# Patient Record
Sex: Male | Born: 1955
Health system: Southern US, Community
[De-identification: ages and names within clinical notes are randomized; demographics above are authoritative.]

## PROBLEM LIST (undated history)

## (undated) DIAGNOSIS — J479 Bronchiectasis, uncomplicated: Secondary | ICD-10-CM

## (undated) DIAGNOSIS — I700 Atherosclerosis of aorta: Secondary | ICD-10-CM

## (undated) DIAGNOSIS — E785 Hyperlipidemia, unspecified: Secondary | ICD-10-CM

## (undated) DIAGNOSIS — G4730 Sleep apnea, unspecified: Secondary | ICD-10-CM

## (undated) DIAGNOSIS — J302 Other seasonal allergic rhinitis: Secondary | ICD-10-CM

## (undated) DIAGNOSIS — K219 Gastro-esophageal reflux disease without esophagitis: Secondary | ICD-10-CM

## (undated) DIAGNOSIS — L570 Actinic keratosis: Secondary | ICD-10-CM

## (undated) DIAGNOSIS — E119 Type 2 diabetes mellitus without complications: Secondary | ICD-10-CM

## (undated) DIAGNOSIS — I2510 Atherosclerotic heart disease of native coronary artery without angina pectoris: Secondary | ICD-10-CM

## (undated) DIAGNOSIS — I10 Essential (primary) hypertension: Secondary | ICD-10-CM

## (undated) DIAGNOSIS — Z860101 Personal history of adenomatous and serrated colon polyps: Secondary | ICD-10-CM

## (undated) DIAGNOSIS — T7840XA Allergy, unspecified, initial encounter: Secondary | ICD-10-CM

## (undated) DIAGNOSIS — M5430 Sciatica, unspecified side: Secondary | ICD-10-CM

## (undated) DIAGNOSIS — K2970 Gastritis, unspecified, without bleeding: Secondary | ICD-10-CM

## (undated) DIAGNOSIS — C801 Malignant (primary) neoplasm, unspecified: Secondary | ICD-10-CM

## (undated) DIAGNOSIS — M1990 Unspecified osteoarthritis, unspecified site: Secondary | ICD-10-CM

## (undated) HISTORY — PX: CHOLECYSTECTOMY: SHX55

## (undated) HISTORY — PX: OTHER SURGICAL HISTORY: SHX169

## (undated) HISTORY — DX: Type 2 diabetes mellitus without complications: E11.9

## (undated) HISTORY — DX: Allergy, unspecified, initial encounter: T78.40XA

## (undated) HISTORY — PX: ESOPHAGOGASTRODUODENOSCOPY: SHX1529

## (undated) HISTORY — PX: CARDIAC CATHETERIZATION: SHX172

## (undated) HISTORY — DX: Actinic keratosis: L57.0

## (undated) HISTORY — PX: VASECTOMY: SHX75

## (undated) HISTORY — PX: HERNIA REPAIR: SHX51

---

## 2009-01-25 ENCOUNTER — Observation Stay: Payer: Self-pay | Admitting: Internal Medicine

## 2009-05-12 ENCOUNTER — Ambulatory Visit: Payer: Self-pay | Admitting: Internal Medicine

## 2009-06-02 ENCOUNTER — Ambulatory Visit: Payer: Self-pay | Admitting: Surgery

## 2009-06-09 ENCOUNTER — Ambulatory Visit: Payer: Self-pay | Admitting: Surgery

## 2010-06-08 DIAGNOSIS — C4492 Squamous cell carcinoma of skin, unspecified: Secondary | ICD-10-CM

## 2010-06-08 HISTORY — DX: Squamous cell carcinoma of skin, unspecified: C44.92

## 2010-08-24 ENCOUNTER — Emergency Department: Payer: Self-pay | Admitting: Emergency Medicine

## 2011-12-26 ENCOUNTER — Encounter: Payer: Self-pay | Admitting: Physician Assistant

## 2011-12-27 ENCOUNTER — Encounter: Payer: Self-pay | Admitting: Physician Assistant

## 2012-06-21 ENCOUNTER — Ambulatory Visit: Payer: Self-pay | Admitting: Unknown Physician Specialty

## 2012-06-25 LAB — PATHOLOGY REPORT

## 2015-03-04 NOTE — Patient Outreach (Signed)
[place] [facility]) Care Management  03/04/2015  TORIN MODICA 1956-04-06 [id number]   A1C drawn on 12/16/14 at [facility]- 6.8%.    Gentry Fitz, RN, BA, [place], [place] Direct Dial:  [phone]  Fax:  [phone] E-mail: [email or web address] [address], [place], [place]  [address]

## 2015-04-27 ENCOUNTER — Telehealth: Payer: Self-pay

## 2015-05-07 ENCOUNTER — Other Ambulatory Visit: Payer: Self-pay | Admitting: Pharmacist

## 2015-05-07 ENCOUNTER — Encounter: Payer: Self-pay | Admitting: Pharmacist

## 2015-05-07 NOTE — Patient Outreach (Signed)
[place] [place] [facility]) Care Management  [place]   05/07/2015  Danny Maldonado 28-Jan-1956 [id number]  Subjective: Danny Maldonado is a 59 year old male who is participating in the Employee Link to [employer].  He presents for his 6 month follow up.  He reports he is currently not following any meal plan or special diet.  He reports a diet high in carbohydrates (hash browns, french fries, chips, pasta, chocolate).  His current exercise regimen consists of walking 2 laps in the park (1.5 miles) four days per week.  He is currently walking this distance in 32 to 35 minutes.  His goal is to increase walking to 3 laps (2.25 miles) per day; however, he first wants to be able to walk 2 laps in 30 minutes or less.  His current diabetes regimen includes metformin 1000 mg BID and glimepiride 2 mg daily.  Patient monitors his BG twice daily.  He denies any hypoglycemic episodes.  Patient presents to appointment with his glucometer today.  His 30-day BG average is 134mg /dL.  Patient also continues on daily ASA and ACE inhibitor.  He is not on a statin.  Patient states his PCP has discussed statin therapy before, but patient reports his lipid panel is well controlled.  Discussed the benefit of statin therapy in ASCVD risk reduction.  Most recent MD follow-up was in August 2016.  Next PCP appointment scheduled in February 2017.    Objective:  Blood Pressure: 120/80  Current Medications: Current Outpatient Prescriptions  Medication Sig Dispense Refill  . acetaminophen (TYLENOL) 500 MG tablet Take 1,000 mg by mouth every 6 (six) hours as needed (PRN - takes approximately once per week).    Marland Kitchen aspirin EC 81 MG tablet Take 81 mg by mouth daily.    . cetirizine (ZYRTEC) 10 MG tablet Take 10 mg by mouth daily.    . Cyanocobalamin 500 MCG SUBL Place 1,000 mcg under the tongue daily.    . fluticasone (FLONASE) 50 MCG/ACT nasal spray Place 2 sprays into both nostrils daily.    Marland Kitchen glimepiride (AMARYL)  2 MG tablet Take 2 mg by mouth daily with breakfast.    . lisinopril (PRINIVIL,ZESTRIL) 10 MG tablet Take 10 mg by mouth daily.    Marland Kitchen loratadine (CLARITIN) 10 MG tablet Take 10 mg by mouth daily.    . magnesium oxide (MAG-OX) 400 MG tablet Take 400 mg by mouth daily.    . metFORMIN (GLUCOPHAGE) 1000 MG tablet Take 1,000 mg by mouth 2 (two) times daily with a meal.    . Multiple Vitamin (MULTIVITAMIN) tablet Take 1 tablet by mouth daily.    . naproxen sodium (ANAPROX) 220 MG tablet Take 440 mg by mouth daily as needed (PRN - takes approximately once per month).    Marland Kitchen omeprazole (PRILOSEC) 20 MG capsule Take 20 mg by mouth daily.     No current facility-administered medications for this visit.   Functional Status: In your present state of health, do you have any difficulty performing the following activities: 05/07/2015  Hearing? Y  Vision? N  Difficulty concentrating or making decisions? N  Walking or climbing stairs? N  Dressing or bathing? N  Doing errands, shopping? N   Fall/Depression Screening: PHQ 2/9 Scores 05/07/2015  PHQ - 2 Score 0    Assessment: 1.  Diabetes: Most recent A1C was 6.9% which is at goal of less than 7%.  Patient is not currently on statin therapy and may benefit from the addition of  a statin for ASCVD risk reduction.  However, patient does not seem interested in initiation of statin therapy at this time.  Patient is currently exercising approximately 160 minutes per week.  I encouraged patient to continue his walking regimen.  Patient may benefit from reduction in carbohydrate intake.  We discussed diet and healthy carbohydrate choices for diabetic patients.  I encouraged patient to try to work toward reducing the number of carbohydrates in his diet.    Plan: 1.  Patient will continue to take his medications as prescribed.   2.  Danny Maldonado will work toward his goal to walk 2 laps (1.5 miles) in 30 minutes or less.  3.  He will work to reduce the amount of carbohydrates  in his diet by first reducing the amount of chocolate he eats.  4.  Follow-up appointment scheduled for 11/05/15 at [facility] Problem One        Most Recent Value   Care Plan Problem One  Diabetes control    Role Documenting the Problem One  Clinical Pharmacist   Care Plan for Problem One  Active   THN Long Term Goal (31-90 days)  Maintain A1c less than 7% in the next 90 days per patient report.     THN Long Term Goal Start Date  05/07/15   Interventions for Problem One Long Term Goal  Discussed diet and exercise to improve weight and blood sugar.      [facility] CM Care Plan Problem Two        Most Recent Value   Care Plan Problem Two  Exercise   Role Documenting the Problem Two  Clinical Pharmacist   Care Plan for Problem Two  Active   Interventions for Problem Two Long Term Goal   Discussed the importance of exercise in weight management.    THN Long Term Goal (31-90) days  Patient will be able to walk two laps at the park (1.5 miles) in less than 30 minutes in the next 90 days per patient report.    THN Long Term Goal Start Date  05/07/15    [facility] CM Care Plan Problem Three        Most Recent Value   Care Plan Problem Three  Diet   Role Documenting the Problem Three  Clinical Pharmacist   Care Plan for Problem Three  Active   THN Long Term Goal (31-90) days  Patient will work on cutting out or reducing one carbohydrate choice in his diet in the next 90 days per patient report.    THN Long Term Goal Start Date  05/07/15   Interventions for Problem Three Long Term Goal  Discussed importance of diet on weight management and blood glucose control.  Patient will work toward reducing the amount of chocolate he eats.       Elisabeth Most, Pharm.D. Pharmacy Resident [place]

## 2015-05-10 ENCOUNTER — Encounter: Payer: Self-pay | Admitting: Pharmacist

## 2015-09-08 DIAGNOSIS — Z1283 Encounter for screening for malignant neoplasm of skin: Secondary | ICD-10-CM | POA: Diagnosis not present

## 2015-09-08 DIAGNOSIS — D229 Melanocytic nevi, unspecified: Secondary | ICD-10-CM | POA: Diagnosis not present

## 2015-09-08 DIAGNOSIS — Z85828 Personal history of other malignant neoplasm of skin: Secondary | ICD-10-CM | POA: Diagnosis not present

## 2015-09-08 DIAGNOSIS — D1800 Hemangioma unspecified site: Secondary | ICD-10-CM | POA: Diagnosis not present

## 2015-09-08 DIAGNOSIS — L821 Other seborrheic keratosis: Secondary | ICD-10-CM | POA: Diagnosis not present

## 2015-09-08 DIAGNOSIS — L304 Erythema intertrigo: Secondary | ICD-10-CM | POA: Diagnosis not present

## 2015-09-08 DIAGNOSIS — L853 Xerosis cutis: Secondary | ICD-10-CM | POA: Diagnosis not present

## 2015-09-08 DIAGNOSIS — L918 Other hypertrophic disorders of the skin: Secondary | ICD-10-CM | POA: Diagnosis not present

## 2015-09-08 DIAGNOSIS — L812 Freckles: Secondary | ICD-10-CM | POA: Diagnosis not present

## 2015-09-27 DIAGNOSIS — E119 Type 2 diabetes mellitus without complications: Secondary | ICD-10-CM | POA: Diagnosis not present

## 2015-09-27 DIAGNOSIS — E538 Deficiency of other specified B group vitamins: Secondary | ICD-10-CM | POA: Diagnosis not present

## 2015-09-27 DIAGNOSIS — E7800 Pure hypercholesterolemia, unspecified: Secondary | ICD-10-CM | POA: Diagnosis not present

## 2015-09-27 DIAGNOSIS — Z79899 Other long term (current) drug therapy: Secondary | ICD-10-CM | POA: Diagnosis not present

## 2015-10-04 DIAGNOSIS — M79601 Pain in right arm: Secondary | ICD-10-CM | POA: Diagnosis not present

## 2015-10-04 DIAGNOSIS — M546 Pain in thoracic spine: Secondary | ICD-10-CM | POA: Diagnosis not present

## 2015-10-04 DIAGNOSIS — I10 Essential (primary) hypertension: Secondary | ICD-10-CM | POA: Diagnosis not present

## 2015-10-04 DIAGNOSIS — R202 Paresthesia of skin: Secondary | ICD-10-CM | POA: Diagnosis not present

## 2015-10-04 DIAGNOSIS — M79602 Pain in left arm: Secondary | ICD-10-CM | POA: Diagnosis not present

## 2015-10-04 DIAGNOSIS — E7800 Pure hypercholesterolemia, unspecified: Secondary | ICD-10-CM | POA: Diagnosis not present

## 2015-10-04 DIAGNOSIS — E119 Type 2 diabetes mellitus without complications: Secondary | ICD-10-CM | POA: Diagnosis not present

## 2015-10-13 DIAGNOSIS — R55 Syncope and collapse: Secondary | ICD-10-CM | POA: Diagnosis not present

## 2015-11-05 ENCOUNTER — Ambulatory Visit: Payer: Self-pay | Admitting: Pharmacist

## 2015-12-03 ENCOUNTER — Encounter: Payer: Self-pay | Admitting: Pharmacist

## 2015-12-03 ENCOUNTER — Other Ambulatory Visit: Payer: Self-pay | Admitting: Pharmacist

## 2015-12-03 NOTE — Patient Outreach (Signed)
[place] [facility]) Care Management  [place]   12/03/2015  BAYRON DALTO 1956/06/04 [id number]  Subjective: Patient presents today for 6 month diabetes follow-up as part of the employer-sponsored Link to Wellness program.  Current diabetes regimen includes metformin and glimepiride.  Patient reports he is not currently following any meal plan or special diet.  He continues to report a diet high in carbohydrates (tater tots, hash browns, pasta, chocolate).  He reports exercising 3 days per week by walking two laps around a track (total distance ~1.5 miles).  Patient reports monitoring blood glucose twice per day.  Patient presents to visit without glucometer, but reports average FBG of 150 - 160 mg/dL and average 2 hour PPG of 130 to 160 mg/dL.  Patient denies hypoglycemia.  Patient also continues on daily ASA and ACE Inhibitor.  Patient is not currently on a statin.  Most recent MD follow-up was 10/04/15.  Patient has a pending appt for 04/05/16.  No med changes or major health changes at this time.   Objective:  Blood pressure = 129/81  Encounter Medications: Outpatient Encounter Prescriptions as of 12/03/2015  Medication Sig  . acetaminophen (TYLENOL) 500 MG tablet Take 1,000 mg by mouth every 6 (six) hours as needed (PRN - takes approximately once per week).  Marland Kitchen aspirin EC 81 MG tablet Take 81 mg by mouth daily.  . cetirizine (ZYRTEC) 10 MG tablet Take 10 mg by mouth daily.  . Cyanocobalamin 500 MCG SUBL Place 1,000 mcg under the tongue daily.  . fluticasone (FLONASE) 50 MCG/ACT nasal spray Place 2 sprays into both nostrils daily.  Marland Kitchen glimepiride (AMARYL) 2 MG tablet Take 2 mg by mouth daily with breakfast.  . lisinopril (PRINIVIL,ZESTRIL) 10 MG tablet Take 10 mg by mouth daily.  Marland Kitchen loratadine (CLARITIN) 10 MG tablet Take 10 mg by mouth daily.  . magnesium oxide (MAG-OX) 400 MG tablet Take 400 mg by mouth daily.  . metFORMIN (GLUCOPHAGE) 1000 MG tablet Take 1,000 mg by mouth  2 (two) times daily with a meal.  . Multiple Vitamin (MULTIVITAMIN) tablet Take 1 tablet by mouth daily.  Marland Kitchen omeprazole (PRILOSEC) 20 MG capsule Take 20 mg by mouth daily.  . [DISCONTINUED] naproxen sodium (ANAPROX) 220 MG tablet Take 440 mg by mouth daily as needed (PRN - takes approximately once per month). Reported on 12/03/2015   No facility-administered encounter medications on file as of 12/03/2015.   Functional Status: In your present state of health, do you have any difficulty performing the following activities: 12/03/2015 05/07/2015  Hearing? N Y  Vision? N N  Difficulty concentrating or making decisions? N N  Walking or climbing stairs? N N  Dressing or bathing? N N  Doing errands, shopping? N N   Fall/Depression Screening: PHQ 2/9 Scores 12/03/2015 05/07/2015  PHQ - 2 Score 0 0    Assessment: 1.  Diabetes: Most recent A1C was 6.9% which is at goal of less than 7%. Weight is stable from last visit with me.  Patient reports adherence with diabetes medications.  Patient denies hypoglycemic events and is able to verbalize appropriate hypoglycemia management plan.    Discussed importance of diet and exercise in diabetes management.  Patient reports exercising 3 days per week for approximately 30 to 35 minutes per day.  Encouraged patient to continue to exercise and aim for a goal of 150 minutes per week.  Patient may benefit from reduction in carbohydrate intake.  We discussed diet and healthy carbohydrate choices for diabetic  patients.  I encouraged patient to try to work toward reducing the number of carbohydrates in his diet.    Patient has 10 year ASCVD risk of 17.5%.  High intensity statin therapy indicated due to diabetes and ASCVD risk greater than 7.5%.    Plan: 1.  Patient will continue to take his medications as prescribed.   2.  Patient will continue to walk three days per week with a goal to exercise at least 150 minutes per week.  3.  Patient will work to reduce the amount of  carbohydrates in his diet by working on [employer].  4.  I will send recommendation to patient's PCP to consider initiation of high intensity statin therapy if clinically appropriate.  4.  Follow up appointment scheduled for 06/02/16.    THN CM Care Plan Problem One        Most Recent Value   Care Plan Problem One  Diabetes control    Role Documenting the Problem One  Clinical Pharmacist   Care Plan for Problem One  Active   THN Long Term Goal (31-90 days)  Maintain A1c less than 7% in the next 90 days per patient report.     THN Long Term Goal Start Date  05/07/15   THN Long Term Goal Met Date  12/03/15   Interventions for Problem One Long Term Goal  Most recent A1c on 09/27/15 was stable at 6.9%    [facility] CM Care Plan Problem Two        Most Recent Value   Care Plan Problem Two  Exercise   Role Documenting the Problem Two  Clinical Pharmacist   Care Plan for Problem Two  Active   Interventions for Problem Two Long Term Goal   Patient continues to walk around the track two laps three days per week. Discussed [name] recommendations to exercise 150 mintues per week.    THN Long Term Goal (31-90) days  Patient will exercise 150 minutes per week in the next 90 days per patient report.    THN Long Term Goal Start Date  12/03/15   THN Long Term Goal Met Date  12/03/15    [facility] CM Care Plan Problem Three        Most Recent Value   Care Plan Problem Three  Diet   Role Documenting the Problem Three  Clinical Pharmacist   Care Plan for Problem Three  Active   THN Long Term Goal (31-90) days  Patient will work on cutting out or reducing one carbohydrate choice in his diet in the next 90 days per patient report.    THN Long Term Goal Start Date  05/07/15   THN Long Term Goal Met Date  -- [goal renewed]   Interventions for Problem Three Long Term Goal  Discuss importance of diet on weight management and blood glucose control.  Patient will work toward reducing carbohydrates in diet.        Elisabeth Most, Pharm.D. Pharmacy Resident [place] [phone]

## 2016-04-05 DIAGNOSIS — Z79899 Other long term (current) drug therapy: Secondary | ICD-10-CM | POA: Diagnosis not present

## 2016-04-05 DIAGNOSIS — R739 Hyperglycemia, unspecified: Secondary | ICD-10-CM | POA: Diagnosis not present

## 2016-04-05 DIAGNOSIS — Z125 Encounter for screening for malignant neoplasm of prostate: Secondary | ICD-10-CM | POA: Diagnosis not present

## 2016-04-05 DIAGNOSIS — E7800 Pure hypercholesterolemia, unspecified: Secondary | ICD-10-CM | POA: Diagnosis not present

## 2016-04-05 DIAGNOSIS — I10 Essential (primary) hypertension: Secondary | ICD-10-CM | POA: Diagnosis not present

## 2016-04-24 DIAGNOSIS — E119 Type 2 diabetes mellitus without complications: Secondary | ICD-10-CM | POA: Diagnosis not present

## 2016-04-24 DIAGNOSIS — Z0000 Encounter for general adult medical examination without abnormal findings: Secondary | ICD-10-CM | POA: Diagnosis not present

## 2016-04-24 DIAGNOSIS — I10 Essential (primary) hypertension: Secondary | ICD-10-CM | POA: Diagnosis not present

## 2016-05-10 DIAGNOSIS — R55 Syncope and collapse: Secondary | ICD-10-CM | POA: Diagnosis not present

## 2016-05-10 DIAGNOSIS — Z7689 Persons encountering health services in other specified circumstances: Secondary | ICD-10-CM | POA: Diagnosis not present

## 2016-05-10 DIAGNOSIS — I10 Essential (primary) hypertension: Secondary | ICD-10-CM | POA: Diagnosis not present

## 2016-05-10 DIAGNOSIS — Z9889 Other specified postprocedural states: Secondary | ICD-10-CM | POA: Insufficient documentation

## 2016-05-10 DIAGNOSIS — E7800 Pure hypercholesterolemia, unspecified: Secondary | ICD-10-CM | POA: Diagnosis not present

## 2016-05-15 DIAGNOSIS — I10 Essential (primary) hypertension: Secondary | ICD-10-CM | POA: Diagnosis not present

## 2016-05-15 DIAGNOSIS — E119 Type 2 diabetes mellitus without complications: Secondary | ICD-10-CM | POA: Diagnosis not present

## 2016-05-15 DIAGNOSIS — R55 Syncope and collapse: Secondary | ICD-10-CM | POA: Diagnosis not present

## 2016-05-15 DIAGNOSIS — E7800 Pure hypercholesterolemia, unspecified: Secondary | ICD-10-CM | POA: Diagnosis not present

## 2016-05-19 DIAGNOSIS — Z23 Encounter for immunization: Secondary | ICD-10-CM | POA: Diagnosis not present

## 2016-05-29 DIAGNOSIS — R55 Syncope and collapse: Secondary | ICD-10-CM | POA: Diagnosis not present

## 2016-06-01 DIAGNOSIS — G4733 Obstructive sleep apnea (adult) (pediatric): Secondary | ICD-10-CM | POA: Diagnosis not present

## 2016-06-02 ENCOUNTER — Ambulatory Visit: Payer: 59 | Admitting: Pharmacist

## 2016-06-16 ENCOUNTER — Other Ambulatory Visit: Payer: Self-pay | Admitting: Pharmacist

## 2016-06-16 ENCOUNTER — Encounter: Payer: Self-pay | Admitting: Pharmacist

## 2016-06-16 NOTE — Patient Outreach (Signed)
[facility] [facility]) Care Management  [place]   06/16/2016  TEION BALLIN [birth year] [id number]  Subjective: Patient presents today for diabetes follow-up as part of the employer-sponsored Link to Wellness program.  Current diabetes regimen includes metformin 1000 mg BID and glimepiride 2 mg daily.  Patient also continues on daily aspirin and lisinopril.  Most recent MD follow-up was 03/2016.  He does report 2 episodes where had dizziness, lightheaded and disoriented while walking but his blood sugars were not low and his blood pressure was also in normal range. Dr Doy Hutching has sent him to a cardiologist with a stress test and has found no issues.   Patient reported dietary habits: Eats 3 meals/day Breakfast:hashbrown with sausage or eggs Lunch:pasta or pizza Dinner: Varies - hamburger without the bread or baked breaded fish or tacos or meatloaf.  Sides usually mashed potatoes or mac and cheese or green beans. He does not like most cooked vegetables.  Snacks: occasional chocolate or cheese or cantalope Drinks: water or unsweet tea  Patient reported exercise habits: Walking 30 minutes (two 3/4 mile laps) per day for 2-3 days per week. Doesn't like to walk without his wife being there.  Does state that his back begins to hurt sometimes when walking.   Patient denies hypoglycemic events.  Patient reports nocturia 1 time per night.  Patient denies pain/burning upon urination.  Patient denies neuropathy. Patient denies visual changes. Has not seen eye doctor in 2017.  Patient denies self foot exams. Denies changes.  Last saw podiatry in 2016 and now has custom made orthotics.   Patient reported self monitored blood glucose frequency 2 times per day  Home fasting CBG: 30 day average 140 mg/dL  2 hour post-prandial/random CBG: 141 mg/dL   Objective:  Vitals:   06/16/16 1500  BP: 132/72  Pulse: 78   Hemoglobin A1C  04/05/2016 [place] Component  Name Value Ref Range  Hemoglobin A1C 7.1 (H) 4.2 - 5.6 %    Hemoglobin A1C (09/27/2015 7:06 AM) Hemoglobin A1C (09/27/2015 7:06 AM)  Component Value Ref Range  Hemoglobin A1C 6.9 (H) 4.2 - 5.6 %  Average Blood Glucose (Calc) 151 mg/dL   10 year ASCVD risk: 19.6%  Encounter Medications: Outpatient Encounter Prescriptions as of 06/16/2016  Medication Sig  . acetaminophen (TYLENOL) 500 MG tablet Take 1,000 mg by mouth every 6 (six) hours as needed (PRN - takes approximately once per week).  Marland Kitchen aspirin EC 81 MG tablet Take 81 mg by mouth daily.  . cetirizine (ZYRTEC) 10 MG tablet Take 10 mg by mouth every evening.   . Cyanocobalamin 500 MCG SUBL Place 1,000 mcg under the tongue daily.  . fluticasone (FLONASE) 50 MCG/ACT nasal spray Place 2 sprays into both nostrils daily.  Marland Kitchen glimepiride (AMARYL) 2 MG tablet Take 2 mg by mouth daily with breakfast.  . lisinopril (PRINIVIL,ZESTRIL) 10 MG tablet Take 10 mg by mouth daily.  Marland Kitchen loratadine (CLARITIN) 10 MG tablet Take 10 mg by mouth every morning.   . magnesium oxide (MAG-OX) 400 MG tablet Take 400 mg by mouth daily.  . metFORMIN (GLUCOPHAGE) 1000 MG tablet Take 1,000 mg by mouth 2 (two) times daily with a meal.  . Multiple Vitamin (MULTIVITAMIN) tablet Take 1 tablet by mouth daily.  Marland Kitchen omeprazole (PRILOSEC) 20 MG capsule Take 20 mg by mouth daily.   No facility-administered encounter medications on file as of 06/16/2016.     Functional Status: In your present state of health, do  you have any difficulty performing the following activities: 12/03/2015  Hearing? N  Vision? N  Difficulty concentrating or making decisions? N  Walking or climbing stairs? N  Dressing or bathing? N  Doing errands, shopping? N  Some recent data might be hidden    Fall/Depression Screening: PHQ 2/9 Scores 06/16/2016 12/03/2015 05/07/2015  PHQ - 2 Score 1 0 0     Assessment:  Diabetes: Most recent A1C was 7.1% which is above goal of less than 7%. Currently on  Aspirin and lisinopril. ASCVD 10 year risk of 19.6% and not currently on a statin with last LDL of 91 on 04/05/16.    Plan/Goals for Next Visit: -Encouraged patient to schedule yearly eye exam.  -Discussed low carbohydrate snacks and provided [name] Smart Snacks handout -Patient set goal to continue walking at least 3 days a week for 30 minutes or increase to a third lap around the track slowly as tolerated as measured by patient report -Will contact Dr Doy Hutching to consider a moderate-high intensity statin given patients diabetes and ASCVD 10 year risk of 19.6%.    Next appointment to see me is: 3-4 months    Bennye Alm, PharmD [facility] PGY2 Pharmacy Resident [phone]  [facility] CM Care Plan Problem Two   Flowsheet Row Most Recent Value  Care Plan Problem Two  Exercise  Role Documenting the Problem Two  Clinical Pharmacist  Care Plan for Problem Two  Active  Interventions for Problem Two Long Term Goal   Patient continues to walk around the track two laps three days per week. Discussed [name] recommendations to exercise 150 mintues per week.   THN Long Term Goal (31-90) days  Patient to continue walking at least 3 days a week for 30 minutes or increase to a third lap around the track slowly as tolerated as measured by patient report  [place] Term Goal Start Date  06/16/16    [facility] CM Care Plan Problem Three   Flowsheet Row Most Recent Value  Care Plan Problem Three  Diet  Role Documenting the Problem Three  Clinical Pharmacist  Care Plan for Problem Three  Active  THN Long Term Goal (31-90) days  Patient will work on cutting out or reducing one carbohydrate choice in his diet in the next 90 days per patient report.   THN Long Term Goal Start Date  05/07/15  THN Long Term Goal Met Date  -- [goal renewed]  Interventions for Problem Three Long Term Goal  Discuss importance of diet on weight management and blood glucose control.  Patient will work toward reducing carbohydrates in diet.

## 2016-06-27 DIAGNOSIS — I10 Essential (primary) hypertension: Secondary | ICD-10-CM | POA: Insufficient documentation

## 2016-06-27 DIAGNOSIS — G4730 Sleep apnea, unspecified: Secondary | ICD-10-CM | POA: Insufficient documentation

## 2016-06-27 DIAGNOSIS — E119 Type 2 diabetes mellitus without complications: Secondary | ICD-10-CM | POA: Insufficient documentation

## 2016-06-27 DIAGNOSIS — M1990 Unspecified osteoarthritis, unspecified site: Secondary | ICD-10-CM | POA: Insufficient documentation

## 2016-06-27 DIAGNOSIS — E785 Hyperlipidemia, unspecified: Secondary | ICD-10-CM | POA: Insufficient documentation

## 2016-07-25 DIAGNOSIS — E119 Type 2 diabetes mellitus without complications: Secondary | ICD-10-CM | POA: Diagnosis not present

## 2016-07-25 DIAGNOSIS — I10 Essential (primary) hypertension: Secondary | ICD-10-CM | POA: Diagnosis not present

## 2016-07-25 DIAGNOSIS — H524 Presbyopia: Secondary | ICD-10-CM | POA: Diagnosis not present

## 2016-07-25 DIAGNOSIS — H2513 Age-related nuclear cataract, bilateral: Secondary | ICD-10-CM | POA: Diagnosis not present

## 2016-07-25 DIAGNOSIS — H5203 Hypermetropia, bilateral: Secondary | ICD-10-CM | POA: Diagnosis not present

## 2016-07-25 DIAGNOSIS — H52223 Regular astigmatism, bilateral: Secondary | ICD-10-CM | POA: Diagnosis not present

## 2016-09-11 DIAGNOSIS — I10 Essential (primary) hypertension: Secondary | ICD-10-CM | POA: Diagnosis not present

## 2016-09-11 DIAGNOSIS — M7711 Lateral epicondylitis, right elbow: Secondary | ICD-10-CM | POA: Diagnosis not present

## 2016-09-19 DIAGNOSIS — E7800 Pure hypercholesterolemia, unspecified: Secondary | ICD-10-CM | POA: Diagnosis not present

## 2016-09-19 DIAGNOSIS — I10 Essential (primary) hypertension: Secondary | ICD-10-CM | POA: Diagnosis not present

## 2016-09-19 DIAGNOSIS — G4733 Obstructive sleep apnea (adult) (pediatric): Secondary | ICD-10-CM | POA: Diagnosis not present

## 2016-10-16 DIAGNOSIS — E119 Type 2 diabetes mellitus without complications: Secondary | ICD-10-CM | POA: Diagnosis not present

## 2016-10-16 DIAGNOSIS — Z79899 Other long term (current) drug therapy: Secondary | ICD-10-CM | POA: Diagnosis not present

## 2016-10-16 DIAGNOSIS — E7800 Pure hypercholesterolemia, unspecified: Secondary | ICD-10-CM | POA: Diagnosis not present

## 2016-10-23 DIAGNOSIS — E7800 Pure hypercholesterolemia, unspecified: Secondary | ICD-10-CM | POA: Diagnosis not present

## 2016-10-23 DIAGNOSIS — E119 Type 2 diabetes mellitus without complications: Secondary | ICD-10-CM | POA: Diagnosis not present

## 2016-10-23 DIAGNOSIS — I10 Essential (primary) hypertension: Secondary | ICD-10-CM | POA: Diagnosis not present

## 2016-10-23 DIAGNOSIS — R739 Hyperglycemia, unspecified: Secondary | ICD-10-CM | POA: Diagnosis not present

## 2016-10-25 DIAGNOSIS — Z85828 Personal history of other malignant neoplasm of skin: Secondary | ICD-10-CM | POA: Diagnosis not present

## 2016-10-25 DIAGNOSIS — L578 Other skin changes due to chronic exposure to nonionizing radiation: Secondary | ICD-10-CM | POA: Diagnosis not present

## 2016-10-25 DIAGNOSIS — D229 Melanocytic nevi, unspecified: Secondary | ICD-10-CM | POA: Diagnosis not present

## 2016-10-25 DIAGNOSIS — L820 Inflamed seborrheic keratosis: Secondary | ICD-10-CM | POA: Diagnosis not present

## 2016-10-25 DIAGNOSIS — L304 Erythema intertrigo: Secondary | ICD-10-CM | POA: Diagnosis not present

## 2016-10-25 DIAGNOSIS — L812 Freckles: Secondary | ICD-10-CM | POA: Diagnosis not present

## 2016-10-25 DIAGNOSIS — Z1283 Encounter for screening for malignant neoplasm of skin: Secondary | ICD-10-CM | POA: Diagnosis not present

## 2016-10-25 DIAGNOSIS — L918 Other hypertrophic disorders of the skin: Secondary | ICD-10-CM | POA: Diagnosis not present

## 2016-10-25 DIAGNOSIS — L821 Other seborrheic keratosis: Secondary | ICD-10-CM | POA: Diagnosis not present

## 2016-11-01 DIAGNOSIS — M25521 Pain in right elbow: Secondary | ICD-10-CM | POA: Diagnosis not present

## 2016-11-01 DIAGNOSIS — M7711 Lateral epicondylitis, right elbow: Secondary | ICD-10-CM | POA: Diagnosis not present

## 2016-11-03 ENCOUNTER — Ambulatory Visit: Payer: Self-pay | Admitting: Pharmacist

## 2016-11-17 ENCOUNTER — Encounter: Payer: Self-pay | Admitting: Pharmacist

## 2016-11-17 ENCOUNTER — Other Ambulatory Visit: Payer: Self-pay | Admitting: Pharmacist

## 2016-11-17 NOTE — Patient Outreach (Signed)
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  Current diabetes regimen includes metformin 1000 mg twice daily, glimepiride 2 mg daily.  Patient also continues on daily aspirin, and lisinopril (but no current statin per his discussion with MD).  Most recent MD follow-up was 10/23/16 with Dr Doy Hutching.  Patient has a pending appt for 6 months. He did receive a steroid injection which was on March 7th.    He is having some issues with his [employer] application syncing with his CBG meter but he has been manually entering CBGs.   He does have some frustration with how long it takes to sync his CBGs.  He reports the application is helping him lose weight and stay engaged with himself.    Patient reported dietary habits: Currently eating 3 meals per day.  Reports adherence to low carbohydrate diet 60% of the time.    Patient reported exercise habits: 4000-5000 steps per day for ~4-5 days per week.  Mostly walking (walking has been limited due to cold but has started walking at [employer]). 2 miles in 38 minutes of walking.    Patient denies hypoglycemic events. 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  Patient reports nocturia 1 time per night (decreased over past couple weeks).  Patient denies pain/burning upon urination.  Patient reports neuropathy in hands occasionally. Patient denies visual changes. Last Eye Exam 05/2016 Patient reports self foot exams. Denies changes.    Patient reported self monitored blood glucose frequency twice daily.  Home fasting CBG: 111 mg/dL.   Most values ranging from 100-130 mg/dL    Objective:  There were no vitals filed for this visit.  Hemoglobin A1C 10/16/2016   [place] Component Name Value Ref Range  Hemoglobin A1C 7.1 (H) 4.2 - 5.6 %   10/16/2016 Lipid Panel Total Cholesterol 165 Triglyceride 157 HDL 38.6  LDL 95 VLDL 31  Encounter Medications: Outpatient Encounter Prescriptions as of 11/17/2016  Medication Sig  . acetaminophen (TYLENOL) 500 MG tablet Take 1,000 mg by mouth every 6 (six) hours as needed (PRN - takes approximately once per week).  Marland Kitchen aspirin EC 81 MG tablet Take 81 mg by mouth daily.  . cetirizine (ZYRTEC) 10 MG tablet Take 10 mg by mouth every evening.   . Cyanocobalamin 500 MCG SUBL Place 1,000 mcg under the tongue daily.  . fluticasone (FLONASE) 50 MCG/ACT nasal spray Place 2 sprays into both nostrils daily.  Marland Kitchen glimepiride (AMARYL) 2 MG tablet Take 2 mg by mouth daily with breakfast.  . lisinopril (PRINIVIL,ZESTRIL) 10 MG tablet Take 10 mg by mouth daily.  Marland Kitchen loratadine (CLARITIN) 10 MG tablet Take 10 mg by mouth every morning.   . magnesium oxide (MAG-OX) 400 MG tablet Take 400 mg by mouth daily.  . meloxicam (MOBIC) 15 MG tablet Take 15 mg by mouth daily.  . metFORMIN (GLUCOPHAGE) 1000 MG tablet Take 1,000 mg by mouth 2 (two) times daily with a meal.  . Multiple Vitamin (MULTIVITAMIN) tablet Take 1 tablet by mouth daily.  Marland Kitchen omeprazole (PRILOSEC) 20 MG capsule Take 20 mg by mouth daily.   No facility-administered encounter medications on file as of 11/17/2016.     Functional Status: In your present state of health, do you have  any difficulty performing the following activities: 12/03/2015  Hearing? N  Vision? N  Difficulty concentrating or making decisions? N  Walking or climbing stairs? N  Dressing or bathing? N  Doing errands, shopping? N  Some recent data might be hidden    Fall/Depression Screening: PHQ 2/9 Scores 11/17/2016 06/16/2016 12/03/2015 05/07/2015  PHQ - 2 Score 0 1 0 0     Assessment: Diabetes: Most recent A1C was 7.1% which is slightly above goal of less than 7%. Weight is  decreased from last visit with me.    Plan/Goals for Next Visit: Discussed signs/symptoms/treatment of hypoglycemia Discussed increasing exercise and low carbohydrate diet.  Patient will transition from Link to Wellness program with Gentry Fitz as Agricultural engineer.    Next appointment to see me is: Will transition to new employee sponsored [employer] Diabetes program   Bennye Alm, PharmD [facility] PGY2 Pharmacy Resident [phone]

## 2016-12-18 DIAGNOSIS — M222X1 Patellofemoral disorders, right knee: Secondary | ICD-10-CM | POA: Diagnosis not present

## 2016-12-18 DIAGNOSIS — M25561 Pain in right knee: Secondary | ICD-10-CM | POA: Diagnosis not present

## 2017-01-19 DIAGNOSIS — M222X1 Patellofemoral disorders, right knee: Secondary | ICD-10-CM | POA: Diagnosis not present

## 2017-02-27 ENCOUNTER — Encounter: Payer: Self-pay | Admitting: *Deleted

## 2017-02-27 ENCOUNTER — Emergency Department
Admission: EM | Admit: 2017-02-27 | Discharge: 2017-02-28 | Disposition: A | Discharge status: Home / Self Care | Payer: 59 | Attending: Emergency Medicine | Admitting: Emergency Medicine

## 2017-02-27 DIAGNOSIS — K2900 Acute gastritis without bleeding: Secondary | ICD-10-CM | POA: Diagnosis not present

## 2017-02-27 DIAGNOSIS — Z7984 Long term (current) use of oral hypoglycemic drugs: Secondary | ICD-10-CM | POA: Insufficient documentation

## 2017-02-27 DIAGNOSIS — R1012 Left upper quadrant pain: Secondary | ICD-10-CM | POA: Diagnosis not present

## 2017-02-27 DIAGNOSIS — E119 Type 2 diabetes mellitus without complications: Secondary | ICD-10-CM | POA: Diagnosis not present

## 2017-02-27 DIAGNOSIS — Z87891 Personal history of nicotine dependence: Secondary | ICD-10-CM | POA: Insufficient documentation

## 2017-02-27 DIAGNOSIS — Z79899 Other long term (current) drug therapy: Secondary | ICD-10-CM | POA: Insufficient documentation

## 2017-02-27 DIAGNOSIS — I10 Essential (primary) hypertension: Secondary | ICD-10-CM | POA: Insufficient documentation

## 2017-02-27 DIAGNOSIS — Z7982 Long term (current) use of aspirin: Secondary | ICD-10-CM | POA: Diagnosis not present

## 2017-02-27 DIAGNOSIS — R1010 Upper abdominal pain, unspecified: Secondary | ICD-10-CM | POA: Diagnosis not present

## 2017-02-27 LAB — CBC
HEMATOCRIT: 41.8 % (ref 40.0–52.0)
HEMOGLOBIN: 13.7 g/dL (ref 13.0–18.0)
MCH: 27.3 pg (ref 26.0–34.0)
MCHC: 32.8 g/dL (ref 32.0–36.0)
MCV: 83 fL (ref 80.0–100.0)
Platelets: 255 10*3/uL (ref 150–440)
RBC: 5.04 MIL/uL (ref 4.40–5.90)
RDW: 14.5 % (ref 11.5–14.5)
WBC: 11.4 10*3/uL — ABNORMAL HIGH (ref 3.8–10.6)

## 2017-02-27 LAB — COMPREHENSIVE METABOLIC PANEL
ALBUMIN: 4 g/dL (ref 3.5–5.0)
ALT: 20 U/L (ref 17–63)
ANION GAP: 10 (ref 5–15)
AST: 26 U/L (ref 15–41)
Alkaline Phosphatase: 56 U/L (ref 38–126)
BUN: 21 mg/dL — ABNORMAL HIGH (ref 6–20)
CO2: 23 mmol/L (ref 22–32)
Calcium: 9.8 mg/dL (ref 8.9–10.3)
Chloride: 104 mmol/L (ref 101–111)
Creatinine, Ser: 1.38 mg/dL — ABNORMAL HIGH (ref 0.61–1.24)
GFR calc Af Amer: >60 mL/min (ref >60)
GFR calc non Af Amer: 53 mL/min — ABNORMAL LOW (ref >60)
GLUCOSE: 233 mg/dL — AB (ref 65–99)
POTASSIUM: 4.9 mmol/L (ref 3.5–5.1)
Sodium: 137 mmol/L (ref 135–145)
Total Bilirubin: 0.5 mg/dL (ref 0.3–1.2)
Total Protein: 7.4 g/dL (ref 6.5–8.1)

## 2017-02-27 LAB — URINALYSIS, COMPLETE (UACMP) WITH MICROSCOPIC
BACTERIA UA: NONE SEEN
Bilirubin Urine: NEGATIVE
Glucose, UA: 150 mg/dL — AB
HGB URINE DIPSTICK: NEGATIVE
Ketones, ur: NEGATIVE mg/dL
Leukocytes, UA: NEGATIVE
NITRITE: NEGATIVE
PROTEIN: NEGATIVE mg/dL
Specific Gravity, Urine: 1.025 (ref 1.005–1.030)
pH: 5 (ref 5.0–8.0)

## 2017-02-27 LAB — LIPASE, BLOOD: Lipase: 45 U/L (ref 11–51)

## 2017-02-27 NOTE — ED Triage Notes (Signed)
Pt has abd pain with nausea.  Sx began today.  No v/d.  No urinary sx.  Pt alert.

## 2017-02-28 ENCOUNTER — Emergency Department: Payer: 59

## 2017-02-28 DIAGNOSIS — R1012 Left upper quadrant pain: Secondary | ICD-10-CM | POA: Diagnosis not present

## 2017-02-28 MED ORDER — FAMOTIDINE 20 MG PO TABS
40.0000 mg | ORAL_TABLET | Freq: Once | ORAL | Status: AC
  Administered 2017-02-28: 40 mg via ORAL
  Filled 2017-02-28: qty 2

## 2017-02-28 MED ORDER — FAMOTIDINE 20 MG PO TABS: 20.0000 mg | ORAL_TABLET | Freq: Two times a day (BID) | ORAL | 0 refills | Status: DC

## 2017-02-28 MED ORDER — SUCRALFATE 1 G PO TABS: 1.0000 g | ORAL_TABLET | Freq: Four times a day (QID) | ORAL | 1 refills | Status: DC

## 2017-02-28 MED ORDER — METOCLOPRAMIDE HCL 10 MG PO TABS: 10.0000 mg | ORAL_TABLET | Freq: Four times a day (QID) | ORAL | 0 refills | Status: DC | PRN

## 2017-02-28 MED ORDER — METOCLOPRAMIDE HCL 10 MG PO TABS
10.0000 mg | ORAL_TABLET | Freq: Once | ORAL | Status: AC
  Administered 2017-02-28: 10 mg via ORAL
  Filled 2017-02-28: qty 1

## 2017-02-28 MED ORDER — GI COCKTAIL ~~LOC~~
30.0000 mL | ORAL | Status: AC
  Administered 2017-02-28: 30 mL via ORAL
  Filled 2017-02-28: qty 30

## 2017-02-28 NOTE — Discharge Instructions (Signed)
Results for orders placed or performed during the hospital encounter of 02/27/17  Lipase, blood  Result Value Ref Range   Lipase 45 11 - 51 U/L  Comprehensive metabolic panel  Result Value Ref Range   Sodium 137 135 - 145 mmol/L   Potassium 4.9 3.5 - 5.1 mmol/L   Chloride 104 101 - 111 mmol/L   CO2 23 22 - 32 mmol/L   Glucose, Bld 233 (H) 65 - 99 mg/dL   BUN 21 (H) 6 - 20 mg/dL   Creatinine, Ser 1.38 (H) 0.61 - 1.24 mg/dL   Calcium 9.8 8.9 - 10.3 mg/dL   Total Protein 7.4 6.5 - 8.1 g/dL   Albumin 4.0 3.5 - 5.0 g/dL   AST 26 15 - 41 U/L   ALT 20 17 - 63 U/L   Alkaline Phosphatase 56 38 - 126 U/L   Total Bilirubin 0.5 0.3 - 1.2 mg/dL   GFR calc non Af Amer 53 (L) >60 mL/min   GFR calc Af Amer >60 >60 mL/min   Anion gap 10 5 - 15  CBC  Result Value Ref Range   WBC 11.4 (H) 3.8 - 10.6 K/uL   RBC 5.04 4.40 - 5.90 MIL/uL   Hemoglobin 13.7 13.0 - 18.0 g/dL   HCT 41.8 40.0 - 52.0 %   MCV 83.0 80.0 - 100.0 fL   MCH 27.3 26.0 - 34.0 pg   MCHC 32.8 32.0 - 36.0 g/dL   RDW 14.5 11.5 - 14.5 %   Platelets 255 150 - 440 K/uL  Urinalysis, Complete w Microscopic  Result Value Ref Range   Color, Urine YELLOW (A) YELLOW   APPearance CLEAR (A) CLEAR   Specific Gravity, Urine 1.025 1.005 - 1.030   pH 5.0 5.0 - 8.0   Glucose, UA 150 (A) NEGATIVE mg/dL   Hgb urine dipstick NEGATIVE NEGATIVE   Bilirubin Urine NEGATIVE NEGATIVE   Ketones, ur NEGATIVE NEGATIVE mg/dL   Protein, ur NEGATIVE NEGATIVE mg/dL   Nitrite NEGATIVE NEGATIVE   Leukocytes, UA NEGATIVE NEGATIVE   RBC / HPF 0-5 0 - 5 RBC/hpf   WBC, UA 0-5 0 - 5 WBC/hpf   Bacteria, UA NONE SEEN NONE SEEN   Squamous Epithelial / LPF 0-5 (A) NONE SEEN   Mucous PRESENT    Dg Abdomen Acute W/chest  Result Date: 02/28/2017 CLINICAL DATA:  Acute onset of left upper quadrant abdominal pain and nausea. Initial encounter. EXAM: DG ABDOMEN ACUTE W/ 1V CHEST COMPARISON:  CT of the abdomen and pelvis performed 08/24/2010 FINDINGS: The lungs are  well-aerated. Vascular congestion is noted. Increased interstitial markings may reflect mild interstitial edema. There is no evidence of pleural effusion or pneumothorax. The cardiomediastinal silhouette is mildly enlarged. The visualized bowel gas pattern is unremarkable. Scattered stool and air are seen within the colon; there is no evidence of small bowel dilatation to suggest obstruction. No free intra-abdominal air is identified on the provided upright view. Clips are noted within the right upper quadrant, reflecting prior cholecystectomy. No acute osseous abnormalities are seen; the sacroiliac joints are unremarkable in appearance. IMPRESSION: 1. Unremarkable bowel gas pattern; no free intra-abdominal air seen. Small to moderate amount of stool noted in the colon. 2. Vascular congestion and mild cardiomegaly. Increased interstitial markings may reflect mild interstitial edema. Electronically Signed   By: Garald Balding M.D.   On: 02/28/2017 00:56

## 2017-02-28 NOTE — ED Provider Notes (Signed)
[facility] Emergency Department Provider Note  ____________________________________________  Time seen: Approximately 2:06 AM  I have reviewed the triage vital signs and the nursing notes.   HISTORY  Chief Complaint Abdominal Pain    HPI Danny Maldonado is a 61 y.o. male who complains of left upper quadrant abdominal pain that started today around lunchtime, associated with dry heaves but no vomiting. No constipation or diarrhea. No aggravating or alleviating factors, nonradiating, aching. He has a history of gastritis diagnosed by EGD and this feels similar. He takes a PPI daily, reports compliance with this medication.     Past Medical History:  Diagnosis Date  . Allergy   . Diabetes mellitus without complication [facility])      Patient Active Problem List   Diagnosis Date Noted  . Diabetes mellitus type 2, uncomplicated ([place]) 65/68/1275  . DJD (degenerative joint disease) 06/27/2016  . Hyperlipidemia, unspecified 06/27/2016  . Hypertension 06/27/2016  . Sleep apnea 06/27/2016  . Syncope and collapse 05/10/2016     No past surgical history on file.   Prior to Admission medications   Medication Sig Start Date End Date Taking? Authorizing Provider  acetaminophen (TYLENOL) 500 MG tablet Take 1,000 mg by mouth every 6 (six) hours as needed (PRN - takes approximately once per week).    [provider]  aspirin EC 81 MG tablet Take 81 mg by mouth daily.    [provider]  cetirizine (ZYRTEC) 10 MG tablet Take 10 mg by mouth every evening.     [provider]  Cyanocobalamin 500 MCG SUBL Place 1,000 mcg under the tongue daily.    [provider]  famotidine (PEPCID) 20 MG tablet Take 1 tablet (20 mg total) by mouth 2 (two) times daily. 02/28/17   Carrie Mew, MD  fluticasone [facility]) 50 MCG/ACT nasal spray Place 2 sprays into both nostrils daily.    [provider]  glimepiride (AMARYL) 2 MG tablet  Take 2 mg by mouth daily with breakfast.    [provider]  lisinopril (PRINIVIL,ZESTRIL) 10 MG tablet Take 10 mg by mouth daily.    [provider]  loratadine (CLARITIN) 10 MG tablet Take 10 mg by mouth every morning.     [provider]  magnesium oxide (MAG-OX) 400 MG tablet Take 400 mg by mouth daily.    [provider]  meloxicam (MOBIC) 15 MG tablet Take 15 mg by mouth daily.    [provider]  metFORMIN (GLUCOPHAGE) 1000 MG tablet Take 1,000 mg by mouth 2 (two) times daily with a meal.    [provider]  metoCLOPramide (REGLAN) 10 MG tablet Take 1 tablet (10 mg total) by mouth every 6 (six) hours as needed. 02/28/17   Carrie Mew, MD  Multiple Vitamin (MULTIVITAMIN) tablet Take 1 tablet by mouth daily.    [provider]  omeprazole (PRILOSEC) 20 MG capsule Take 20 mg by mouth daily.    [provider]  sucralfate (CARAFATE) 1 g tablet Take 1 tablet (1 g total) by mouth 4 (four) times daily. 02/28/17   Carrie Mew, MD     Allergies Patient has no known allergies.   Family History  Problem Relation Age of Onset  . Hypertension Mother   . COPD Father   . Hypertension Father   . Stroke Maternal Grandfather     Social History Social History  Substance Use Topics  . Smoking status: Former Smoker    Quit date: 12/02/1985  .  Smokeless tobacco: Never Used  . Alcohol use No    Review of Systems  Constitutional:   No fever or chills.  ENT:   No sore throat. No rhinorrhea. Cardiovascular:   No chest pain or syncope. Respiratory:   No dyspnea or cough. Gastrointestinal:   Positive as above for abdominal pain without vomiting and diarrhea.  Musculoskeletal:   Negative for focal pain or swelling All other systems reviewed and are negative except as documented above in ROS and HPI.  ____________________________________________   PHYSICAL EXAM:  VITAL SIGNS: ED Triage Vitals  Enc Vitals Group      BP 02/27/17 2044 (!) 151/68     Pulse Rate 02/27/17 2044 70     Resp 02/27/17 2044 20     Temp 02/27/17 2044 99 F (37.2 C)     Temp Source 02/27/17 2044 Oral     SpO2 02/27/17 2044 99 %     Weight 02/27/17 2042 (!) 313 lb (142 kg)     Height 02/27/17 2042 5\' 8"  (1.727 m)     Head Circumference --      Peak Flow --      Pain Score 02/27/17 2042 7     Pain Loc --      Pain Edu? --      Excl. in [place]? --     Vital signs reviewed, nursing assessments reviewed.   Constitutional:   Alert and oriented. Well appearing and in no distress. Eyes:   No scleral icterus.  EOMI. No nystagmus. No conjunctival pallor. PERRL. ENT   Head:   Normocephalic and atraumatic.   Nose:   No congestion/rhinnorhea.    Mouth/Throat:   MMM, no pharyngeal erythema. No peritonsillar mass.    Neck:   No meningismus. Full ROM Hematological/Lymphatic/Immunilogical:   No cervical lymphadenopathy. Cardiovascular:   RRR. Symmetric bilateral radial and DP pulses.  No murmurs.  Respiratory:   Normal respiratory effort without tachypnea/retractions. Breath sounds are clear and equal bilaterally. No wheezes/rales/rhonchi. Gastrointestinal:   Truncal obesity. Soft with mild LUQ tenderness. Non distended. There is no CVA tenderness.  No rebound, rigidity, or guarding. Genitourinary:   deferred Musculoskeletal:   Normal range of motion in all extremities. No joint effusions.  No lower extremity tenderness.  No edema. Neurologic:   Normal speech and language.  Motor grossly intact. No gross focal neurologic deficits are appreciated.  Skin:    Skin is warm, dry and intact. No rash noted.  No petechiae, purpura, or bullae.  ____________________________________________    LABS (pertinent positives/negatives) (all labs ordered are listed, but only abnormal results are displayed) Labs Reviewed  COMPREHENSIVE METABOLIC PANEL - Abnormal; Notable for the following:       Result Value   Glucose, Bld 233 (*)     BUN 21 (*)    Creatinine, Ser 1.38 (*)    GFR calc non Af Amer 53 (*)    All other components within normal limits  CBC - Abnormal; Notable for the following:    WBC 11.4 (*)    All other components within normal limits  URINALYSIS, COMPLETE (UACMP) WITH MICROSCOPIC - Abnormal; Notable for the following:    Color, Urine YELLOW (*)    APPearance CLEAR (*)    Glucose, UA 150 (*)    Squamous Epithelial / LPF 0-5 (*)    All other components within normal limits  LIPASE, BLOOD   ____________________________________________   EKG    ____________________________________________    RADIOLOGY  Dg Abdomen  Acute W/chest  Result Date: 02/28/2017 CLINICAL DATA:  Acute onset of left upper quadrant abdominal pain and nausea. Initial encounter. EXAM: DG ABDOMEN ACUTE W/ 1V CHEST COMPARISON:  CT of the abdomen and pelvis performed 08/24/2010 FINDINGS: The lungs are well-aerated. Vascular congestion is noted. Increased interstitial markings may reflect mild interstitial edema. There is no evidence of pleural effusion or pneumothorax. The cardiomediastinal silhouette is mildly enlarged. The visualized bowel gas pattern is unremarkable. Scattered stool and air are seen within the colon; there is no evidence of small bowel dilatation to suggest obstruction. No free intra-abdominal air is identified on the provided upright view. Clips are noted within the right upper quadrant, reflecting prior cholecystectomy. No acute osseous abnormalities are seen; the sacroiliac joints are unremarkable in appearance. IMPRESSION: 1. Unremarkable bowel gas pattern; no free intra-abdominal air seen. Small to moderate amount of stool noted in the colon. 2. Vascular congestion and mild cardiomegaly. Increased interstitial markings may reflect mild interstitial edema. Electronically Signed   By: Garald Balding M.D.   On: 02/28/2017 00:56     ____________________________________________   PROCEDURES Procedures  ____________________________________________   INITIAL IMPRESSION / ASSESSMENT AND PLAN / ED COURSE  Pertinent labs & imaging results that were available during my care of the patient were reviewed by me and considered in my medical decision making (see chart for details).  Patient well appearing no acute distress, presents with left upper quadrant abdominal pain. Symptoms and exam consistent with gastritis.Considering the patient's symptoms, medical history, and physical examination today, I have low suspicion for cholecystitis or biliary pathology, pancreatitis, perforation or bowel obstruction, hernia, intra-abdominal abscess, AAA or dissection, volvulus or intussusception, mesenteric ischemia, or appendicitis.  Labs unremarkable, x-ray abdomen series unremarkable, negative for obstruction. Patient given antiacids and feels much better with near resolution of his symptoms. Follow up with primary care in good condition.      ____________________________________________   FINAL CLINICAL IMPRESSION(S) / ED DIAGNOSES  Final diagnoses:  Left upper quadrant pain  Acute gastritis without hemorrhage, unspecified gastritis type      New Prescriptions   FAMOTIDINE (PEPCID) 20 MG TABLET    Take 1 tablet (20 mg total) by mouth 2 (two) times daily.   METOCLOPRAMIDE (REGLAN) 10 MG TABLET    Take 1 tablet (10 mg total) by mouth every 6 (six) hours as needed.   SUCRALFATE (CARAFATE) 1 G TABLET    Take 1 tablet (1 g total) by mouth 4 (four) times daily.     Portions of this note were generated with dragon dictation software. Dictation errors may occur despite best attempts at proofreading.    Carrie Mew, MD 02/28/17 [phone]

## 2017-02-28 NOTE — ED Notes (Signed)
Patient transported to X-ray 

## 2017-03-25 DIAGNOSIS — I10 Essential (primary) hypertension: Secondary | ICD-10-CM | POA: Diagnosis not present

## 2017-04-16 DIAGNOSIS — H61001 Unspecified perichondritis of right external ear: Secondary | ICD-10-CM | POA: Diagnosis not present

## 2017-04-16 DIAGNOSIS — L578 Other skin changes due to chronic exposure to nonionizing radiation: Secondary | ICD-10-CM | POA: Diagnosis not present

## 2017-04-16 DIAGNOSIS — L570 Actinic keratosis: Secondary | ICD-10-CM | POA: Diagnosis not present

## 2017-04-16 DIAGNOSIS — D692 Other nonthrombocytopenic purpura: Secondary | ICD-10-CM | POA: Diagnosis not present

## 2017-04-18 DIAGNOSIS — I10 Essential (primary) hypertension: Secondary | ICD-10-CM | POA: Diagnosis not present

## 2017-04-18 DIAGNOSIS — Z125 Encounter for screening for malignant neoplasm of prostate: Secondary | ICD-10-CM | POA: Diagnosis not present

## 2017-04-18 DIAGNOSIS — E119 Type 2 diabetes mellitus without complications: Secondary | ICD-10-CM | POA: Diagnosis not present

## 2017-04-18 DIAGNOSIS — Z79899 Other long term (current) drug therapy: Secondary | ICD-10-CM | POA: Diagnosis not present

## 2017-04-19 DIAGNOSIS — H903 Sensorineural hearing loss, bilateral: Secondary | ICD-10-CM | POA: Diagnosis not present

## 2017-04-19 DIAGNOSIS — R42 Dizziness and giddiness: Secondary | ICD-10-CM | POA: Diagnosis not present

## 2017-04-25 DIAGNOSIS — I10 Essential (primary) hypertension: Secondary | ICD-10-CM | POA: Diagnosis not present

## 2017-04-25 DIAGNOSIS — E119 Type 2 diabetes mellitus without complications: Secondary | ICD-10-CM | POA: Diagnosis not present

## 2017-04-25 DIAGNOSIS — E7800 Pure hypercholesterolemia, unspecified: Secondary | ICD-10-CM | POA: Diagnosis not present

## 2017-04-25 DIAGNOSIS — Z0000 Encounter for general adult medical examination without abnormal findings: Secondary | ICD-10-CM | POA: Diagnosis not present

## 2017-05-09 DIAGNOSIS — H903 Sensorineural hearing loss, bilateral: Secondary | ICD-10-CM | POA: Diagnosis not present

## 2017-05-23 DIAGNOSIS — Z23 Encounter for immunization: Secondary | ICD-10-CM | POA: Diagnosis not present

## 2017-06-12 DIAGNOSIS — G4733 Obstructive sleep apnea (adult) (pediatric): Secondary | ICD-10-CM | POA: Diagnosis not present

## 2017-06-12 DIAGNOSIS — E119 Type 2 diabetes mellitus without complications: Secondary | ICD-10-CM | POA: Diagnosis not present

## 2017-06-12 DIAGNOSIS — R42 Dizziness and giddiness: Secondary | ICD-10-CM | POA: Diagnosis not present

## 2017-06-12 DIAGNOSIS — I10 Essential (primary) hypertension: Secondary | ICD-10-CM | POA: Diagnosis not present

## 2017-06-26 DIAGNOSIS — R0609 Other forms of dyspnea: Secondary | ICD-10-CM | POA: Diagnosis not present

## 2017-06-26 DIAGNOSIS — R5383 Other fatigue: Secondary | ICD-10-CM | POA: Diagnosis not present

## 2017-06-26 DIAGNOSIS — D649 Anemia, unspecified: Secondary | ICD-10-CM | POA: Diagnosis not present

## 2017-06-26 DIAGNOSIS — R5381 Other malaise: Secondary | ICD-10-CM | POA: Diagnosis not present

## 2017-06-26 DIAGNOSIS — Z79899 Other long term (current) drug therapy: Secondary | ICD-10-CM | POA: Diagnosis not present

## 2017-07-03 DIAGNOSIS — Z860101 Personal history of adenomatous and serrated colon polyps: Secondary | ICD-10-CM | POA: Insufficient documentation

## 2017-07-03 DIAGNOSIS — Z86010 Personal history of colonic polyps: Secondary | ICD-10-CM | POA: Diagnosis not present

## 2017-07-06 DIAGNOSIS — D649 Anemia, unspecified: Secondary | ICD-10-CM | POA: Diagnosis not present

## 2017-08-23 DIAGNOSIS — J208 Acute bronchitis due to other specified organisms: Secondary | ICD-10-CM | POA: Diagnosis not present

## 2017-09-25 ENCOUNTER — Encounter: Payer: Self-pay | Admitting: Student

## 2017-09-26 ENCOUNTER — Ambulatory Visit
Admission: RE | Admit: 2017-09-26 | Discharge: 2017-09-26 | Disposition: A | Discharge status: Home / Self Care | Payer: 59 | Source: Ambulatory Visit | Attending: Unknown Physician Specialty | Admitting: Unknown Physician Specialty

## 2017-09-26 ENCOUNTER — Ambulatory Visit: Payer: 59 | Admitting: Anesthesiology

## 2017-09-26 ENCOUNTER — Encounter: Payer: Self-pay | Admitting: Anesthesiology

## 2017-09-26 ENCOUNTER — Encounter
Admission: RE | Disposition: A | Discharge status: Home / Self Care | Payer: Self-pay | Source: Ambulatory Visit | Attending: Unknown Physician Specialty

## 2017-09-26 DIAGNOSIS — Z7982 Long term (current) use of aspirin: Secondary | ICD-10-CM | POA: Diagnosis not present

## 2017-09-26 DIAGNOSIS — E785 Hyperlipidemia, unspecified: Secondary | ICD-10-CM | POA: Diagnosis not present

## 2017-09-26 DIAGNOSIS — K635 Polyp of colon: Secondary | ICD-10-CM | POA: Diagnosis not present

## 2017-09-26 DIAGNOSIS — Z86010 Personal history of colonic polyps: Secondary | ICD-10-CM | POA: Insufficient documentation

## 2017-09-26 DIAGNOSIS — K219 Gastro-esophageal reflux disease without esophagitis: Secondary | ICD-10-CM | POA: Insufficient documentation

## 2017-09-26 DIAGNOSIS — M1990 Unspecified osteoarthritis, unspecified site: Secondary | ICD-10-CM | POA: Diagnosis not present

## 2017-09-26 DIAGNOSIS — G4730 Sleep apnea, unspecified: Secondary | ICD-10-CM | POA: Insufficient documentation

## 2017-09-26 DIAGNOSIS — Z1211 Encounter for screening for malignant neoplasm of colon: Secondary | ICD-10-CM | POA: Insufficient documentation

## 2017-09-26 DIAGNOSIS — Z79899 Other long term (current) drug therapy: Secondary | ICD-10-CM | POA: Diagnosis not present

## 2017-09-26 DIAGNOSIS — Z7984 Long term (current) use of oral hypoglycemic drugs: Secondary | ICD-10-CM | POA: Insufficient documentation

## 2017-09-26 DIAGNOSIS — D122 Benign neoplasm of ascending colon: Secondary | ICD-10-CM | POA: Diagnosis not present

## 2017-09-26 DIAGNOSIS — I10 Essential (primary) hypertension: Secondary | ICD-10-CM | POA: Diagnosis not present

## 2017-09-26 DIAGNOSIS — K640 First degree hemorrhoids: Secondary | ICD-10-CM | POA: Diagnosis not present

## 2017-09-26 DIAGNOSIS — E119 Type 2 diabetes mellitus without complications: Secondary | ICD-10-CM | POA: Insufficient documentation

## 2017-09-26 DIAGNOSIS — Z87891 Personal history of nicotine dependence: Secondary | ICD-10-CM | POA: Insufficient documentation

## 2017-09-26 DIAGNOSIS — K648 Other hemorrhoids: Secondary | ICD-10-CM | POA: Diagnosis not present

## 2017-09-26 HISTORY — DX: Malignant (primary) neoplasm, unspecified: C80.1

## 2017-09-26 HISTORY — PX: COLONOSCOPY WITH PROPOFOL: SHX5780

## 2017-09-26 HISTORY — DX: Gastro-esophageal reflux disease without esophagitis: K21.9

## 2017-09-26 HISTORY — DX: Gastritis, unspecified, without bleeding: K29.70

## 2017-09-26 HISTORY — DX: Hyperlipidemia, unspecified: E78.5

## 2017-09-26 HISTORY — DX: Unspecified osteoarthritis, unspecified site: M19.90

## 2017-09-26 HISTORY — DX: Sleep apnea, unspecified: G47.30

## 2017-09-26 LAB — GLUCOSE, CAPILLARY: GLUCOSE-CAPILLARY: 151 mg/dL — AB (ref 65–99)

## 2017-09-26 SURGERY — COLONOSCOPY WITH PROPOFOL
Anesthesia: General

## 2017-09-26 MED ORDER — SODIUM CHLORIDE 0.9 % IV SOLN
INTRAVENOUS | Status: DC
  Administered 2017-09-26: 1000 mL via INTRAVENOUS

## 2017-09-26 MED ORDER — PROPOFOL 10 MG/ML IV BOLUS
INTRAVENOUS | Status: DC | PRN
  Administered 2017-09-26: 100 mg via INTRAVENOUS

## 2017-09-26 MED ORDER — SODIUM CHLORIDE 0.9 % IV SOLN: INTRAVENOUS | Status: DC

## 2017-09-26 MED ORDER — LIDOCAINE HCL (PF) 2 % IJ SOLN
INTRAMUSCULAR | Status: AC
  Filled 2017-09-26: qty 10

## 2017-09-26 MED ORDER — PHENYLEPHRINE HCL 10 MG/ML IJ SOLN
INTRAMUSCULAR | Status: DC | PRN
  Administered 2017-09-26 (×2): 100 ug via INTRAVENOUS

## 2017-09-26 MED ORDER — PROPOFOL 10 MG/ML IV BOLUS
INTRAVENOUS | Status: AC
  Filled 2017-09-26: qty 20

## 2017-09-26 MED ORDER — LIDOCAINE 2% (20 MG/ML) 5 ML SYRINGE
INTRAMUSCULAR | Status: DC | PRN
  Administered 2017-09-26: 40 mg via INTRAVENOUS

## 2017-09-26 MED ORDER — PROPOFOL 500 MG/50ML IV EMUL
INTRAVENOUS | Status: DC | PRN
  Administered 2017-09-26: 160 ug/kg/min via INTRAVENOUS

## 2017-09-26 MED ORDER — FENTANYL CITRATE (PF) 100 MCG/2ML IJ SOLN
INTRAMUSCULAR | Status: DC | PRN
  Administered 2017-09-26 (×2): 50 ug via INTRAVENOUS

## 2017-09-26 MED ORDER — FENTANYL CITRATE (PF) 100 MCG/2ML IJ SOLN
INTRAMUSCULAR | Status: AC
  Filled 2017-09-26: qty 2

## 2017-09-26 MED ORDER — PROPOFOL 500 MG/50ML IV EMUL
INTRAVENOUS | Status: AC
  Filled 2017-09-26: qty 50

## 2017-09-26 NOTE — H&P (Signed)
Primary Care Physician:  Idelle Crouch, MD Primary Gastroenterologist:  Dr. Vira Agar  Pre-Procedure History & Physical: HPI:  Danny Maldonado is a 62 y.o. male is here for an colonoscopy.   Past Medical History:  Diagnosis Date  . Allergy   . Cancer ([place])   . Diabetes mellitus without complication ([place])   . DJD (degenerative joint disease)   . Gastritis   . GERD (gastroesophageal reflux disease)   . Hyperlipidemia   . Sleep apnea     Past Surgical History:  Procedure Laterality Date  . CARDIAC CATHETERIZATION    . CHOLECYSTECTOMY    . HERNIA REPAIR    . right trigger thumb release    . VASECTOMY      Prior to Admission medications   Medication Sig Start Date End Date Taking? Authorizing Provider  acetaminophen (TYLENOL) 500 MG tablet Take 1,000 mg by mouth every 6 (six) hours as needed (PRN - takes approximately once per week).   Yes [provider]  aspirin EC 81 MG tablet Take 81 mg by mouth daily.   Yes [provider]  cetirizine (ZYRTEC) 10 MG tablet Take 10 mg by mouth every evening.    Yes [provider]  Cyanocobalamin 500 MCG SUBL Place 1,000 mcg under the tongue daily.   Yes [provider]  diclofenac sodium (VOLTAREN) 1 % GEL Apply topically 4 (four) times daily.   Yes [provider]  famotidine (PEPCID) 20 MG tablet Take 1 tablet (20 mg total) by mouth 2 (two) times daily. 02/28/17  Yes Carrie Mew, MD  fluticasone [facility]) 50 MCG/ACT nasal spray Place 2 sprays into both nostrils daily.   Yes [provider]  glimepiride (AMARYL) 2 MG tablet Take 2 mg by mouth daily with breakfast.   Yes [provider]  HYDROcodone-acetaminophen (NORCO) 7.5-325 MG tablet Take 1 tablet by mouth every 6 (six) hours as needed for moderate pain.   Yes [provider]  lisinopril (PRINIVIL,ZESTRIL) 10 MG tablet Take 10 mg by mouth daily.   Yes [provider]  loratadine (CLARITIN) 10 MG  tablet Take 10 mg by mouth every morning.    Yes [provider]  magnesium oxide (MAG-OX) 400 MG tablet Take 400 mg by mouth daily.   Yes [provider]  metFORMIN (GLUCOPHAGE) 1000 MG tablet Take 1,000 mg by mouth 2 (two) times daily with a meal.   Yes [provider]  Multiple Vitamin (MULTIVITAMIN) tablet Take 1 tablet by mouth daily.   Yes [provider]  omeprazole (PRILOSEC) 20 MG capsule Take 20 mg by mouth daily.   Yes [provider]  sucralfate (CARAFATE) 1 g tablet Take 1 tablet (1 g total) by mouth 4 (four) times daily. 02/28/17  Yes Carrie Mew, MD  meloxicam (MOBIC) 15 MG tablet Take 15 mg by mouth daily.    [provider]  metoCLOPramide (REGLAN) 10 MG tablet Take 1 tablet (10 mg total) by mouth every 6 (six) hours as needed. Patient not taking: Reported on 09/26/2017 02/28/17   Carrie Mew, MD  pantoprazole (PROTONIX) 40 MG tablet Take 40 mg by mouth daily.    [provider]    Allergies as of 07/23/2017  . (No Known Allergies)    Family History  Problem Relation Age of Onset  . Hypertension Mother   . COPD Father   . Hypertension Father   . Stroke Maternal Grandfather     Social History   Socioeconomic  History  . Marital status: Married    Spouse name: Not on file  . Number of children: Not on file  . Years of education: Not on file  . Highest education level: Not on file  Social Needs  . Financial resource strain: Not on file  . Food insecurity - worry: Not on file  . Food insecurity - inability: Not on file  . Transportation needs - medical: Not on file  . Transportation needs - non-medical: Not on file  Occupational History  . Not on file  Tobacco Use  . Smoking status: Former Smoker    Last attempt to quit: 12/02/1985    Years since quitting: 31.8  . Smokeless tobacco: Never Used  Substance and Sexual Activity  . Alcohol use: No    Alcohol/week: 0.0 oz  . Drug use: No  .  Sexual activity: Not on file  Other Topics Concern  . Not on file  Social History Narrative  . Not on file    Review of Systems: See HPI, otherwise negative ROS  Physical Exam: BP 122/66   Pulse 86   Temp (!) 96.6 F (35.9 C) (Tympanic)   Resp 20   Ht 5' 8.5" (1.74 m)   Wt (!) 140.6 kg (310 lb)   SpO2 99%   BMI 46.45 kg/m  General:   Alert,  pleasant and cooperative in NAD Head:  Normocephalic and atraumatic. Neck:  Supple; no masses or thyromegaly. Lungs:  Clear throughout to auscultation.    Heart:  Regular rate and rhythm. Abdomen:  Soft, nontender and nondistended. Normal bowel sounds, without guarding, and without rebound.   Neurologic:  Alert and  oriented x4;  grossly normal neurologically.  Impression/Plan: Danny Maldonado is here for an colonoscopy to be performed for [facility] colon polyps.  Risks, benefits, limitations, and alternatives regarding  colonoscopy have been reviewed with the patient.  Questions have been answered.  All parties agreeable.   Gaylyn Cheers, MD  09/26/2017, 8:39 AM

## 2017-09-26 NOTE — Anesthesia Post-op Follow-up Note (Signed)
Anesthesia QCDR form completed.        

## 2017-09-26 NOTE — Transfer of Care (Signed)
Immediate Anesthesia Transfer of Care Note  Patient: Danny Maldonado  Procedure(s) Performed: COLONOSCOPY WITH PROPOFOL (N/A )  Patient Location: PACU and Endoscopy Unit  Anesthesia Type:General  Level of Consciousness: awake and patient cooperative  Airway & Oxygen Therapy: Patient Spontanous Breathing and Patient connected to nasal cannula oxygen  Post-op Assessment: Report given to RN and Post -op Vital signs reviewed and stable  Post vital signs: Reviewed and stable  Last Vitals:  Vitals:   09/26/17 0801  BP: 122/66  Pulse: 86  Resp: 20  Temp: (!) 35.9 C  SpO2: 99%    Last Pain:  Vitals:   09/26/17 0801  TempSrc: Tympanic  PainSc: 2          Complications: No apparent anesthesia complications

## 2017-09-26 NOTE — Anesthesia Preprocedure Evaluation (Signed)
Anesthesia Evaluation  Patient identified by MRN, date of birth, ID band Patient awake    Reviewed: Allergy & Precautions, H&P , NPO status , Patient's Chart, lab work & pertinent test results  Airway Mallampati: III  TM Distance: <3 FB Neck ROM: limited    Dental  (+) Chipped   Pulmonary neg shortness of breath, sleep apnea and Continuous Positive Airway Pressure Ventilation , former smoker,           Cardiovascular Exercise Tolerance: Good hypertension, (-) angina(-) Past MI and (-) DOE      Neuro/Psych negative neurological ROS  negative psych ROS   GI/Hepatic Neg liver ROS, GERD  Medicated and Controlled,  Endo/Other  diabetes, Type 2  Renal/GU negative Renal ROS  negative genitourinary   Musculoskeletal  (+) Arthritis ,   Abdominal   Peds  Hematology negative hematology ROS (+)   Anesthesia Other Findings Past Medical History: No date: Allergy No date: Cancer ([place]) No date: Diabetes mellitus without complication ([facility]) No date: DJD (degenerative joint disease) No date: Gastritis No date: GERD (gastroesophageal reflux disease) No date: Hyperlipidemia No date: Sleep apnea  Past Surgical History: No date: CARDIAC CATHETERIZATION No date: CHOLECYSTECTOMY No date: HERNIA REPAIR No date: right trigger thumb release No date: VASECTOMY  BMI    Body Mass Index:  46.45 kg/m      Reproductive/Obstetrics negative OB ROS                             Anesthesia Physical Anesthesia Plan  ASA: III  Anesthesia Plan: General   Post-op Pain Management:    Induction: Intravenous  PONV Risk Score and Plan: Propofol infusion  Airway Management Planned: Natural Airway and Nasal Cannula  Additional Equipment:   Intra-op Plan:   Post-operative Plan:   Informed Consent: I have reviewed the patients History and Physical, chart, labs and discussed the procedure including the risks,  benefits and alternatives for the proposed anesthesia with the patient or authorized representative who has indicated his/her understanding and acceptance.   Dental Advisory Given  Plan Discussed with: Anesthesiologist, CRNA and Surgeon  Anesthesia Plan Comments: (Patient consented for risks of anesthesia including but not limited to:  - adverse reactions to medications - risk of intubation if required - damage to teeth, lips or other oral mucosa - sore throat or hoarseness - Damage to heart, brain, lungs or loss of life  Patient voiced understanding.)        Anesthesia Quick Evaluation

## 2017-09-26 NOTE — Op Note (Signed)
[facility] Gastroenterology Patient Name: Danny Maldonado Procedure Date: 09/26/2017 8:40 AM MRN: [id number] Account #: [id number] Date of Birth: 04-Feb-1956 Admit Type: Outpatient Age: 62 Room: [facility] ENDO ROOM 3 Gender: Male Note Status: Finalized Procedure:            Colonoscopy Indications:          High risk colon cancer surveillance: Personal history                        of colonic polyps Providers:            Manya Silvas, MD Referring MD:         Leonie Douglas. Doy Hutching, MD (Referring MD) Medicines:            Propofol per Anesthesia Complications:        No immediate complications. Procedure:            Pre-Anesthesia Assessment:                       - After reviewing the risks and benefits, the patient                        was deemed in satisfactory condition to undergo the                        procedure.                       After obtaining informed consent, the colonoscope was                        passed under direct vision. Throughout the procedure,                        the patient's blood pressure, pulse, and oxygen                        saturations were monitored continuously. The                        Colonoscope was introduced through the anus and                        advanced to the the cecum, identified by appendiceal                        orifice and ileocecal valve. The colonoscopy was                        performed without difficulty. The patient tolerated the                        procedure well. The quality of the bowel preparation                        was good. Findings:      A diminutive polyp was found in the proximal ascending colon. The polyp       was sessile. The polyp was removed with a jumbo cold forceps. Resection       and retrieval were complete.      Some stool was  seen in cecum and this was moved around to get a complete       view.Internal hemorrhoids were found during endoscopy. The hemorrhoids   were small and Grade I (internal hemorrhoids that do not prolapse).      The exam was otherwise without abnormality. Impression:           - One diminutive polyp in the proximal ascending colon,                        removed with a jumbo cold forceps. Resected and                        retrieved.                       - Internal hemorrhoids.                       - The examination was otherwise normal. Recommendation:       - Await pathology results. Manya Silvas, MD 09/26/2017 9:04:49 AM This report has been signed electronically. Number of Addenda: 0 Note Initiated On: 09/26/2017 8:40 AM Scope Withdrawal Time: 0 hours 10 minutes 14 seconds  Total Procedure Duration: 0 hours 16 minutes 7 seconds       [facility]

## 2017-09-26 NOTE — Anesthesia Postprocedure Evaluation (Signed)
Anesthesia Post Note  Patient: Danny Maldonado  Procedure(s) Performed: COLONOSCOPY WITH PROPOFOL (N/A )  Patient location during evaluation: Endoscopy Anesthesia Type: General Level of consciousness: awake and alert Pain management: pain level controlled Vital Signs Assessment: post-procedure vital signs reviewed and stable Respiratory status: spontaneous breathing, nonlabored ventilation, respiratory function stable and patient connected to nasal cannula oxygen Cardiovascular status: blood pressure returned to baseline and stable Postop Assessment: no apparent nausea or vomiting Anesthetic complications: no     Last Vitals:  Vitals:   09/26/17 0928 09/26/17 0938  BP: 117/69 111/74  Pulse: 76 71  Resp: 15 20  Temp:    SpO2: 98% 98%    Last Pain:  Vitals:   09/26/17 0938  TempSrc:   PainSc: 0-No pain                 Precious Haws Piscitello

## 2017-09-27 ENCOUNTER — Encounter: Payer: Self-pay | Admitting: Unknown Physician Specialty

## 2017-09-28 LAB — SURGICAL PATHOLOGY

## 2017-10-23 DIAGNOSIS — Z125 Encounter for screening for malignant neoplasm of prostate: Secondary | ICD-10-CM | POA: Diagnosis not present

## 2017-10-23 DIAGNOSIS — E119 Type 2 diabetes mellitus without complications: Secondary | ICD-10-CM | POA: Diagnosis not present

## 2017-10-23 DIAGNOSIS — Z79899 Other long term (current) drug therapy: Secondary | ICD-10-CM | POA: Diagnosis not present

## 2017-10-23 DIAGNOSIS — E7800 Pure hypercholesterolemia, unspecified: Secondary | ICD-10-CM | POA: Diagnosis not present

## 2017-10-23 DIAGNOSIS — I10 Essential (primary) hypertension: Secondary | ICD-10-CM | POA: Diagnosis not present

## 2017-10-30 DIAGNOSIS — E119 Type 2 diabetes mellitus without complications: Secondary | ICD-10-CM | POA: Diagnosis not present

## 2017-10-30 DIAGNOSIS — M25521 Pain in right elbow: Secondary | ICD-10-CM | POA: Diagnosis not present

## 2017-10-30 DIAGNOSIS — I10 Essential (primary) hypertension: Secondary | ICD-10-CM | POA: Diagnosis not present

## 2017-10-30 DIAGNOSIS — E7800 Pure hypercholesterolemia, unspecified: Secondary | ICD-10-CM | POA: Diagnosis not present

## 2017-10-30 DIAGNOSIS — G4733 Obstructive sleep apnea (adult) (pediatric): Secondary | ICD-10-CM | POA: Diagnosis not present

## 2017-11-01 DIAGNOSIS — L812 Freckles: Secondary | ICD-10-CM | POA: Diagnosis not present

## 2017-11-01 DIAGNOSIS — H61001 Unspecified perichondritis of right external ear: Secondary | ICD-10-CM | POA: Diagnosis not present

## 2017-11-01 DIAGNOSIS — D2230 Melanocytic nevi of unspecified part of face: Secondary | ICD-10-CM | POA: Diagnosis not present

## 2017-11-01 DIAGNOSIS — L570 Actinic keratosis: Secondary | ICD-10-CM | POA: Diagnosis not present

## 2017-11-01 DIAGNOSIS — D485 Neoplasm of uncertain behavior of skin: Secondary | ICD-10-CM | POA: Diagnosis not present

## 2017-11-01 DIAGNOSIS — L821 Other seborrheic keratosis: Secondary | ICD-10-CM | POA: Diagnosis not present

## 2017-11-01 DIAGNOSIS — D229 Melanocytic nevi, unspecified: Secondary | ICD-10-CM | POA: Diagnosis not present

## 2017-11-01 DIAGNOSIS — Z1283 Encounter for screening for malignant neoplasm of skin: Secondary | ICD-10-CM | POA: Diagnosis not present

## 2017-11-01 DIAGNOSIS — D225 Melanocytic nevi of trunk: Secondary | ICD-10-CM | POA: Diagnosis not present

## 2017-11-01 DIAGNOSIS — L578 Other skin changes due to chronic exposure to nonionizing radiation: Secondary | ICD-10-CM | POA: Diagnosis not present

## 2017-11-01 DIAGNOSIS — Z85828 Personal history of other malignant neoplasm of skin: Secondary | ICD-10-CM | POA: Diagnosis not present

## 2017-11-26 DIAGNOSIS — H524 Presbyopia: Secondary | ICD-10-CM | POA: Diagnosis not present

## 2017-11-26 DIAGNOSIS — H5203 Hypermetropia, bilateral: Secondary | ICD-10-CM | POA: Diagnosis not present

## 2017-11-26 DIAGNOSIS — H2513 Age-related nuclear cataract, bilateral: Secondary | ICD-10-CM | POA: Diagnosis not present

## 2017-11-26 DIAGNOSIS — I10 Essential (primary) hypertension: Secondary | ICD-10-CM | POA: Diagnosis not present

## 2017-11-26 DIAGNOSIS — H52223 Regular astigmatism, bilateral: Secondary | ICD-10-CM | POA: Diagnosis not present

## 2017-11-26 DIAGNOSIS — E119 Type 2 diabetes mellitus without complications: Secondary | ICD-10-CM | POA: Diagnosis not present

## 2017-12-17 DIAGNOSIS — E7800 Pure hypercholesterolemia, unspecified: Secondary | ICD-10-CM | POA: Diagnosis not present

## 2017-12-17 DIAGNOSIS — G4733 Obstructive sleep apnea (adult) (pediatric): Secondary | ICD-10-CM | POA: Diagnosis not present

## 2017-12-17 DIAGNOSIS — I10 Essential (primary) hypertension: Secondary | ICD-10-CM | POA: Diagnosis not present

## 2017-12-31 DIAGNOSIS — L578 Other skin changes due to chronic exposure to nonionizing radiation: Secondary | ICD-10-CM | POA: Diagnosis not present

## 2017-12-31 DIAGNOSIS — L570 Actinic keratosis: Secondary | ICD-10-CM | POA: Diagnosis not present

## 2017-12-31 DIAGNOSIS — H61001 Unspecified perichondritis of right external ear: Secondary | ICD-10-CM | POA: Diagnosis not present

## 2018-02-26 DIAGNOSIS — Z79899 Other long term (current) drug therapy: Secondary | ICD-10-CM | POA: Diagnosis not present

## 2018-02-26 DIAGNOSIS — E119 Type 2 diabetes mellitus without complications: Secondary | ICD-10-CM | POA: Diagnosis not present

## 2018-02-26 DIAGNOSIS — E7800 Pure hypercholesterolemia, unspecified: Secondary | ICD-10-CM | POA: Diagnosis not present

## 2018-03-05 DIAGNOSIS — E119 Type 2 diabetes mellitus without complications: Secondary | ICD-10-CM | POA: Diagnosis not present

## 2018-03-05 DIAGNOSIS — G4733 Obstructive sleep apnea (adult) (pediatric): Secondary | ICD-10-CM | POA: Diagnosis not present

## 2018-03-05 DIAGNOSIS — I10 Essential (primary) hypertension: Secondary | ICD-10-CM | POA: Diagnosis not present

## 2018-03-05 DIAGNOSIS — M545 Low back pain: Secondary | ICD-10-CM | POA: Diagnosis not present

## 2018-03-11 ENCOUNTER — Other Ambulatory Visit: Payer: Self-pay | Admitting: Internal Medicine

## 2018-03-11 DIAGNOSIS — M5442 Lumbago with sciatica, left side: Principal | ICD-10-CM

## 2018-03-11 DIAGNOSIS — M5441 Lumbago with sciatica, right side: Secondary | ICD-10-CM

## 2018-03-22 ENCOUNTER — Ambulatory Visit
Admission: RE | Admit: 2018-03-22 | Discharge: 2018-03-22 | Disposition: A | Discharge status: Home / Self Care | Payer: 59 | Source: Ambulatory Visit | Attending: Internal Medicine | Admitting: Internal Medicine

## 2018-03-22 DIAGNOSIS — M545 Low back pain: Secondary | ICD-10-CM | POA: Diagnosis not present

## 2018-03-22 DIAGNOSIS — M5441 Lumbago with sciatica, right side: Secondary | ICD-10-CM | POA: Diagnosis not present

## 2018-03-22 DIAGNOSIS — E882 Lipomatosis, not elsewhere classified: Secondary | ICD-10-CM | POA: Insufficient documentation

## 2018-03-22 DIAGNOSIS — M47816 Spondylosis without myelopathy or radiculopathy, lumbar region: Secondary | ICD-10-CM | POA: Diagnosis not present

## 2018-03-22 DIAGNOSIS — M5442 Lumbago with sciatica, left side: Secondary | ICD-10-CM | POA: Insufficient documentation

## 2018-04-03 DIAGNOSIS — Z872 Personal history of diseases of the skin and subcutaneous tissue: Secondary | ICD-10-CM | POA: Diagnosis not present

## 2018-04-03 DIAGNOSIS — L570 Actinic keratosis: Secondary | ICD-10-CM | POA: Diagnosis not present

## 2018-04-04 DIAGNOSIS — M545 Low back pain: Secondary | ICD-10-CM | POA: Diagnosis not present

## 2018-04-04 DIAGNOSIS — G8929 Other chronic pain: Secondary | ICD-10-CM | POA: Diagnosis not present

## 2018-04-10 DIAGNOSIS — M5136 Other intervertebral disc degeneration, lumbar region: Secondary | ICD-10-CM | POA: Diagnosis not present

## 2018-04-10 DIAGNOSIS — M48061 Spinal stenosis, lumbar region without neurogenic claudication: Secondary | ICD-10-CM | POA: Diagnosis not present

## 2018-04-25 ENCOUNTER — Ambulatory Visit: Payer: 59 | Attending: Neurosurgery

## 2018-04-25 DIAGNOSIS — M6281 Muscle weakness (generalized): Secondary | ICD-10-CM | POA: Insufficient documentation

## 2018-04-25 DIAGNOSIS — G8929 Other chronic pain: Secondary | ICD-10-CM | POA: Insufficient documentation

## 2018-04-25 DIAGNOSIS — M545 Low back pain, unspecified: Secondary | ICD-10-CM

## 2018-04-25 NOTE — Therapy (Signed)
[facility] [address] [place], [address] Phone: [phone]   Fax:  [phone]  Physical Therapy Evaluation  Patient Details  Name: Danny Maldonado MRN: [id number] Date of Birth: 1956-07-26 Referring Provider: Daun Peacock MD   Encounter Date: 04/25/2018  PT End of Session - 04/25/18 1244    Visit Number  1    Number of Visits  13    Date for PT Re-Evaluation  06/06/18    PT Start Time  1497    PT Stop Time  1145    PT Time Calculation (min)  60 min    Activity Tolerance  Patient tolerated treatment well    Behavior During Therapy  [facility] for tasks assessed/performed       Past Medical History:  Diagnosis Date  . Allergy   . Cancer ([place])   . Diabetes mellitus without complication ([place])   . DJD (degenerative joint disease)   . Gastritis   . GERD (gastroesophageal reflux disease)   . Hyperlipidemia   . Sleep apnea     Past Surgical History:  Procedure Laterality Date  . CARDIAC CATHETERIZATION    . CHOLECYSTECTOMY    . COLONOSCOPY WITH PROPOFOL N/A 09/26/2017   Procedure: COLONOSCOPY WITH PROPOFOL;  Surgeon: Manya Silvas, MD;  Location: [facility] ENDOSCOPY;  Service: Endoscopy;  Laterality: N/A;  . HERNIA REPAIR    . right trigger thumb release    . VASECTOMY      There were no vitals filed for this visit.   Subjective Assessment - 04/25/18 1059    Subjective  Patient reports increased low back pain which started "years ago" but has recently had an aggravation of symptoms in mid June of 2019. Patient reports inceased pain with trimming his bushes with a mini chainsaw on a pole. Patient reports the pain has decreased since taking muscle relaxers and rubbing voltaren on his back. Patient states increased pain with walking, standing for long periods of time, and lifting items. Patient reports decreased pain with rest and sitting. Patient states pain is worse with activity and often has increased  lagging pain at rest. Patient reports he would like to lose weight and develop a weight lifting program to decrease pain.      Pertinent History  DM II, sleep apnea, knee pain     Limitations  Lifting;Walking    How long can you stand comfortably?  73min    How long can you walk comfortably?  10 min     Patient Stated Goals  To decrease pain     Currently in Pain?  Yes    Pain Score  2    worst pain: 4/10; best: 0/10   Pain Location  Back    Pain Orientation  Lower;Right    Pain Descriptors / Indicators  Aching    Pain Type  Chronic pain    Pain Onset  More than a month ago    Pain Frequency  Intermittent         [facility] PT Assessment - 04/25/18 1151      Assessment   Medical Diagnosis  LBP    Referring Provider  Daun Peacock MD    Onset Date/Surgical Date  01/26/18    Hand Dominance  Right    Next MD Visit  unknown    Prior Therapy  no      Balance Screen   Has the patient fallen in the past 6 months  No  Has the patient had a decrease in activity level because of a fear of falling?   Yes    Is the patient reluctant to leave their home because of a fear of falling?   No      Home Film/video editor residence    Living Arrangements  Spouse/significant other      Prior Function   Level of Independence  Independent    Vocation  Unemployed    Vocation Requirements  N/A    Leisure  Watching TV, walking      Cognition   Overall Cognitive Status  Within Functional Limits for tasks assessed      Observation/Other Assessments   Observations  Decreased posterior pelvic tilt in sitting and standing      Sensation   Light Touch  Appears Intact      Functional Tests   Functional tests  Squat      Squat   Comments  Decreased AROM with motion      Posture/Postural Control   Posture Comments  Increased forward flexion       ROM / Strength   AROM / PROM / Strength  AROM;Strength      AROM   AROM Assessment Site  Lumbar;Hip;Knee    Right/Left  Hip  Right;Left    Right Hip Flexion  110   increased pain   Right Hip External Rotation   35    Right Hip Internal Rotation   20    Right Hip ABduction  40    Right Hip ADduction  10    Left Hip Flexion  110    Left Hip External Rotation   35    Left Hip Internal Rotation   20    Left Hip ABduction  40    Left Hip ADduction  10    Right/Left Knee  Right;Left    Right Knee Extension  0    Right Knee Flexion  110    Left Knee Extension  0    Left Knee Flexion  110    Lumbar Flexion  WNL   Increased pain at end range   Lumbar Extension  WNL    Lumbar - Right Side Bend  25% limited    Lumbar - Left Side Bend  25% limited - Increased pain    Lumbar - Right Rotation  25% limited - Increased pain    Lumbar - Left Rotation  WNL      Strength   Strength Assessment Site  Hip    Right/Left Hip  Right;Left    Right Hip Flexion  5/5    Right Hip Extension  4+/5    Right Hip External Rotation   4/5    Right Hip Internal Rotation  4-/5    Right Hip ABduction  4+/5    Right Hip ADduction  5/5    Left Hip Flexion  5/5    Left Hip Extension  5/5    Left Hip External Rotation  5/5    Left Hip Internal Rotation  4+/5    Left Hip ABduction  5/5    Left Hip ADduction  5/5      Palpation   Palpation comment  TTP: multifidus on the R side      Ambulation/Gait   Gait Comments  Increased hip circumduction B       Objective measurements completed on examination: See above findings.   TREATMENT: Therapeutic Exercise: Bridges in hooklying -- 2  x 10  Back extension with CKC -- 2 x 10 Seated pelvic tilts -- 2 x 10  Patient demonstrates increased fatigue at the end of the session      PT Education - 04/25/18 1242    Education Details  HEP: bridge, Back extension CKC, seated pelvic tilts    Person(s) Educated  Patient    Methods  Explanation;Demonstration;Handout    Comprehension  Verbalized understanding;Returned demonstration          PT Long Term Goals - 04/25/18 1246       PT LONG TERM GOAL #1   Title  Patient will be independent with HEP to continue benefits of therapy after discharge.    Baseline  Dependent with exercise form/technique     Time  6    Period  Weeks    Target Date  06/06/18      PT LONG TERM GOAL #2   Title  Patient will be able to ambulate for over 1 hour without increase in pain to better be able to perform recreational exercises without increase in pain    Baseline  59min    Time  6    Period  Weeks    Status  New    Target Date  06/06/18      PT LONG TERM GOAL #3   Title  Patient will be able able to stand without increase in pain for over 1 hour to better allow for performance of household activities such as cleaning and performing food preparation.     Baseline  92min    Time  6    Period  Weeks    Status  New    Target Date  06/06/18             Plan - 04/25/18 1301    Clinical Impression Statement  Patient is a 62 yo right hand dominant male presenting with increased pain and spasms along the R side of his lumbar spine. Patient demonstrates lumbar dysfunction with possible multifdus invovlement as indicating with TTP along the area, increased pain with activation of lumbar extensors, and improvement of pain with lumbar extension. Patient demonstrates decreased motor control and hip strength with lumbar and hip movements. Patient will benefit from further skilled therapy to return to prior level of function.       History and Personal Factors relevant to plan of care:  DMII, chronic pain    Clinical Presentation  Stable    Clinical Presentation due to:  pain improving overall    Clinical Decision Making  Moderate    Rehab Potential  Good    Clinical Impairments Affecting Rehab Potential  (+) highly motivated (-) obesity    PT Frequency  2x / week    PT Duration  6 weeks    PT Treatment/Interventions  Electrical Stimulation;Cryotherapy;Iontophoresis 4mg /ml Dexamethasone;Moist Heat;Ultrasound;Traction;Gait  training;Stair training;Therapeutic activities;Therapeutic exercise;Balance training;Patient/family education;Neuromuscular re-education;Manual techniques;Dry needling;Passive range of motion    PT Next Visit Plan  continue improving mobility    PT Home Exercise Plan  see education section    Consulted and Agree with Plan of Care  Patient       Patient will benefit from skilled therapeutic intervention in order to improve the following deficits and impairments:  Increased muscle spasms, Pain, Impaired sensation, Decreased coordination, Decreased mobility, Decreased endurance, Decreased range of motion, Decreased strength, Decreased activity tolerance, Difficulty walking  Visit Diagnosis: Chronic right-sided low back pain without sciatica  Muscle weakness (generalized)  Problem List Patient Active Problem List   Diagnosis Date Noted  . Diabetes mellitus type 2, uncomplicated ([place]) 03/79/4446  . DJD (degenerative joint disease) 06/27/2016  . Hyperlipidemia, unspecified 06/27/2016  . Hypertension 06/27/2016  . Sleep apnea 06/27/2016  . Syncope and collapse 05/10/2016    Blythe Stanford, PT DPT 04/25/2018, 7:30 PM  [facility] [address], [place], [address] Phone: [phone]   Fax:  [phone]  Name: Danny Maldonado MRN: [id number] Date of Birth: 02-06-1956

## 2018-05-01 ENCOUNTER — Ambulatory Visit: Payer: 59 | Attending: Neurosurgery

## 2018-05-01 DIAGNOSIS — M6281 Muscle weakness (generalized): Secondary | ICD-10-CM | POA: Insufficient documentation

## 2018-05-01 DIAGNOSIS — M545 Low back pain: Secondary | ICD-10-CM | POA: Insufficient documentation

## 2018-05-01 DIAGNOSIS — G8929 Other chronic pain: Secondary | ICD-10-CM | POA: Diagnosis not present

## 2018-05-01 NOTE — Therapy (Signed)
[facility] [address], [place], [address] Phone: [phone]   Fax:  [phone]  Physical Therapy Treatment  Patient Details  Name: Danny Maldonado MRN: [id number] Date of Birth: 08/04/1956 Referring Provider: Daun Peacock MD   Encounter Date: 05/01/2018  PT End of Session - 05/01/18 1103    Visit Number  2    Number of Visits  13    Date for PT Re-Evaluation  06/06/18    PT Start Time  1030    PT Stop Time  1115    PT Time Calculation (min)  45 min    Activity Tolerance  Patient tolerated treatment well    Behavior During Therapy  [facility] for tasks assessed/performed       Past Medical History:  Diagnosis Date  . Allergy   . Cancer ([place])   . Diabetes mellitus without complication ([place])   . DJD (degenerative joint disease)   . Gastritis   . GERD (gastroesophageal reflux disease)   . Hyperlipidemia   . Sleep apnea     Past Surgical History:  Procedure Laterality Date  . CARDIAC CATHETERIZATION    . CHOLECYSTECTOMY    . COLONOSCOPY WITH PROPOFOL N/A 09/26/2017   Procedure: COLONOSCOPY WITH PROPOFOL;  Surgeon: Manya Silvas, MD;  Location: [facility] ENDOSCOPY;  Service: Endoscopy;  Laterality: N/A;  . HERNIA REPAIR    . right trigger thumb release    . VASECTOMY      There were no vitals filed for this visit.  Subjective Assessment - 05/01/18 1046    Subjective  Patient reports he has been performing his exercises at home the past 3 days. Patient reports a light pain in the back currently.     Pertinent History  DM II, sleep apnea, knee pain     Limitations  Lifting;Walking    How long can you stand comfortably?  72min    How long can you walk comfortably?  10 min     Patient Stated Goals  To decrease pain     Currently in Pain?  Yes    Pain Score  2     Pain Location  Back    Pain Orientation  Lower;Right    Pain Descriptors / Indicators  Aching    Pain Type  Chronic pain    Pain Onset   More than a month ago       TREATMENT Therapeutic Exercise: LTRs in hookyling - x 2 min Pelvic tilts in sitting for education of movement - x 5  Knees to chest with physioball - x 2 min  Bridges with feet on the physioball - x 20  Dead bug marches - 2 x 5  Hip abduction in standing - 2 x 10  Hip extension in standing - 2 x 10  Squats in standing with B UE support - 2 x 10  Patient demonstrates increased fatigue at the end of the session    PT Education - 05/01/18 1052    Education Details  form/technique with exercise; HEP: hip abduction/extension     Person(s) Educated  Patient    Methods  Explanation;Demonstration    Comprehension  Verbalized understanding;Returned demonstration          PT Long Term Goals - 04/25/18 1246      PT LONG TERM GOAL #1   Title  Patient will be independent with HEP to continue benefits of therapy after discharge.    Baseline  Dependent with exercise form/technique     Time  6    Period  Weeks    Target Date  06/06/18      PT LONG TERM GOAL #2   Title  Patient will be able to ambulate for over 1 hour without increase in pain to better be able to perform recreational exercises without increase in pain    Baseline  59min    Time  6    Period  Weeks    Status  New    Target Date  06/06/18      PT LONG TERM GOAL #3   Title  Patient will be able able to stand without increase in pain for over 1 hour to better allow for performance of household activities such as cleaning and performing food preparation.     Baseline  4min    Time  6    Period  Weeks    Status  New    Target Date  06/06/18            Plan - 05/01/18 1253    Clinical Impression Statement  Patient demonstrates improvement with exercise coordination today versus previous sessions indicating functional carryover between treatment sessions. However patient continues to have increased baseline pain and increased difficulty with performing functional activities such as  squatting without increased knee flexion indicating poor motor control and patient will benefit from further skilled therapy to return to prior level of function.     Rehab Potential  Good    Clinical Impairments Affecting Rehab Potential  (+) highly motivated (-) obesity    PT Frequency  2x / week    PT Duration  6 weeks    PT Treatment/Interventions  Electrical Stimulation;Cryotherapy;Iontophoresis 4mg /ml Dexamethasone;Moist Heat;Ultrasound;Traction;Gait training;Stair training;Therapeutic activities;Therapeutic exercise;Balance training;Patient/family education;Neuromuscular re-education;Manual techniques;Dry needling;Passive range of motion    PT Next Visit Plan  continue improving mobility    PT Home Exercise Plan  see education section    Consulted and Agree with Plan of Care  Patient       Patient will benefit from skilled therapeutic intervention in order to improve the following deficits and impairments:  Increased muscle spasms, Pain, Impaired sensation, Decreased coordination, Decreased mobility, Decreased endurance, Decreased range of motion, Decreased strength, Decreased activity tolerance, Difficulty walking  Visit Diagnosis: Chronic right-sided low back pain without sciatica  Muscle weakness (generalized)     Problem List Patient Active Problem List   Diagnosis Date Noted  . Diabetes mellitus type 2, uncomplicated ([place]) 96/78/9381  . DJD (degenerative joint disease) 06/27/2016  . Hyperlipidemia, unspecified 06/27/2016  . Hypertension 06/27/2016  . Sleep apnea 06/27/2016  . Syncope and collapse 05/10/2016    Blythe Stanford, PT DPT 05/01/2018, 12:56 PM  [facility] [address], [place], [address] Phone: [phone]   Fax:  [phone]  Name: FITZHUGH VIZCARRONDO MRN: [id number] Date of Birth: August 07, 1956

## 2018-05-07 ENCOUNTER — Ambulatory Visit: Payer: 59

## 2018-05-07 DIAGNOSIS — M545 Low back pain: Principal | ICD-10-CM

## 2018-05-07 DIAGNOSIS — G8929 Other chronic pain: Secondary | ICD-10-CM | POA: Diagnosis not present

## 2018-05-07 DIAGNOSIS — M6281 Muscle weakness (generalized): Secondary | ICD-10-CM | POA: Diagnosis not present

## 2018-05-07 NOTE — Therapy (Signed)
[facility] [address], [place], [address] Phone: [phone]   Fax:  [phone]  Physical Therapy Treatment  Patient Details  Name: Danny Maldonado MRN: [id number] Date of Birth: 1956/06/10 Referring Provider: Daun Peacock MD   Encounter Date: 05/07/2018  PT End of Session - 05/07/18 1533    Visit Number  3    Number of Visits  13    Date for PT Re-Evaluation  06/06/18    PT Start Time  0768    PT Stop Time  1600    PT Time Calculation (min)  45 min    Activity Tolerance  Patient tolerated treatment well    Behavior During Therapy  [facility] for tasks assessed/performed       Past Medical History:  Diagnosis Date  . Allergy   . Cancer ([place])   . Diabetes mellitus without complication ([place])   . DJD (degenerative joint disease)   . Gastritis   . GERD (gastroesophageal reflux disease)   . Hyperlipidemia   . Sleep apnea     Past Surgical History:  Procedure Laterality Date  . CARDIAC CATHETERIZATION    . CHOLECYSTECTOMY    . COLONOSCOPY WITH PROPOFOL N/A 09/26/2017   Procedure: COLONOSCOPY WITH PROPOFOL;  Surgeon: Manya Silvas, MD;  [facility] ENDOSCOPY;  Service: Endoscopy;  Laterality: N/A;  . HERNIA REPAIR    . right trigger thumb release    . VASECTOMY      There were no vitals filed for this visit.  Subjective Assessment - 05/07/18 1527    Subjective  Patient reports he does not know whether the exercises are helping yet but states they have definitely not been hurting. Patient reports he was able to walk a longer period of time once last week.     Pertinent History  DM II, sleep apnea, knee pain     Limitations  Lifting;Walking    How long can you stand comfortably?  77min    How long can you walk comfortably?  10 min     Patient Stated Goals  To decrease pain     Currently in Pain?  Yes    Pain Score  1     Pain Location  Back    Pain Orientation  Lower;Right    Pain  Descriptors / Indicators  Aching    Pain Type  Chronic pain    Pain Onset  More than a month ago    Pain Frequency  Intermittent       TREATMENT Therapeutic Exercise: Standing hip/lumbar extension CKC - x 20  Knees to chest with physioball - x 2 min  LTRs in hookyling - x 1 min; on physioball - x 2 min Bridges in hookyling - x 20  Pelvic tilts in hooklying - x 20 Hip abduction in standing - x 10, x15 55# B   Manual Therapy: STM performed to the multifidus with patient positioned in side lying to decrease increased spasms and pain. Lumbar rotation in sidelying for rotational lumbar improvement - 2 x 30sec to decrease pain  Patient demonstrates increased fatigue at the end of the session    PT Education - 05/07/18 1532    Education Details  form/technique with exercise    Person(s) Educated  Patient    Methods  Explanation;Demonstration    Comprehension  Verbalized understanding;Returned demonstration          PT Long Term Goals - 04/25/18 1246  PT LONG TERM GOAL #1   Title  Patient will be independent with HEP to continue benefits of therapy after discharge.    Baseline  Dependent with exercise form/technique     Time  6    Period  Weeks    Target Date  06/06/18      PT LONG TERM GOAL #2   Title  Patient will be able to ambulate for over 1 hour without increase in pain to better be able to perform recreational exercises without increase in pain    Baseline  31min    Time  6    Period  Weeks    Status  New    Target Date  06/06/18      PT LONG TERM GOAL #3   Title  Patient will be able able to stand without increase in pain for over 1 hour to better allow for performance of household activities such as cleaning and performing food preparation.     Baseline  27min    Time  6    Period  Weeks    Status  New    Target Date  06/06/18            Plan - 05/07/18 1554    Clinical Impression Statement  Continued to focus on improving hip strengthening and  multifidus strengthening as patient is having increased pain and difficulty with performing increased ambulation. Patient demonstrates difficulty with hip abduction exercises requiring cueing to maintain a neutral pelvic and spinal posture during exercise performance indicating poor motor control. Patient will benefit from further skilled therapy to return to prior level of function.     Rehab Potential  Good    Clinical Impairments Affecting Rehab Potential  (+) highly motivated (-) obesity    PT Frequency  2x / week    PT Duration  6 weeks    PT Treatment/Interventions  Electrical Stimulation;Cryotherapy;Iontophoresis 4mg /ml Dexamethasone;Moist Heat;Ultrasound;Traction;Gait training;Stair training;Therapeutic activities;Therapeutic exercise;Balance training;Patient/family education;Neuromuscular re-education;Manual techniques;Dry needling;Passive range of motion    PT Next Visit Plan  continue improving mobility    PT Home Exercise Plan  see education section    Consulted and Agree with Plan of Care  Patient       Patient will benefit from skilled therapeutic intervention in order to improve the following deficits and impairments:  Increased muscle spasms, Pain, Impaired sensation, Decreased coordination, Decreased mobility, Decreased endurance, Decreased range of motion, Decreased strength, Decreased activity tolerance, Difficulty walking  Visit Diagnosis: Chronic right-sided low back pain without sciatica  Muscle weakness (generalized)     Problem List Patient Active Problem List   Diagnosis Date Noted  . Diabetes mellitus type 2, uncomplicated ([place]) 37/62/8315  . DJD (degenerative joint disease) 06/27/2016  . Hyperlipidemia, unspecified 06/27/2016  . Hypertension 06/27/2016  . Sleep apnea 06/27/2016  . Syncope and collapse 05/10/2016    Blythe Stanford, PT DPT 05/07/2018, 4:24 PM  [facility] [address], [place], [address] Phone: [phone]   Fax:  [phone]  Name: Danny Maldonado MRN: [id number] Date of Birth: 02/09/1956

## 2018-05-09 ENCOUNTER — Ambulatory Visit: Payer: 59

## 2018-05-09 DIAGNOSIS — M545 Low back pain, unspecified: Secondary | ICD-10-CM

## 2018-05-09 DIAGNOSIS — G8929 Other chronic pain: Secondary | ICD-10-CM

## 2018-05-09 DIAGNOSIS — M6281 Muscle weakness (generalized): Secondary | ICD-10-CM

## 2018-05-09 NOTE — Therapy (Signed)
[place] [facility] [address] [place], [address] Phone: [phone]   Fax:  [phone]  Physical Therapy Treatment  Patient Details  Name: Danny Maldonado MRN: [id number] Date of Birth: [birth year] Referring Provider: Daun Peacock MD   Encounter Date: 05/09/2018  PT End of Session - 05/09/18 1513    Visit Number  4    Number of Visits  13    Date for PT Re-Evaluation  06/06/18    PT Start Time  3845    PT Stop Time  1500    PT Time Calculation (min)  45 min    Activity Tolerance  Patient tolerated treatment well    Behavior During Therapy  [facility] for tasks assessed/performed       Past Medical History:  Diagnosis Date  . Allergy   . Cancer ([place])   . Diabetes mellitus without complication ([place])   . DJD (degenerative joint disease)   . Gastritis   . GERD (gastroesophageal reflux disease)   . Hyperlipidemia   . Sleep apnea     Past Surgical History:  Procedure Laterality Date  . CARDIAC CATHETERIZATION    . CHOLECYSTECTOMY    . COLONOSCOPY WITH PROPOFOL N/A 09/26/2017   Procedure: COLONOSCOPY WITH PROPOFOL;  Surgeon: Manya Silvas, MD;  Location: [facility] ENDOSCOPY;  Service: Endoscopy;  Laterality: N/A;  . HERNIA REPAIR    . right trigger thumb release    . VASECTOMY      There were no vitals filed for this visit.  Subjective Assessment - 05/09/18 1419    Subjective  Patient reports increased he conitnues to have increased pain when walking but reports the pain is not as intense when walking. Patient states he has nbot been waling as long because of the increased heat/humitiy.     Pertinent History  DM II, sleep apnea, knee pain     Limitations  Lifting;Walking    How long can you stand comfortably?  51min    How long can you walk comfortably?  10 min     Patient Stated Goals  To decrease pain     Currently in Pain?  Yes    Pain Score  1     Pain Location  Back    Pain Orientation  Lower;Right     Pain Descriptors / Indicators  Aching    Pain Type  Chronic pain    Pain Onset  More than a month ago    Pain Frequency  Intermittent       TREATMENT Therapeutic Exercise: Hip abduction in standing at machine -  x15 55# B Hip extension in standing at machine - x 15 55# B Standing palof press at mahine - x 15  Standing hip/lumbar extension CKC - x 20 with R sided lateral movement Squats in standing with B UE support - 2 x 20  Standing hip extension in standing - 2 x 20    Manual Therapy: STM performed to the multifidus with patient positioned in side lying to decrease increased spasms and pain. Lumbar rotation in sidelying for rotational lumbar improvement - 2 x 30sec to decrease pain   Patient demonstrates increased fatigue at the end of the session   PT Education - 05/09/18 1513    Education Details  form/technique with exercise     Person(s) Educated  Patient    Methods  Explanation;Demonstration    Comprehension  Verbalized understanding;Returned demonstration  PT Long Term Goals - 04/25/18 1246      PT LONG TERM GOAL #1   Title  Patient will be independent with HEP to continue benefits of therapy after discharge.    Baseline  Dependent with exercise form/technique     Time  6    Period  Weeks    Target Date  06/06/18      PT LONG TERM GOAL #2   Title  Patient will be able to ambulate for over 1 hour without increase in pain to better be able to perform recreational exercises without increase in pain    Baseline  34min    Time  6    Period  Weeks    Status  New    Target Date  06/06/18      PT LONG TERM GOAL #3   Title  Patient will be able able to stand without increase in pain for over 1 hour to better allow for performance of household activities such as cleaning and performing food preparation.     Baseline  26min    Time  6    Period  Weeks    Status  New    Target Date  06/06/18            Plan - 05/09/18 1514    Clinical  Impression Statement  Patient demonstrates improvement with ability to perform greater amount of standing exercises. Patient demonstrates decreased pain and spasms after performing lumbar rotation and extension indicating poor motor control. Patient will benefit from further skilled therapy to return to prior level of function.     Rehab Potential  Good    Clinical Impairments Affecting Rehab Potential  (+) highly motivated (-) obesity    PT Frequency  2x / week    PT Duration  6 weeks    PT Treatment/Interventions  Electrical Stimulation;Cryotherapy;Iontophoresis 4mg /ml Dexamethasone;Moist Heat;Ultrasound;Traction;Gait training;Stair training;Therapeutic activities;Therapeutic exercise;Balance training;Patient/family education;Neuromuscular re-education;Manual techniques;Dry needling;Passive range of motion    PT Next Visit Plan  continue improving mobility    PT Home Exercise Plan  see education section    Consulted and Agree with Plan of Care  Patient       Patient will benefit from skilled therapeutic intervention in order to improve the following deficits and impairments:  Increased muscle spasms, Pain, Impaired sensation, Decreased coordination, Decreased mobility, Decreased endurance, Decreased range of motion, Decreased strength, Decreased activity tolerance, Difficulty walking  Visit Diagnosis: Muscle weakness (generalized)  Chronic right-sided low back pain without sciatica     Problem List Patient Active Problem List   Diagnosis Date Noted  . Diabetes mellitus type 2, uncomplicated ([place]) 49/67/5916  . DJD (degenerative joint disease) 06/27/2016  . Hyperlipidemia, unspecified 06/27/2016  . Hypertension 06/27/2016  . Sleep apnea 06/27/2016  . Syncope and collapse 05/10/2016    Blythe Stanford, PT DPT 05/09/2018, 3:21 PM  [place] [facility] [address], [place], [address] Phone: [phone]   Fax:   [phone]  Name: Danny Maldonado MRN: [id number] Date of Birth: May 10, 1956

## 2018-05-13 ENCOUNTER — Ambulatory Visit: Payer: 59

## 2018-05-13 DIAGNOSIS — M545 Low back pain: Secondary | ICD-10-CM

## 2018-05-13 DIAGNOSIS — M6281 Muscle weakness (generalized): Secondary | ICD-10-CM

## 2018-05-13 DIAGNOSIS — G8929 Other chronic pain: Secondary | ICD-10-CM | POA: Diagnosis not present

## 2018-05-13 NOTE — Therapy (Signed)
[place] [facility] [address], [place], [address] Phone: [phone]   Fax:  [phone]  Physical Therapy Treatment  Patient Details  Name: Danny Maldonado MRN: [id number] Date of Birth: 1956/01/17 Referring Provider: Daun Peacock MD   Encounter Date: 05/13/2018  PT End of Session - 05/13/18 1358    Visit Number  5    Number of Visits  13    Date for PT Re-Evaluation  06/06/18    PT Start Time  8588    PT Stop Time  1430    PT Time Calculation (min)  45 min    Activity Tolerance  Patient tolerated treatment well    Behavior During Therapy  [facility] for tasks assessed/performed       Past Medical History:  Diagnosis Date  . Allergy   . Cancer ([place])   . Diabetes mellitus without complication ([place])   . DJD (degenerative joint disease)   . Gastritis   . GERD (gastroesophageal reflux disease)   . Hyperlipidemia   . Sleep apnea     Past Surgical History:  Procedure Laterality Date  . CARDIAC CATHETERIZATION    . CHOLECYSTECTOMY    . COLONOSCOPY WITH PROPOFOL N/A 09/26/2017   Procedure: COLONOSCOPY WITH PROPOFOL;  Surgeon: Manya Silvas, MD;  Location: [facility] ENDOSCOPY;  Service: Endoscopy;  Laterality: N/A;  . HERNIA REPAIR    . right trigger thumb release    . VASECTOMY      There were no vitals filed for this visit.  Subjective Assessment - 05/13/18 1346    Subjective  Patient reports increased pain with walking but states he did the exercises which helped with the pain overall. Patient states the pain is not as intense as it was.     Pertinent History  DM II, sleep apnea, knee pain     Limitations  Lifting;Walking    How long can you stand comfortably?  75min    How long can you walk comfortably?  10 min     Patient Stated Goals  To decrease pain     Currently in Pain?  Yes    Pain Score  1     Pain Location  Back    Pain Orientation  Lower;Right    Pain Descriptors / Indicators  Aching    Pain  Type  Chronic pain    Pain Onset  More than a month ago    Pain Frequency  Intermittent       TREATMENT Therapeutic Exercise: Deadlift in standing with 20# -- x 15  Unilateral farmers carry with 20# -- x 265ft Hip abduction in standing at machine -  x15 55# B Sitting prayer stretch - x 20 with physioball  Squats in standing with B UE support - 2 x 20 with RTB around knees Standing hip/lumbar extension CKC - x 20 with R sided lateral movement Ambulation with foucs on performing hip IR in standing - x 211ft     Patient demonstrates increased fatigue at the end of the session   PT Education - 05/13/18 1357    Education Details  form/technique with exercise    Person(s) Educated  Patient    Methods  Explanation;Demonstration    Comprehension  Verbalized understanding;Returned demonstration          PT Long Term Goals - 04/25/18 1246      PT LONG TERM GOAL #1   Title  Patient will be independent with HEP to  continue benefits of therapy after discharge.    Baseline  Dependent with exercise form/technique     Time  6    Period  Weeks    Target Date  06/06/18      PT LONG TERM GOAL #2   Title  Patient will be able to ambulate for over 1 hour without increase in pain to better be able to perform recreational exercises without increase in pain    Baseline  64min    Time  6    Period  Weeks    Status  New    Target Date  06/06/18      PT LONG TERM GOAL #3   Title  Patient will be able able to stand without increase in pain for over 1 hour to better allow for performance of household activities such as cleaning and performing food preparation.     Baseline  18min    Time  6    Period  Weeks    Status  New    Target Date  06/06/18            Plan - 05/13/18 1406    Clinical Impression Statement  Patient demonstrates improved ability to perform exercises with greater amount of resistance compared to previous sessions indicating functional carryover over the sessions.  Patient demonstrates increased hip ER on the R LE versus the L indicating poor hip strengthening and coordination. Patient will benefit from further skilled therapy to return to prior level of function.     Rehab Potential  Good    Clinical Impairments Affecting Rehab Potential  (+) highly motivated (-) obesity    PT Frequency  2x / week    PT Duration  6 weeks    PT Treatment/Interventions  Electrical Stimulation;Cryotherapy;Iontophoresis 4mg /ml Dexamethasone;Moist Heat;Ultrasound;Traction;Gait training;Stair training;Therapeutic activities;Therapeutic exercise;Balance training;Patient/family education;Neuromuscular re-education;Manual techniques;Dry needling;Passive range of motion    PT Next Visit Plan  continue improving mobility    PT Home Exercise Plan  see education section    Consulted and Agree with Plan of Care  Patient       Patient will benefit from skilled therapeutic intervention in order to improve the following deficits and impairments:  Increased muscle spasms, Pain, Impaired sensation, Decreased coordination, Decreased mobility, Decreased endurance, Decreased range of motion, Decreased strength, Decreased activity tolerance, Difficulty walking  Visit Diagnosis: Muscle weakness (generalized)  Chronic right-sided low back pain without sciatica     Problem List Patient Active Problem List   Diagnosis Date Noted  . Diabetes mellitus type 2, uncomplicated ([place]) 07/62/2633  . DJD (degenerative joint disease) 06/27/2016  . Hyperlipidemia, unspecified 06/27/2016  . Hypertension 06/27/2016  . Sleep apnea 06/27/2016  . Syncope and collapse 05/10/2016    Blythe Stanford, PT DPT 05/13/2018, 2:33 PM  [place] [facility] [address], [place], [address] Phone: [phone]   Fax:  [phone]  Name: Danny Maldonado MRN: [id number] Date of Birth: 02/28/1956

## 2018-05-15 ENCOUNTER — Ambulatory Visit: Payer: 59

## 2018-05-15 DIAGNOSIS — M545 Low back pain, unspecified: Secondary | ICD-10-CM

## 2018-05-15 DIAGNOSIS — G8929 Other chronic pain: Secondary | ICD-10-CM | POA: Diagnosis not present

## 2018-05-15 DIAGNOSIS — M6281 Muscle weakness (generalized): Secondary | ICD-10-CM | POA: Diagnosis not present

## 2018-05-15 NOTE — Therapy (Signed)
[place] [facility] [address] [place], [address] Phone: [phone]   Fax:  [phone]  Physical Therapy Treatment  Patient Details  Name: Danny Maldonado MRN: [id number] Date of Birth: 05-29-1956 Referring Provider: Daun Peacock MD   Encounter Date: 05/15/2018  PT End of Session - 05/15/18 1418    Visit Number  6    Number of Visits  13    Date for PT Re-Evaluation  06/06/18    PT Start Time  1350    PT Stop Time  1430    PT Time Calculation (min)  40 min    Activity Tolerance  Patient tolerated treatment well    Behavior During Therapy  [facility] for tasks assessed/performed       Past Medical History:  Diagnosis Date  . Allergy   . Cancer ([place])   . Diabetes mellitus without complication ([place])   . DJD (degenerative joint disease)   . Gastritis   . GERD (gastroesophageal reflux disease)   . Hyperlipidemia   . Sleep apnea     Past Surgical History:  Procedure Laterality Date  . CARDIAC CATHETERIZATION    . CHOLECYSTECTOMY    . COLONOSCOPY WITH PROPOFOL N/A 09/26/2017   Procedure: COLONOSCOPY WITH PROPOFOL;  Surgeon: Manya Silvas, MD;  Location[facility] ENDOSCOPY;  Service: Endoscopy;  Laterality: N/A;  . HERNIA REPAIR    . right trigger thumb release    . VASECTOMY      There were no vitals filed for this visit.  Subjective Assessment - 05/15/18 1359    Subjective  Patient reports he was able to walk faster and longer compared to previous visits. Patient states he feels therapy is helping overall.     Pertinent History  DM II, sleep apnea, knee pain     Limitations  Lifting;Walking    How long can you stand comfortably?  47min    How long can you walk comfortably?  10 min     Patient Stated Goals  To decrease pain     Currently in Pain?  Yes    Pain Score  1     Pain Location  Back    Pain Orientation  Lower;Right    Pain Descriptors / Indicators  Aching    Pain Type  Chronic pain    Pain  Onset  More than a month ago    Pain Frequency  Intermittent       TREATMENT Therapeutic Exercise: Ball lifts with lifting overhead with physioball - x20 Ball twists in standing rotational holds with physioball - x 20  Standing hip abduction at machine - 2 x 20 with 55# Standing hip extension at machine - x20 85# LTRs with feet on ball - x 3 min Knees to chest with physioball - x 20  Bridges in hooklying - x 20  Squats in standing with UE support - x 30      Patient demonstrates increased fatigue at the end of the session   PT Education - 05/15/18 1417    Education Details  form/technique with exercise    Person(s) Educated  Patient    Methods  Explanation;Demonstration    Comprehension  Verbalized understanding;Returned demonstration          PT Long Term Goals - 04/25/18 1246      PT LONG TERM GOAL #1   Title  Patient will be independent with HEP to continue benefits of therapy after discharge.  Baseline  Dependent with exercise form/technique     Time  6    Period  Weeks    Target Date  06/06/18      PT LONG TERM GOAL #2   Title  Patient will be able to ambulate for over 1 hour without increase in pain to better be able to perform recreational exercises without increase in pain    Baseline  82min    Time  6    Period  Weeks    Status  New    Target Date  06/06/18      PT LONG TERM GOAL #3   Title  Patient will be able able to stand without increase in pain for over 1 hour to better allow for performance of household activities such as cleaning and performing food preparation.     Baseline  41min    Time  6    Period  Weeks    Status  New    Target Date  06/06/18            Plan - 05/15/18 1419    Clinical Impression Statement  Patient demonstrates improvement with low back exercises and does not demonstrate as much pain with exercise and when he does have pain, he is able to perform mobility exercises to further improve upon pain. Patient continues  to have difficulty with performing standing based exercises most significantly into lumbar extension. Patient will benefit from further skilled therapy to return to prior level of function.     Rehab Potential  Good    Clinical Impairments Affecting Rehab Potential  (+) highly motivated (-) obesity    PT Frequency  2x / week    PT Duration  6 weeks    PT Treatment/Interventions  Electrical Stimulation;Cryotherapy;Iontophoresis 4mg /ml Dexamethasone;Moist Heat;Ultrasound;Traction;Gait training;Stair training;Therapeutic activities;Therapeutic exercise;Balance training;Patient/family education;Neuromuscular re-education;Manual techniques;Dry needling;Passive range of motion    PT Next Visit Plan  continue improving mobility    PT Home Exercise Plan  see education section    Consulted and Agree with Plan of Care  Patient       Patient will benefit from skilled therapeutic intervention in order to improve the following deficits and impairments:  Increased muscle spasms, Pain, Impaired sensation, Decreased coordination, Decreased mobility, Decreased endurance, Decreased range of motion, Decreased strength, Decreased activity tolerance, Difficulty walking  Visit Diagnosis: Muscle weakness (generalized)  Chronic right-sided low back pain without sciatica     Problem List Patient Active Problem List   Diagnosis Date Noted  . Diabetes mellitus type 2, uncomplicated ([place]) 59/56/3875  . DJD (degenerative joint disease) 06/27/2016  . Hyperlipidemia, unspecified 06/27/2016  . Hypertension 06/27/2016  . Sleep apnea 06/27/2016  . Syncope and collapse 05/10/2016    Blythe Stanford, PT DPT 05/15/2018, 2:30 PM  [place] [facility] [address], [place], [address] Phone: [phone]   Fax:  [phone]  Name: HAGOP MCCOLLAM MRN: [id number] Date of Birth: 1956/03/24

## 2018-05-20 ENCOUNTER — Ambulatory Visit: Payer: 59

## 2018-05-20 DIAGNOSIS — J209 Acute bronchitis, unspecified: Secondary | ICD-10-CM | POA: Diagnosis not present

## 2018-05-20 DIAGNOSIS — M545 Low back pain, unspecified: Secondary | ICD-10-CM

## 2018-05-20 DIAGNOSIS — R05 Cough: Secondary | ICD-10-CM | POA: Diagnosis not present

## 2018-05-20 DIAGNOSIS — M6281 Muscle weakness (generalized): Secondary | ICD-10-CM | POA: Diagnosis not present

## 2018-05-20 DIAGNOSIS — G8929 Other chronic pain: Secondary | ICD-10-CM

## 2018-05-20 NOTE — Therapy (Signed)
[place] [facility] [address], [place], [address] Phone: [phone]   Fax:  [phone]  Physical Therapy Treatment  Patient Details  Name: SERAPIO EDELSON MRN: [id number] Date of Birth: 01-18-1956 Referring Provider: Daun Peacock MD   Encounter Date: 05/20/2018  PT End of Session - 05/20/18 1405    Visit Number  7    Number of Visits  13    Date for PT Re-Evaluation  06/06/18    PT Start Time  1761    PT Stop Time  1430    PT Time Calculation (min)  45 min    Activity Tolerance  Patient tolerated treatment well    Behavior During Therapy  [facility] for tasks assessed/performed       Past Medical History:  Diagnosis Date  . Allergy   . Cancer ([place])   . Diabetes mellitus without complication ([place])   . DJD (degenerative joint disease)   . Gastritis   . GERD (gastroesophageal reflux disease)   . Hyperlipidemia   . Sleep apnea     Past Surgical History:  Procedure Laterality Date  . CARDIAC CATHETERIZATION    . CHOLECYSTECTOMY    . COLONOSCOPY WITH PROPOFOL N/A 09/26/2017   Procedure: COLONOSCOPY WITH PROPOFOL;  Surgeon: Manya Silvas, MD;  Location: [facility] ENDOSCOPY;  Service: Endoscopy;  Laterality: N/A;  . HERNIA REPAIR    . right trigger thumb release    . VASECTOMY      There were no vitals filed for this visit.  Subjective Assessment - 05/20/18 1352    Subjective  Patient reports he feels the pain is improving and is able to walk for longer periods of time without increase in pain.     Pertinent History  DM II, sleep apnea, knee pain     Limitations  Lifting;Walking    How long can you stand comfortably?  80min    How long can you walk comfortably?  10 min     Patient Stated Goals  To decrease pain     Currently in Pain?  Yes    Pain Score  1     Pain Location  Back    Pain Orientation  Lower;Right    Pain Descriptors / Indicators  Aching    Pain Type  Chronic pain    Pain Onset  More than  a month ago    Pain Frequency  Intermittent       TREATMENT Therapeutic Exercise: Lumbar extension in standing CKC - x 20  Ball lifts with lifting overhead with physioball - x20 B Ball twists in standing rotational holds with physioball - x 20 B Paloff press with OMEGA - x 20 20#  Knees to chest with physioball - x 20  Standing hip abduction at machine - 2 x 20 with 70# Standing hip extension at machine - x20 100#     Patient demonstrates increased fatigue at the end of the session   PT Education - 05/20/18 1404    Education Details  form/technique with exercise    Person(s) Educated  Patient    Methods  Explanation;Demonstration    Comprehension  Verbalized understanding;Returned demonstration          PT Long Term Goals - 04/25/18 1246      PT LONG TERM GOAL #1   Title  Patient will be independent with HEP to continue benefits of therapy after discharge.    Baseline  Dependent with exercise form/technique  Time  6    Period  Weeks    Target Date  06/06/18      PT LONG TERM GOAL #2   Title  Patient will be able to ambulate for over 1 hour without increase in pain to better be able to perform recreational exercises without increase in pain    Baseline  70min    Time  6    Period  Weeks    Status  New    Target Date  06/06/18      PT LONG TERM GOAL #3   Title  Patient will be able able to stand without increase in pain for over 1 hour to better allow for performance of household activities such as cleaning and performing food preparation.     Baseline  1min    Time  6    Period  Weeks    Status  New    Target Date  06/06/18            Plan - 05/20/18 1407    Clinical Impression Statement  Patient demonstrates improvement with exercise performance with ability to perform greater amount of exercises before requiring performance of mobility exercises and rest breaks. Patient demonstrates increased pain with hip/lumbar extension. Patient will benefit from  further skilled therapy to return to prior level of function.     Rehab Potential  Good    Clinical Impairments Affecting Rehab Potential  (+) highly motivated (-) obesity    PT Frequency  2x / week    PT Duration  6 weeks    PT Treatment/Interventions  Electrical Stimulation;Cryotherapy;Iontophoresis 4mg /ml Dexamethasone;Moist Heat;Ultrasound;Traction;Gait training;Stair training;Therapeutic activities;Therapeutic exercise;Balance training;Patient/family education;Neuromuscular re-education;Manual techniques;Dry needling;Passive range of motion    PT Next Visit Plan  continue improving mobility    PT Home Exercise Plan  see education section    Consulted and Agree with Plan of Care  Patient       Patient will benefit from skilled therapeutic intervention in order to improve the following deficits and impairments:  Increased muscle spasms, Pain, Impaired sensation, Decreased coordination, Decreased mobility, Decreased endurance, Decreased range of motion, Decreased strength, Decreased activity tolerance, Difficulty walking  Visit Diagnosis: Muscle weakness (generalized)  Chronic right-sided low back pain without sciatica     Problem List Patient Active Problem List   Diagnosis Date Noted  . Diabetes mellitus type 2, uncomplicated ([place]) 62/26/3335  . DJD (degenerative joint disease) 06/27/2016  . Hyperlipidemia, unspecified 06/27/2016  . Hypertension 06/27/2016  . Sleep apnea 06/27/2016  . Syncope and collapse 05/10/2016    Blythe Stanford, PT DPT 05/20/2018, 2:26 PM  [place] [facility] [address], [place], [address] Phone: [phone]   Fax:  [phone]  Name: ALEKSANDAR DUVE MRN: [id number] Date of Birth: 1956/06/10

## 2018-05-22 ENCOUNTER — Ambulatory Visit: Payer: 59

## 2018-05-22 DIAGNOSIS — M6281 Muscle weakness (generalized): Secondary | ICD-10-CM | POA: Diagnosis not present

## 2018-05-22 DIAGNOSIS — G8929 Other chronic pain: Secondary | ICD-10-CM | POA: Diagnosis not present

## 2018-05-22 DIAGNOSIS — M545 Low back pain: Principal | ICD-10-CM

## 2018-05-22 NOTE — Therapy (Signed)
[facility] [address], [place], [address] Phone: [phone]   Fax:  [phone]  Physical Therapy Treatment  Patient Details  Name: Danny Maldonado MRN: [id number] Date of Birth: Apr 12, 1956 Referring Provider: Daun Peacock MD   Encounter Date: 05/22/2018  PT End of Session - 05/22/18 1407    Visit Number  8    Number of Visits  13    Date for PT Re-Evaluation  06/06/18    PT Start Time  9563    PT Stop Time  1430    PT Time Calculation (min)  45 min    Activity Tolerance  Patient tolerated treatment well    Behavior During Therapy  [facility] for tasks assessed/performed       Past Medical History:  Diagnosis Date  . Allergy   . Cancer ([place])   . Diabetes mellitus without complication ([place])   . DJD (degenerative joint disease)   . Gastritis   . GERD (gastroesophageal reflux disease)   . Hyperlipidemia   . Sleep apnea     Past Surgical History:  Procedure Laterality Date  . CARDIAC CATHETERIZATION    . CHOLECYSTECTOMY    . COLONOSCOPY WITH PROPOFOL N/A 09/26/2017   Procedure: COLONOSCOPY WITH PROPOFOL;  Surgeon: Manya Silvas, MD;  Location: [facility] ENDOSCOPY;  Service: Endoscopy;  Laterality: N/A;  . HERNIA REPAIR    . right trigger thumb release    . VASECTOMY      There were no vitals filed for this visit.  Subjective Assessment - 05/22/18 1403    Subjective  Patient reports increased pain during his walk this morning but reports he was able to walk for 3 laps around the park without stopping.     Pertinent History  DM II, sleep apnea, knee pain     Limitations  Lifting;Walking    How long can you stand comfortably?  62min    How long can you walk comfortably?  10 min     Patient Stated Goals  To decrease pain     Currently in Pain?  Yes    Pain Score  1     Pain Location  Back    Pain Orientation  Lower    Pain Descriptors / Indicators  Aching    Pain Type  Chronic pain    Pain Onset   More than a month ago       TREATMENT Therapeutic Exercise: Standing hip abduction at machine - x 25 with 55# Standing hip extension at machine - x20 55# Knees to chest with physioball - x 20  LTRs with feet on physioball - x 20  Seated prayer stretch with physioball - x 20  Standing lumbar rotation with arms at 90 flexion - x 20 with physioball Standing lumbar extension with arms at 90 flexion - x20 with physioball Lumbar extension in standing CKC - x 20  Squats in standing with UE support - x 20  Hip extension straight leg in standing - x 20      Patient demonstrates increased fatigue at the end of the session   PT Education - 05/22/18 1406    Education Details  form/technique with exercise    Person(s) Educated  Patient    Methods  Explanation;Demonstration    Comprehension  Verbalized understanding;Returned demonstration          PT Long Term Goals - 04/25/18 1246      PT LONG TERM GOAL #1  Title  Patient will be independent with HEP to continue benefits of therapy after discharge.    Baseline  Dependent with exercise form/technique     Time  6    Period  Weeks    Target Date  06/06/18      PT LONG TERM GOAL #2   Title  Patient will be able to ambulate for over 1 hour without increase in pain to better be able to perform recreational exercises without increase in pain    Baseline  40min    Time  6    Period  Weeks    Status  New    Target Date  06/06/18      PT LONG TERM GOAL #3   Title  Patient will be able able to stand without increase in pain for over 1 hour to better allow for performance of household activities such as cleaning and performing food preparation.     Baseline  96min    Time  6    Period  Weeks    Status  New    Target Date  06/06/18            Plan - 05/22/18 1412    Clinical Impression Statement  Patient demonstrates increased pain with performing rotations in standing and performed mobility non weight bearing exercises to  help decrease pain. Patient demonstrates ability to perform greater amount of exercise before onset of fatigue indicating functional carryover. Patient will benefit from further skilled therapy to return to prior level of function.     Rehab Potential  Good    Clinical Impairments Affecting Rehab Potential  (+) highly motivated (-) obesity    PT Frequency  2x / week    PT Duration  6 weeks    PT Treatment/Interventions  Electrical Stimulation;Cryotherapy;Iontophoresis 4mg /ml Dexamethasone;Moist Heat;Ultrasound;Traction;Gait training;Stair training;Therapeutic activities;Therapeutic exercise;Balance training;Patient/family education;Neuromuscular re-education;Manual techniques;Dry needling;Passive range of motion    PT Next Visit Plan  continue improving mobility    PT Home Exercise Plan  see education section    Consulted and Agree with Plan of Care  Patient       Patient will benefit from skilled therapeutic intervention in order to improve the following deficits and impairments:  Increased muscle spasms, Pain, Impaired sensation, Decreased coordination, Decreased mobility, Decreased endurance, Decreased range of motion, Decreased strength, Decreased activity tolerance, Difficulty walking  Visit Diagnosis: Chronic right-sided low back pain without sciatica  Muscle weakness (generalized)     Problem List Patient Active Problem List   Diagnosis Date Noted  . Diabetes mellitus type 2, uncomplicated ([place]) 32/20/2542  . DJD (degenerative joint disease) 06/27/2016  . Hyperlipidemia, unspecified 06/27/2016  . Hypertension 06/27/2016  . Sleep apnea 06/27/2016  . Syncope and collapse 05/10/2016    Blythe Stanford, PT DPT  05/22/2018, 2:23 PM  [facility] [address] [place], [address] Phone: [phone]   Fax:  [phone]  Name: Danny Maldonado MRN: [id number] Date of Birth: Oct 29, 1955

## 2018-05-27 ENCOUNTER — Ambulatory Visit: Payer: 59

## 2018-05-27 DIAGNOSIS — M6281 Muscle weakness (generalized): Secondary | ICD-10-CM

## 2018-05-27 DIAGNOSIS — M545 Low back pain: Principal | ICD-10-CM

## 2018-05-27 DIAGNOSIS — G8929 Other chronic pain: Secondary | ICD-10-CM

## 2018-05-27 NOTE — Therapy (Signed)
[facility] [address] [place], [address] Phone: [phone]   Fax:  [phone]  Physical Therapy Treatment  Patient Details  Name: Danny Maldonado MRN: [id number] Date of Birth: [birth year] Referring Provider (PT): Daun Peacock MD   Encounter Date: 05/27/2018  PT End of Session - 05/27/18 1408    Visit Number  9    Number of Visits  13    Date for PT Re-Evaluation  06/06/18    PT Start Time  6468    PT Stop Time  1430    PT Time Calculation (min)  45 min    Activity Tolerance  Patient tolerated treatment well    Behavior During Therapy  [facility] for tasks assessed/performed       Past Medical History:  Diagnosis Date  . Allergy   . Cancer ([place])   . Diabetes mellitus without complication ([place])   . DJD (degenerative joint disease)   . Gastritis   . GERD (gastroesophageal reflux disease)   . Hyperlipidemia   . Sleep apnea     Past Surgical History:  Procedure Laterality Date  . CARDIAC CATHETERIZATION    . CHOLECYSTECTOMY    . COLONOSCOPY WITH PROPOFOL N/A 09/26/2017   Procedure: COLONOSCOPY WITH PROPOFOL;  Surgeon: Manya Silvas, MD;  Location: [facility] ENDOSCOPY;  Service: Endoscopy;  Laterality: N/A;  . HERNIA REPAIR    . right trigger thumb release    . VASECTOMY      There were no vitals filed for this visit.  Subjective Assessment - 05/27/18 1404    Subjective  Patient states increased LBP this weekend after sitting for 5 hours and bringing in his luggage with his daughter this weekend.     Pertinent History  DM II, sleep apnea, knee pain     Limitations  Lifting;Walking    How long can you stand comfortably?  40min    How long can you walk comfortably?  10 min     Patient Stated Goals  To decrease pain     Currently in Pain?  Yes    Pain Score  1     Pain Location  Back    Pain Orientation  Lower    Pain Descriptors / Indicators  Aching    Pain Type  Chronic pain    Pain Onset   More than a month ago    Pain Frequency  Intermittent         TREATMENT Therapeutic Exercise: Standing hip abduction at machine - x 25 with 55# Standing hip extension at machine - x40 85# B Seated prayer stretch with physioball - x 20  Lumbar extension in standing CKC - x 20  Squats in standing with UE support - x 20  Standing lumbar rotations diagonals with physioball - x 20 B    Patient demonstrates increased fatigue at the end of the session    PT Education - 05/27/18 1408    Education Details  form/technique with exercise    Person(s) Educated  Patient    Methods  Explanation;Demonstration    Comprehension  Verbalized understanding;Returned demonstration          PT Long Term Goals - 04/25/18 1246      PT LONG TERM GOAL #1   Title  Patient will be independent with HEP to continue benefits of therapy after discharge.    Baseline  Dependent with exercise form/technique     Time  6  Period  Weeks    Target Date  06/06/18      PT LONG TERM GOAL #2   Title  Patient will be able to ambulate for over 1 hour without increase in pain to better be able to perform recreational exercises without increase in pain    Baseline  80min    Time  6    Period  Weeks    Status  New    Target Date  06/06/18      PT LONG TERM GOAL #3   Title  Patient will be able able to stand without increase in pain for over 1 hour to better allow for performance of household activities such as cleaning and performing food preparation.     Baseline  18min    Time  6    Period  Weeks    Status  New    Target Date  06/06/18            Plan - 05/27/18 1414    Clinical Impression Statement  Performed greater amount of mobility based exercises secondary to increased pain over the past 3 days. Patient demonstrates decreased pain at the end of the session indicating improvement in coordination and decreased spasms. Patient continues to improve with pain but demonstrates poor muscular  endurance. Patient will benefit from further skilled therapy to return to prior level of function.     Rehab Potential  Good    Clinical Impairments Affecting Rehab Potential  (+) highly motivated (-) obesity    PT Frequency  2x / week    PT Duration  6 weeks    PT Treatment/Interventions  Electrical Stimulation;Cryotherapy;Iontophoresis 4mg /ml Dexamethasone;Moist Heat;Ultrasound;Traction;Gait training;Stair training;Therapeutic activities;Therapeutic exercise;Balance training;Patient/family education;Neuromuscular re-education;Manual techniques;Dry needling;Passive range of motion    PT Next Visit Plan  continue improving mobility    PT Home Exercise Plan  see education section    Consulted and Agree with Plan of Care  Patient       Patient will benefit from skilled therapeutic intervention in order to improve the following deficits and impairments:  Increased muscle spasms, Pain, Impaired sensation, Decreased coordination, Decreased mobility, Decreased endurance, Decreased range of motion, Decreased strength, Decreased activity tolerance, Difficulty walking  Visit Diagnosis: Chronic right-sided low back pain without sciatica  Muscle weakness (generalized)     Problem List Patient Active Problem List   Diagnosis Date Noted  . Diabetes mellitus type 2, uncomplicated ([place]) 29/56/2130  . DJD (degenerative joint disease) 06/27/2016  . Hyperlipidemia, unspecified 06/27/2016  . Hypertension 06/27/2016  . Sleep apnea 06/27/2016  . Syncope and collapse 05/10/2016    Blythe Stanford, PT DPT 05/27/2018, 2:34 PM  [facility] [address] [place], [address] Phone: [phone]   Fax:  [phone]  Name: REYNALDO ROSSMAN MRN: [id number] Date of Birth: 05-Dec-1955

## 2018-05-29 ENCOUNTER — Ambulatory Visit: Payer: 59 | Attending: Neurosurgery

## 2018-05-29 DIAGNOSIS — M545 Low back pain, unspecified: Secondary | ICD-10-CM

## 2018-05-29 DIAGNOSIS — G8929 Other chronic pain: Secondary | ICD-10-CM | POA: Insufficient documentation

## 2018-05-29 DIAGNOSIS — M6281 Muscle weakness (generalized): Secondary | ICD-10-CM | POA: Diagnosis not present

## 2018-05-29 NOTE — Therapy (Signed)
[facility] [address], [place], [address] Phone: [phone]   Fax:  [phone]  Physical Therapy Treatment  Patient Details  Name: Danny Maldonado MRN: [id number] Date of Birth: [birth year] Referring Provider (PT): Daun Peacock MD   Encounter Date: 05/29/2018  PT End of Session - 05/29/18 1401    Visit Number  10    Number of Visits  13    Date for PT Re-Evaluation  06/06/18    PT Start Time  3299    PT Stop Time  1430    PT Time Calculation (min)  45 min    Activity Tolerance  Patient tolerated treatment well    Behavior During Therapy  [facility] for tasks assessed/performed       Past Medical History:  Diagnosis Date  . Allergy   . Cancer ([place])   . Diabetes mellitus without complication ([place])   . DJD (degenerative joint disease)   . Gastritis   . GERD (gastroesophageal reflux disease)   . Hyperlipidemia   . Sleep apnea     Past Surgical History:  Procedure Laterality Date  . CARDIAC CATHETERIZATION    . CHOLECYSTECTOMY    . COLONOSCOPY WITH PROPOFOL N/A 09/26/2017   Procedure: COLONOSCOPY WITH PROPOFOL;  Surgeon: Manya Silvas, MD;  Location: [facility] ENDOSCOPY;  Service: Endoscopy;  Laterality: N/A;  . HERNIA REPAIR    . right trigger thumb release    . VASECTOMY      There were no vitals filed for this visit.  Subjective Assessment - 05/29/18 1358    Subjective  Patient reports the increased pain today that he had from the weekend which started 3 hours after his last apointment    Pertinent History  DM II, sleep apnea, knee pain     Limitations  Lifting;Walking    How long can you stand comfortably?  32min    How long can you walk comfortably?  10 min     Patient Stated Goals  To decrease pain     Currently in Pain?  Yes    Pain Score  4     Pain Location  Back    Pain Orientation  Lower    Pain Descriptors / Indicators  Aching    Pain Type  Chronic pain    Pain Onset  More than a  month ago    Pain Frequency  Intermittent       TREATMENT Therapeutic Exercise: Seated prayer stretch with physioball - x 20  Lumbar extension in standing CKC - x 20  Lumbar rotation against therapist resistance - x 20 Standing scapular retraction with rows - x 20 18# Lumbar/thoracic rotation with RTB - x 20  Straight arm push downs at [place] - x 20 18# Paloff press in standing at [place] - x 20 13# Standing rotation with RTB - x 20 Standing hip extension - x 20 with UE support   Patient demonstrates increased fatigue at the end of the session  PT Education - 05/29/18 1401    Education Details  form/technique with exercise    Person(s) Educated  Patient    Methods  Explanation;Demonstration    Comprehension  Verbalized understanding;Returned demonstration          PT Long Term Goals - 04/25/18 1246      PT LONG TERM GOAL #1   Title  Patient will be independent with HEP to continue benefits of therapy after discharge.  Baseline  Dependent with exercise form/technique     Time  6    Period  Weeks    Target Date  06/06/18      PT LONG TERM GOAL #2   Title  Patient will be able to ambulate for over 1 hour without increase in pain to better be able to perform recreational exercises without increase in pain    Baseline  20min    Time  6    Period  Weeks    Status  New    Target Date  06/06/18      PT LONG TERM GOAL #3   Title  Patient will be able able to stand without increase in pain for over 1 hour to better allow for performance of household activities such as cleaning and performing food preparation.     Baseline  24min    Time  6    Period  Weeks    Status  New    Target Date  06/06/18            Plan - 05/29/18 1411    Clinical Impression Statement  Patient demonstrates decreased pain and spasms after performing mobility based exercises and focused on improving mobility in all lumbar directions to decrease pain. Patient demonstrates decreased pain after  performing exercises focusing on improving lumbar rotation. Focused on improving lumbar rotation with standing motions to be added to exercises performed at home. Patient will benefit from further skilled therapy to return to prior level of function.     Rehab Potential  Good    Clinical Impairments Affecting Rehab Potential  (+) highly motivated (-) obesity    PT Frequency  2x / week    PT Duration  6 weeks    PT Treatment/Interventions  Electrical Stimulation;Cryotherapy;Iontophoresis 4mg /ml Dexamethasone;Moist Heat;Ultrasound;Traction;Gait training;Stair training;Therapeutic activities;Therapeutic exercise;Balance training;Patient/family education;Neuromuscular re-education;Manual techniques;Dry needling;Passive range of motion    PT Next Visit Plan  continue improving mobility    PT Home Exercise Plan  see education section    Consulted and Agree with Plan of Care  Patient       Patient will benefit from skilled therapeutic intervention in order to improve the following deficits and impairments:  Increased muscle spasms, Pain, Impaired sensation, Decreased coordination, Decreased mobility, Decreased endurance, Decreased range of motion, Decreased strength, Decreased activity tolerance, Difficulty walking, Obesity  Visit Diagnosis: Chronic right-sided low back pain without sciatica  Muscle weakness (generalized)     Problem List Patient Active Problem List   Diagnosis Date Noted  . Diabetes mellitus type 2, uncomplicated ([place]) 40/03/6760  . DJD (degenerative joint disease) 06/27/2016  . Hyperlipidemia, unspecified 06/27/2016  . Hypertension 06/27/2016  . Sleep apnea 06/27/2016  . Syncope and collapse 05/10/2016    Blythe Stanford,  PT DPT 05/29/2018, 2:30 PM  [facility] [address], [place], [address] Phone: [phone]   Fax:  [phone]  Name: Danny Maldonado MRN: [id number] Date of Birth:  1956/04/21

## 2018-06-03 ENCOUNTER — Ambulatory Visit: Payer: 59

## 2018-06-03 DIAGNOSIS — M545 Low back pain, unspecified: Secondary | ICD-10-CM

## 2018-06-03 DIAGNOSIS — M6281 Muscle weakness (generalized): Secondary | ICD-10-CM | POA: Diagnosis not present

## 2018-06-03 DIAGNOSIS — G8929 Other chronic pain: Secondary | ICD-10-CM | POA: Diagnosis not present

## 2018-06-03 NOTE — Therapy (Signed)
[facility] [address] [place], [address] Phone: [phone]   Fax:  [phone]  Physical Therapy Treatment  Patient Details  Name: Danny Maldonado MRN: [id number] Date of Birth: 1955-11-05 Referring Provider (PT): Daun Peacock MD   Encounter Date: 06/03/2018  PT End of Session - 06/03/18 1355    Visit Number  11    Number of Visits  13    Date for PT Re-Evaluation  06/06/18    PT Start Time  2979    PT Stop Time  1430    PT Time Calculation (min)  43 min    Activity Tolerance  Patient tolerated treatment well    Behavior During Therapy  [facility] for tasks assessed/performed       Past Medical History:  Diagnosis Date  . Allergy   . Cancer ([place])   . Diabetes mellitus without complication ([place])   . DJD (degenerative joint disease)   . Gastritis   . GERD (gastroesophageal reflux disease)   . Hyperlipidemia   . Sleep apnea     Past Surgical History:  Procedure Laterality Date  . CARDIAC CATHETERIZATION    . CHOLECYSTECTOMY    . COLONOSCOPY WITH PROPOFOL N/A 09/26/2017   Procedure: COLONOSCOPY WITH PROPOFOL;  Surgeon: Manya Silvas, MD;  Location: [facility] ENDOSCOPY;  Service: Endoscopy;  Laterality: N/A;  . HERNIA REPAIR    . right trigger thumb release    . VASECTOMY      There were no vitals filed for this visit.  Subjective Assessment - 06/03/18 1352    Subjective  Patient reports the pain has been better over the past couple of days. Patient states overall improvement although he continues to have pain.     Pertinent History  DM II, sleep apnea, knee pain     Limitations  Lifting;Walking    How long can you stand comfortably?  37min    How long can you walk comfortably?  10 min     Patient Stated Goals  To decrease pain     Currently in Pain?  Yes    Pain Score  1     Pain Location  Back    Pain Orientation  Lower    Pain Descriptors / Indicators  Aching    Pain Type  Chronic pain    Pain Onset  More than a month ago    Pain Frequency  Intermittent        TREATMENT Therapeutic Exercise: Seated prayer stretch with physioball - x 20  Standing lumbar extension with holding 3kg - x 20  Standing lumbar rotation in standing - x 20 Paloff press in standing at [place] - x 20 15# B Rotational lumbar upward chops with double black band - x 25 Hip abduction in standing at hip machine - x 20 70# Standing hip extension - x 25 with UE support with hip machine B 115# Sit to stands with holding 2kg ball -- x 8, x 10 with 3kg ball Lumbar rotation against therapist resistance - x 5 with 5 sec holds   Patient demonstrates increased fatigue at the end of the session   PT Education - 06/03/18 1355    Education Details  form/technique with exercise    Person(s) Educated  Patient    Methods  Explanation;Demonstration    Comprehension  Verbalized understanding;Returned demonstration          PT Long Term Goals - 04/25/18 1246  PT LONG TERM GOAL #1   Title  Patient will be independent with HEP to continue benefits of therapy after discharge.    Baseline  Dependent with exercise form/technique     Time  6    Period  Weeks    Target Date  06/06/18      PT LONG TERM GOAL #2   Title  Patient will be able to ambulate for over 1 hour without increase in pain to better be able to perform recreational exercises without increase in pain    Baseline  66min    Time  6    Period  Weeks    Status  New    Target Date  06/06/18      PT LONG TERM GOAL #3   Title  Patient will be able able to stand without increase in pain for over 1 hour to better allow for performance of household activities such as cleaning and performing food preparation.     Baseline  22min    Time  6    Period  Weeks    Status  New    Target Date  06/06/18            Plan - 06/03/18 1405    Clinical Impression Statement  Patient demonstrates improvement with exercises with ability to perform greater  amount of exercises before onset of fatigue. Continued to focus on improving rotational lumbar mobility against resistance to improve pain as the pain has been improving after performing rotational exercise. Patient will benefit from further skilled therapy to return to prior level of function.      Rehab Potential  Good    Clinical Impairments Affecting Rehab Potential  (+) highly motivated (-) obesity    PT Frequency  2x / week    PT Duration  6 weeks    PT Treatment/Interventions  Electrical Stimulation;Cryotherapy;Iontophoresis 4mg /ml Dexamethasone;Moist Heat;Ultrasound;Traction;Gait training;Stair training;Therapeutic activities;Therapeutic exercise;Balance training;Patient/family education;Neuromuscular re-education;Manual techniques;Dry needling;Passive range of motion    PT Next Visit Plan  continue improving mobility    PT Home Exercise Plan  see education section    Consulted and Agree with Plan of Care  Patient       Patient will benefit from skilled therapeutic intervention in order to improve the following deficits and impairments:  Increased muscle spasms, Pain, Impaired sensation, Decreased coordination, Decreased mobility, Decreased endurance, Decreased range of motion, Decreased strength, Decreased activity tolerance, Difficulty walking, Obesity  Visit Diagnosis: Chronic right-sided low back pain without sciatica  Muscle weakness (generalized)     Problem List Patient Active Problem List   Diagnosis Date Noted  . Diabetes mellitus type 2, uncomplicated ([place]) 14/78/2956  . DJD (degenerative joint disease) 06/27/2016  . Hyperlipidemia, unspecified 06/27/2016  . Hypertension 06/27/2016  . Sleep apnea 06/27/2016  . Syncope and collapse 05/10/2016    Danny Maldonado, PT DPT 06/03/2018, 2:21 PM  [facility] [address] [place], [address] Phone: [phone]   Fax:  [phone]  Name: Danny Maldonado MRN: [id number] Date of Birth: 02/22/1956

## 2018-06-05 ENCOUNTER — Ambulatory Visit: Payer: 59

## 2018-06-05 DIAGNOSIS — M545 Low back pain, unspecified: Secondary | ICD-10-CM

## 2018-06-05 DIAGNOSIS — M5136 Other intervertebral disc degeneration, lumbar region: Secondary | ICD-10-CM | POA: Diagnosis not present

## 2018-06-05 DIAGNOSIS — M6281 Muscle weakness (generalized): Secondary | ICD-10-CM

## 2018-06-05 DIAGNOSIS — G8929 Other chronic pain: Secondary | ICD-10-CM | POA: Diagnosis not present

## 2018-06-05 DIAGNOSIS — M62830 Muscle spasm of back: Secondary | ICD-10-CM | POA: Diagnosis not present

## 2018-06-05 DIAGNOSIS — M48061 Spinal stenosis, lumbar region without neurogenic claudication: Secondary | ICD-10-CM | POA: Diagnosis not present

## 2018-06-05 NOTE — Therapy (Signed)
[place] [facility] [address] [place], [address] Phone: [phone]   Fax:  [phone]  Physical Therapy Treatment  Patient Details  Name: Danny Maldonado MRN: [id number] Date of Birth: 1955-10-21 Referring Provider (PT): Daun Peacock MD   Encounter Date: 06/05/2018  PT End of Session - 06/05/18 1417    Visit Number  12    Number of Visits  13    Date for PT Re-Evaluation  06/06/18    PT Start Time  8676    PT Stop Time  1430    PT Time Calculation (min)  45 min    Activity Tolerance  Patient tolerated treatment well    Behavior During Therapy  [facility] for tasks assessed/performed       Past Medical History:  Diagnosis Date  . Allergy   . Cancer ([place])   . Diabetes mellitus without complication ([place])   . DJD (degenerative joint disease)   . Gastritis   . GERD (gastroesophageal reflux disease)   . Hyperlipidemia   . Sleep apnea     Past Surgical History:  Procedure Laterality Date  . CARDIAC CATHETERIZATION    . CHOLECYSTECTOMY    . COLONOSCOPY WITH PROPOFOL N/A 09/26/2017   Procedure: COLONOSCOPY WITH PROPOFOL;  Surgeon: Manya Silvas, MD;  Location: [facility]OSCOPY;  Service: Endoscopy;  Laterality: N/A;  . HERNIA REPAIR    . right trigger thumb release    . VASECTOMY      There were no vitals filed for this visit.  Subjective Assessment - 06/05/18 1354    Subjective  Patient reports the pain has been about the same since the previous session. Patient states he had went walking today but stopped after the second lap becuase he was concerned becuase of increased pain.    Pertinent History  DM II, sleep apnea, knee pain     Limitations  Lifting;Walking    How long can you stand comfortably?  41min    How long can you walk comfortably?  10 min     Patient Stated Goals  To decrease pain     Currently in Pain?  Yes    Pain Score  1     Pain Location  Back    Pain Orientation  Lower    Pain  Descriptors / Indicators  Aching    Pain Type  Chronic pain    Pain Onset  More than a month ago    Pain Frequency  Intermittent         TREATMENT Therapeutic Exercise: Hip abduction in standing at hip machine - x 20 70# Standing hip extension - x 25 with UE support with hip machine B 130# Seated prayer stretch with physioball - x 20  Rotational lumbar upward chops with double black band - x 25 Standing lumbar extension with holding 3kg - x 20  Lumbar extension CKC in standing -x 30  Side stepping with RTB around ankles - 4 x 19ft with squatting each time Hip extension with LE straightening at the end of motion - x 20 Single leg heel taps off of 4" step - x 20   Patient demonstrates increased fatigue at the end of the session    PT Education - 06/05/18 1409    Education Details  form/technique with exercise    Person(s) Educated  Patient    Methods  Explanation;Demonstration    Comprehension  Verbalized understanding;Returned demonstration  PT Long Term Goals - 04/25/18 1246      PT LONG TERM GOAL #1   Title  Patient will be independent with HEP to continue benefits of therapy after discharge.    Baseline  Dependent with exercise form/technique     Time  6    Period  Weeks    Target Date  06/06/18      PT LONG TERM GOAL #2   Title  Patient will be able to ambulate for over 1 hour without increase in pain to better be able to perform recreational exercises without increase in pain    Baseline  7min    Time  6    Period  Weeks    Status  New    Target Date  06/06/18      PT LONG TERM GOAL #3   Title  Patient will be able able to stand without increase in pain for over 1 hour to better allow for performance of household activities such as cleaning and performing food preparation.     Baseline  59min    Time  6    Period  Weeks    Status  New    Target Date  06/06/18            Plan - 06/05/18 1427    Clinical Impression Statement  Patient  demonstrates poor hip control with perfoming hip hinging most notably with single leg stance. Patient demonstrates improvement with performing rotatoinal exercises with ability to go through greater AROM before onset of pain; however patient continues to have increased fatigue with exercises. Patient will benefit from further skilled therapy to return to prior level of function.     Rehab Potential  Good    Clinical Impairments Affecting Rehab Potential  (+) highly motivated (-) obesity    PT Frequency  2x / week    PT Duration  6 weeks    PT Treatment/Interventions  Electrical Stimulation;Cryotherapy;Iontophoresis 4mg /ml Dexamethasone;Moist Heat;Ultrasound;Traction;Gait training;Stair training;Therapeutic activities;Therapeutic exercise;Balance training;Patient/family education;Neuromuscular re-education;Manual techniques;Dry needling;Passive range of motion    PT Next Visit Plan  continue improving mobility    PT Home Exercise Plan  see education section    Consulted and Agree with Plan of Care  Patient       Patient will benefit from skilled therapeutic intervention in order to improve the following deficits and impairments:  Increased muscle spasms, Pain, Impaired sensation, Decreased coordination, Decreased mobility, Decreased endurance, Decreased range of motion, Decreased strength, Decreased activity tolerance, Difficulty walking, Obesity  Visit Diagnosis: Chronic right-sided low back pain without sciatica  Muscle weakness (generalized)     Problem List Patient Active Problem List   Diagnosis Date Noted  . Diabetes mellitus type 2, uncomplicated ([place]) 98/06/9146  . DJD (degenerative joint disease) 06/27/2016  . Hyperlipidemia, unspecified 06/27/2016  . Hypertension 06/27/2016  . Sleep apnea 06/27/2016  . Syncope and collapse 05/10/2016    Blythe Stanford, PT DPT 06/05/2018, 2:50 PM  [place] [facility] [address], [place], [address] Phone: [phone]   Fax:  [phone]  Name: Danny Maldonado MRN: [id number] Date of Birth: Oct 10, 1955

## 2018-06-12 ENCOUNTER — Ambulatory Visit: Payer: 59

## 2018-06-12 DIAGNOSIS — M545 Low back pain: Principal | ICD-10-CM

## 2018-06-12 DIAGNOSIS — M6281 Muscle weakness (generalized): Secondary | ICD-10-CM | POA: Diagnosis not present

## 2018-06-12 DIAGNOSIS — G8929 Other chronic pain: Secondary | ICD-10-CM | POA: Diagnosis not present

## 2018-06-12 NOTE — Therapy (Signed)
[place] [facility] [address], [place], [address] Phone: [phone]   Fax:  [phone]  Physical Therapy Treatment  Patient Details  Name: Danny Maldonado MRN: [id number] Date of Birth: Jan 01, 1956 Referring Provider (PT): Daun Peacock MD   Encounter Date: 06/12/2018  PT End of Session - 06/12/18 1617    Visit Number  13    Number of Visits  21    Date for PT Re-Evaluation  07/10/18    PT Start Time  1600    PT Stop Time  1645    PT Time Calculation (min)  45 min    Activity Tolerance  Patient tolerated treatment well    Behavior During Therapy  [facility] for tasks assessed/performed       Past Medical History:  Diagnosis Date  . Allergy   . Cancer ([place])   . Diabetes mellitus without complication ([place])   . DJD (degenerative joint disease)   . Gastritis   . GERD (gastroesophageal reflux disease)   . Hyperlipidemia   . Sleep apnea     Past Surgical History:  Procedure Laterality Date  . CARDIAC CATHETERIZATION    . CHOLECYSTECTOMY    . COLONOSCOPY WITH PROPOFOL N/A 09/26/2017   Procedure: COLONOSCOPY WITH PROPOFOL;  Surgeon: Manya Silvas, MD;  Location: [facility] ENDOSCOPY;  Service: Endoscopy;  Laterality: N/A;  . HERNIA REPAIR    . right trigger thumb release    . VASECTOMY      There were no vitals filed for this visit.  Subjective Assessment - 06/12/18 1559    Subjective  Patient reports the back has been feeling about the same since the previous session. Patient reports the pain is improved since starting therapy but reports increased difficulty with walking; states he  can walk for 34 minutes compared to the previous session.     Pertinent History  DM II, sleep apnea, knee pain     Limitations  Lifting;Walking    How long can you stand comfortably?  96min    How long can you walk comfortably?  10 min     Patient Stated Goals  To decrease pain     Currently in Pain?  Yes    Pain Score  1     Pain Location  Back    Pain Orientation  Lower    Pain Descriptors / Indicators  Aching    Pain Onset  More than a month ago    Pain Frequency  Intermittent       TREATMENT Therapeutic Exercise: Hip abduction in standing at hip machine - x 20 70# Standing hip extension - x 25 with UE support with hip machine B 130# Seated prayer stretch with physioball - x 20 with therapist pressure against back Rotational lumbar upward chops with 2kg   - x 25 Lumbar rotation with 10# weight - x 25  Standing lumbar extension with holding 3kg - x 20  Hip extension with LE straightening at the end of motion - x 20  Manual therapy STM performing to his multifidus on the R side to improve pain and spasms in sitting. STM performed will performing ball roll out in sitting   Patient demonstrates increased fatigue at the end of the session   PT Long Term Goals - 06/12/18 [place] #1   Title  Patient will be independent with HEP to continue benefits of therapy after discharge.  Baseline  Dependent with exercise form/technique; 06/12/2018: moderate cueing with exercises    Time  6    Period  Weeks    Status  On-going      PT LONG TERM GOAL #2   Title  Patient will be able to ambulate for over 1 hour without increase in pain to better be able to perform recreational exercises without increase in pain    Baseline  31min; 06/12/2018: 58min    Time  6    Period  Weeks    Status  On-going      PT LONG TERM GOAL #3   Title  Patient will be able able to stand without increase in pain for over 1 hour to better allow for performance of household activities such as cleaning and performing food preparation.     Baseline  63min; 06/12/2018: 8min    Time  6    Period  Weeks    Status  On-going            Plan - 06/12/18 1700    Clinical Impression Statement  Patient is improving towards long term goals with greater ability to ambulate and stand for longer periods of time without  increased pain and spasms. Patient demonstrates improvement with strengthening exercises with ability to perform greater amount of weight with exercises. Patient continues to have increased pain most notably with standing based exercise and will benefit from further skilled therapy to return to prior level of function.      Rehab Potential  Good    Clinical Impairments Affecting Rehab Potential  (+) highly motivated (-) obesity    PT Frequency  2x / week    PT Duration  6 weeks    PT Treatment/Interventions  Electrical Stimulation;Cryotherapy;Iontophoresis 4mg /ml Dexamethasone;Moist Heat;Ultrasound;Traction;Gait training;Stair training;Therapeutic activities;Therapeutic exercise;Balance training;Patient/family education;Neuromuscular re-education;Manual techniques;Dry needling;Passive range of motion    PT Next Visit Plan  continue improving mobility    PT Home Exercise Plan  see education section    Consulted and Agree with Plan of Care  Patient       Patient will benefit from skilled therapeutic intervention in order to improve the following deficits and impairments:  Increased muscle spasms, Pain, Impaired sensation, Decreased coordination, Decreased mobility, Decreased endurance, Decreased range of motion, Decreased strength, Decreased activity tolerance, Difficulty walking, Obesity  Visit Diagnosis: Chronic right-sided low back pain without sciatica  Muscle weakness (generalized)     Problem List Patient Active Problem List   Diagnosis Date Noted  . Diabetes mellitus type 2, uncomplicated ([place]) 73/41/9379  . DJD (degenerative joint disease) 06/27/2016  . Hyperlipidemia, unspecified 06/27/2016  . Hypertension 06/27/2016  . Sleep apnea 06/27/2016  . Syncope and collapse 05/10/2016    Danny Maldonado, PT DPT 06/12/2018, 5:12 PM  [place] [facility] [address] [place], [address] Phone: [phone]   Fax:   [phone]  Name: Danny Maldonado MRN: [id number] Date of Birth: [birth year]

## 2018-06-17 DIAGNOSIS — I10 Essential (primary) hypertension: Secondary | ICD-10-CM | POA: Diagnosis not present

## 2018-06-17 DIAGNOSIS — E119 Type 2 diabetes mellitus without complications: Secondary | ICD-10-CM | POA: Diagnosis not present

## 2018-06-17 DIAGNOSIS — G4733 Obstructive sleep apnea (adult) (pediatric): Secondary | ICD-10-CM | POA: Diagnosis not present

## 2018-06-19 ENCOUNTER — Ambulatory Visit: Payer: 59

## 2018-06-19 DIAGNOSIS — M6281 Muscle weakness (generalized): Secondary | ICD-10-CM

## 2018-06-19 DIAGNOSIS — G8929 Other chronic pain: Secondary | ICD-10-CM

## 2018-06-19 DIAGNOSIS — M545 Low back pain: Secondary | ICD-10-CM | POA: Diagnosis not present

## 2018-06-19 NOTE — Therapy (Signed)
[place] [facility] [address] [place], [address] Phone: [phone]   Fax:  [phone]  Physical Therapy Treatment  Patient Details  Name: Danny Maldonado MRN: [id number] Date of Birth: 02-Nov-1955 Referring Provider (PT): Daun Peacock MD   Encounter Date: 06/19/2018  PT End of Session - 06/19/18 1153    Visit Number  14    Number of Visits  21    Date for PT Re-Evaluation  07/10/18    PT Start Time  0263    PT Stop Time  1215    PT Time Calculation (min)  40 min    Activity Tolerance  Patient tolerated treatment well    Behavior During Therapy  [facility] for tasks assessed/performed       Past Medical History:  Diagnosis Date  . Allergy   . Cancer ([place])   . Diabetes mellitus without complication ([place])   . DJD (degenerative joint disease)   . Gastritis   . GERD (gastroesophageal reflux disease)   . Hyperlipidemia   . Sleep apnea     Past Surgical History:  Procedure Laterality Date  . CARDIAC CATHETERIZATION    . CHOLECYSTECTOMY    . COLONOSCOPY WITH PROPOFOL N/A 09/26/2017   Procedure: COLONOSCOPY WITH PROPOFOL;  Surgeon: Manya Silvas, MD;  Location: [facility] ENDOSCOPY;  Service: Endoscopy;  Laterality: N/A;  . HERNIA REPAIR    . right trigger thumb release    . VASECTOMY      There were no vitals filed for this visit.  Subjective Assessment - 06/19/18 1148    Subjective  Patient reports the pain has been feeling about the same compared to the prvious session. Patient states the pain has not worsened since the previous session.     Pertinent History  DM II, sleep apnea, knee pain     Limitations  Lifting;Walking    How long can you stand comfortably?  28min    How long can you walk comfortably?  10 min     Patient Stated Goals  To decrease pain     Currently in Pain?  Yes    Pain Score  1     Pain Location  Back    Pain Orientation  Lower    Pain Descriptors / Indicators  Aching    Pain Type   Chronic pain    Pain Onset  More than a month ago    Pain Frequency  Intermittent       TREATMENT Therapeutic Exercise: Hip abduction in standing at hip machine - x 20 40# Self-mobilization with softball in sitting - x 30  Seated prayer stretch with physioball - x 20 with therapist pressure against back Standing lumbar derotation - x 10 with double black band with arms straight Standing lumbar rotation with arms overhead - x 12 Standing lumbar rotation with arms at 90 degrees - x 10 B  Hip extension with LE straightening at the end of motion - x 20   Patient demonstrates increased fatigue at the end of the session   PT Education - 06/19/18 1152    Education Details  form/technique with exercise    Person(s) Educated  Patient    Methods  Explanation;Demonstration    Comprehension  Verbalized understanding;Returned demonstration          PT Long Term Goals - 06/12/18 1658      PT LONG TERM GOAL #1   Title  Patient will be independent with HEP  to continue benefits of therapy after discharge.    Baseline  Dependent with exercise form/technique; 06/12/2018: moderate cueing with exercises    Time  6    Period  Weeks    Status  On-going      PT LONG TERM GOAL #2   Title  Patient will be able to ambulate for over 1 hour without increase in pain to better be able to perform recreational exercises without increase in pain    Baseline  43min; 06/12/2018: 81min    Time  6    Period  Weeks    Status  On-going      PT LONG TERM GOAL #3   Title  Patient will be able able to stand without increase in pain for over 1 hour to better allow for performance of household activities such as cleaning and performing food preparation.     Baseline  75min; 06/12/2018: 65min    Time  6    Period  Weeks    Status  On-going            Plan - 06/19/18 1236    Clinical Impression Statement  Patient demonstrates decreased pain after performing exercises in standing most notably focusing  on lumbar rotation and abdominal bracing. Although patine tis improving he conotinues to have inceased painmost notably with perofrming prolonged walking activitiivies, this most likely due to poor core stabilization and poor motor control throughout lumbar rotators. Patient will benefit from further skilled therapy to return to prior level of function.     Rehab Potential  Good    Clinical Impairments Affecting Rehab Potential  (+) highly motivated (-) obesity    PT Frequency  2x / week    PT Duration  6 weeks    PT Treatment/Interventions  Electrical Stimulation;Cryotherapy;Iontophoresis 4mg /ml Dexamethasone;Moist Heat;Ultrasound;Traction;Gait training;Stair training;Therapeutic activities;Therapeutic exercise;Balance training;Patient/family education;Neuromuscular re-education;Manual techniques;Dry needling;Passive range of motion    PT Next Visit Plan  continue improving mobility    PT Home Exercise Plan  see education section    Consulted and Agree with Plan of Care  Patient       Patient will benefit from skilled therapeutic intervention in order to improve the following deficits and impairments:  Increased muscle spasms, Pain, Impaired sensation, Decreased coordination, Decreased mobility, Decreased endurance, Decreased range of motion, Decreased strength, Decreased activity tolerance, Difficulty walking, Obesity  Visit Diagnosis: Chronic right-sided low back pain without sciatica  Muscle weakness (generalized)     Problem List Patient Active Problem List   Diagnosis Date Noted  . Diabetes mellitus type 2, uncomplicated ([place]) 00/17/4944  . DJD (degenerative joint disease) 06/27/2016  . Hyperlipidemia, unspecified 06/27/2016  . Hypertension 06/27/2016  . Sleep apnea 06/27/2016  . Syncope and collapse 05/10/2016    Blythe Stanford, PT DPT 06/19/2018, 12:39 PM  [place] [facility] [address], [place],  [address] Phone: [phone]   Fax:  [phone]  Name: Danny Maldonado MRN: [id number] Date of Birth: 28-Oct-1955

## 2018-06-25 ENCOUNTER — Ambulatory Visit: Payer: 59

## 2018-06-25 DIAGNOSIS — M6281 Muscle weakness (generalized): Secondary | ICD-10-CM

## 2018-06-25 DIAGNOSIS — G8929 Other chronic pain: Secondary | ICD-10-CM

## 2018-06-25 DIAGNOSIS — M545 Low back pain: Secondary | ICD-10-CM | POA: Diagnosis not present

## 2018-06-25 NOTE — Therapy (Signed)
[place] [facility] [address] [place], [address] Phone: [phone]   Fax:  [phone]  Physical Therapy Treatment/ Discharge Summary   Patient Details  Name: Danny Maldonado MRN: [id number] Date of Birth: October 25, 1955 Referring Provider (PT): Daun Peacock MD   Encounter Date: 06/25/2018  PT End of Session - 06/25/18 1353    Visit Number  15    Number of Visits  21    Date for PT Re-Evaluation  07/10/18    PT Start Time  7893    PT Stop Time  1430    PT Time Calculation (min)  45 min    Activity Tolerance  Patient tolerated treatment well    Behavior During Therapy  [facility] for tasks assessed/performed       Past Medical History:  Diagnosis Date  . Allergy   . Cancer ([place])   . Diabetes mellitus without complication ([place])   . DJD (degenerative joint disease)   . Gastritis   . GERD (gastroesophageal reflux disease)   . Hyperlipidemia   . Sleep apnea     Past Surgical History:  Procedure Laterality Date  . CARDIAC CATHETERIZATION    . CHOLECYSTECTOMY    . COLONOSCOPY WITH PROPOFOL N/A 09/26/2017   Procedure: COLONOSCOPY WITH PROPOFOL;  Surgeon: Manya Silvas, MD;  Location: [facility] ENDOSCOPY;  Service: Endoscopy;  Laterality: N/A;  . HERNIA REPAIR    . right trigger thumb release    . VASECTOMY      There were no vitals filed for this visit.  Subjective Assessment - 06/25/18 1358    Subjective  Patient reports this is his last visit and states he is prepared for discahrge with an exercise plan.     Pertinent History  DM II, sleep apnea, knee pain     Limitations  Lifting;Walking    How long can you stand comfortably?  56mn    How long can you walk comfortably?  10 min     Patient Stated Goals  To decrease pain     Currently in Pain?  Yes    Pain Score  1     Pain Location  Back    Pain Orientation  Lower    Pain Descriptors / Indicators  Aching    Pain Type  Chronic pain    Pain Onset  More  than a month ago    Pain Frequency  Intermittent          TREATMENT Therapeutic Exercise: Hip abduction with GTB around ankles - x 30  Hip extension with GTB around ankles - x 30  Seated prayer stretch with physioball - x 20 with therapist pressure against back Squats in standing - x 20 Paloff Press with GTB - x 20  Lumbar rotations with GTB - x 20 upward chops  Patient demonstrates increased fatigue at the end of the session   PT Education - 06/25/18 1352    Education Details  form/technique with exercise    Person(s) Educated  Patient    Methods  Explanation;Demonstration    Comprehension  Verbalized understanding;Returned demonstration          PT Long Term Goals - 06/12/18 1658      PT LONG TERM GOAL #1   Title  Patient will be independent with HEP to continue benefits of therapy after discharge.    Baseline  Dependent with exercise form/technique; 06/12/2018: moderate cueing with exercises    Time  6  Period  Weeks    Status  On-going      PT LONG TERM GOAL #2   Title  Patient will be able to ambulate for over 1 hour without increase in pain to better be able to perform recreational exercises without increase in pain    Baseline  103mn; 06/12/2018: 3108m    Time  6    Period  Weeks    Status  On-going      PT LONG TERM GOAL #3   Title  Patient will be able able to stand without increase in pain for over 1 hour to better allow for performance of household activities such as cleaning and performing food preparation.     Baseline  1520m 06/12/2018: 14m57m  Time  6    Period  Weeks    Status  On-going            Plan - 06/25/18 1422    Clinical Impression Statement  Patient has met or partially met all long term goals and is prepared to discharge. Focused today on performing exercises to be added to HEP and patient demonstrates good technique requiring little to no cueing to perform. Patient reports he is better able to self manage his pain and perform  walking and exercises more since the start of therapy. Patient will benefit from further skilled therapy to return to prior level of function.     Rehab Potential  Good    Clinical Impairments Affecting Rehab Potential  (+) highly motivated (-) obesity    PT Frequency  2x / week    PT Duration  6 weeks    PT Treatment/Interventions  Electrical Stimulation;Cryotherapy;Iontophoresis 4mg/57mDexamethasone;Moist Heat;Ultrasound;Traction;Gait training;Stair training;Therapeutic activities;Therapeutic exercise;Balance training;Patient/family education;Neuromuscular re-education;Manual techniques;Dry needling;Passive range of motion    PT Next Visit Plan  continue improving mobility    PT Home Exercise Plan  see education section    Consulted and Agree with Plan of Care  Patient       Patient will benefit from skilled therapeutic intervention in order to improve the following deficits and impairments:  Increased muscle spasms, Pain, Impaired sensation, Decreased coordination, Decreased mobility, Decreased endurance, Decreased range of motion, Decreased strength, Decreased activity tolerance, Difficulty walking, Obesity  Visit Diagnosis: Chronic right-sided low back pain without sciatica  Muscle weakness (generalized)     Problem List Patient Active Problem List   Diagnosis Date Noted  . Diabetes mellitus type 2, uncomplicated ([facility]) [place]3182/08/1388JD (degenerative joint disease) 06/27/2016  . Hyperlipidemia, unspecified 06/27/2016  . Hypertension 06/27/2016  . Sleep apnea 06/27/2016  . Syncope and collapse 05/10/2016    WesleBlythe StanfordDPT  06/25/2018, 2:39 PM  [facility] [id number]: [phone]:  [phone]: RichaREUBIN BUSHNELL [phone] of Birth: [birth year]

## 2018-07-09 DIAGNOSIS — E7800 Pure hypercholesterolemia, unspecified: Secondary | ICD-10-CM | POA: Diagnosis not present

## 2018-07-09 DIAGNOSIS — R05 Cough: Secondary | ICD-10-CM | POA: Diagnosis not present

## 2018-07-09 DIAGNOSIS — Z23 Encounter for immunization: Secondary | ICD-10-CM | POA: Diagnosis not present

## 2018-07-09 DIAGNOSIS — Z79899 Other long term (current) drug therapy: Secondary | ICD-10-CM | POA: Diagnosis not present

## 2018-07-09 DIAGNOSIS — E119 Type 2 diabetes mellitus without complications: Secondary | ICD-10-CM | POA: Diagnosis not present

## 2018-07-09 DIAGNOSIS — I10 Essential (primary) hypertension: Secondary | ICD-10-CM | POA: Diagnosis not present

## 2018-07-10 DIAGNOSIS — R9389 Abnormal findings on diagnostic imaging of other specified body structures: Secondary | ICD-10-CM | POA: Diagnosis not present

## 2018-07-24 DIAGNOSIS — R918 Other nonspecific abnormal finding of lung field: Secondary | ICD-10-CM | POA: Diagnosis not present

## 2018-11-08 DIAGNOSIS — Z0000 Encounter for general adult medical examination without abnormal findings: Secondary | ICD-10-CM | POA: Diagnosis not present

## 2018-11-08 DIAGNOSIS — E118 Type 2 diabetes mellitus with unspecified complications: Secondary | ICD-10-CM | POA: Diagnosis not present

## 2018-11-08 DIAGNOSIS — G4733 Obstructive sleep apnea (adult) (pediatric): Secondary | ICD-10-CM | POA: Diagnosis not present

## 2018-11-08 DIAGNOSIS — I10 Essential (primary) hypertension: Secondary | ICD-10-CM | POA: Diagnosis not present

## 2018-11-12 ENCOUNTER — Other Ambulatory Visit: Payer: Self-pay | Admitting: Internal Medicine

## 2018-11-12 DIAGNOSIS — I10 Essential (primary) hypertension: Secondary | ICD-10-CM | POA: Diagnosis not present

## 2018-11-12 DIAGNOSIS — Z79899 Other long term (current) drug therapy: Secondary | ICD-10-CM | POA: Diagnosis not present

## 2018-11-12 DIAGNOSIS — E118 Type 2 diabetes mellitus with unspecified complications: Secondary | ICD-10-CM | POA: Diagnosis not present

## 2018-11-12 DIAGNOSIS — E7800 Pure hypercholesterolemia, unspecified: Secondary | ICD-10-CM | POA: Diagnosis not present

## 2018-11-12 DIAGNOSIS — R053 Chronic cough: Secondary | ICD-10-CM

## 2018-11-12 DIAGNOSIS — Z125 Encounter for screening for malignant neoplasm of prostate: Secondary | ICD-10-CM | POA: Diagnosis not present

## 2018-11-12 DIAGNOSIS — R05 Cough: Secondary | ICD-10-CM

## 2018-11-13 DIAGNOSIS — D1800 Hemangioma unspecified site: Secondary | ICD-10-CM | POA: Diagnosis not present

## 2018-11-13 DIAGNOSIS — L570 Actinic keratosis: Secondary | ICD-10-CM | POA: Diagnosis not present

## 2018-11-13 DIAGNOSIS — L812 Freckles: Secondary | ICD-10-CM | POA: Diagnosis not present

## 2018-11-13 DIAGNOSIS — Z85828 Personal history of other malignant neoplasm of skin: Secondary | ICD-10-CM | POA: Diagnosis not present

## 2018-11-13 DIAGNOSIS — Z1283 Encounter for screening for malignant neoplasm of skin: Secondary | ICD-10-CM | POA: Diagnosis not present

## 2018-11-13 DIAGNOSIS — D225 Melanocytic nevi of trunk: Secondary | ICD-10-CM | POA: Diagnosis not present

## 2018-11-13 DIAGNOSIS — L249 Irritant contact dermatitis, unspecified cause: Secondary | ICD-10-CM | POA: Diagnosis not present

## 2018-11-13 DIAGNOSIS — L578 Other skin changes due to chronic exposure to nonionizing radiation: Secondary | ICD-10-CM | POA: Diagnosis not present

## 2018-11-13 DIAGNOSIS — L918 Other hypertrophic disorders of the skin: Secondary | ICD-10-CM | POA: Diagnosis not present

## 2018-11-18 ENCOUNTER — Ambulatory Visit: Payer: 59

## 2018-11-18 MED FILL — FLUTICASONE PROP 50 MCG SPR: 50 | 30 days supply | Qty: 16 | Fill #0

## 2018-11-18 MED FILL — ACCU-CHEK FASTCLIX LANCETS: 90 days supply | Qty: 204 | Fill #0

## 2018-11-18 MED FILL — GLIMEPIRIDE 2 MG TABLET: 2 | 90 days supply | Qty: 90 | Fill #0

## 2018-11-26 MED FILL — ATORVASTATIN 10 MG TABLET: 10 | 90 days supply | Qty: 90 | Fill #0

## 2018-11-29 MED FILL — metFORMIN HCL 1000 MG TABS: 1000 | 90 days supply | Qty: 180 | Fill #0

## 2018-11-29 MED FILL — FLUTICASONE PROP 50 MCG SPR: 50 | 30 days supply | Qty: 16 | Fill #1

## 2018-12-10 DIAGNOSIS — E7800 Pure hypercholesterolemia, unspecified: Secondary | ICD-10-CM | POA: Diagnosis not present

## 2018-12-10 DIAGNOSIS — I10 Essential (primary) hypertension: Secondary | ICD-10-CM | POA: Diagnosis not present

## 2018-12-16 ENCOUNTER — Ambulatory Visit
Admission: RE | Admit: 2018-12-16 | Discharge: 2018-12-16 | Disposition: A | Discharge status: Home / Self Care | Payer: 59 | Source: Ambulatory Visit | Attending: Internal Medicine | Admitting: Internal Medicine

## 2018-12-16 ENCOUNTER — Other Ambulatory Visit: Payer: Self-pay

## 2018-12-16 DIAGNOSIS — R05 Cough: Secondary | ICD-10-CM | POA: Diagnosis not present

## 2018-12-16 DIAGNOSIS — J479 Bronchiectasis, uncomplicated: Secondary | ICD-10-CM | POA: Diagnosis not present

## 2018-12-16 DIAGNOSIS — R053 Chronic cough: Secondary | ICD-10-CM

## 2018-12-19 DIAGNOSIS — J470 Bronchiectasis with acute lower respiratory infection: Secondary | ICD-10-CM | POA: Diagnosis not present

## 2019-01-01 DIAGNOSIS — J479 Bronchiectasis, uncomplicated: Secondary | ICD-10-CM | POA: Diagnosis not present

## 2019-01-03 DIAGNOSIS — J470 Bronchiectasis with acute lower respiratory infection: Secondary | ICD-10-CM | POA: Diagnosis not present

## 2019-02-03 DIAGNOSIS — J470 Bronchiectasis with acute lower respiratory infection: Secondary | ICD-10-CM | POA: Diagnosis not present

## 2019-03-05 DIAGNOSIS — H2513 Age-related nuclear cataract, bilateral: Secondary | ICD-10-CM | POA: Diagnosis not present

## 2019-03-05 DIAGNOSIS — H524 Presbyopia: Secondary | ICD-10-CM | POA: Diagnosis not present

## 2019-03-05 DIAGNOSIS — H35033 Hypertensive retinopathy, bilateral: Secondary | ICD-10-CM | POA: Diagnosis not present

## 2019-03-05 DIAGNOSIS — H5203 Hypermetropia, bilateral: Secondary | ICD-10-CM | POA: Diagnosis not present

## 2019-03-05 DIAGNOSIS — Z7984 Long term (current) use of oral hypoglycemic drugs: Secondary | ICD-10-CM | POA: Diagnosis not present

## 2019-03-05 DIAGNOSIS — E119 Type 2 diabetes mellitus without complications: Secondary | ICD-10-CM | POA: Diagnosis not present

## 2019-03-05 DIAGNOSIS — J470 Bronchiectasis with acute lower respiratory infection: Secondary | ICD-10-CM | POA: Diagnosis not present

## 2019-03-05 DIAGNOSIS — H52223 Regular astigmatism, bilateral: Secondary | ICD-10-CM | POA: Diagnosis not present

## 2019-03-25 DIAGNOSIS — M5136 Other intervertebral disc degeneration, lumbar region: Secondary | ICD-10-CM | POA: Diagnosis not present

## 2019-03-25 DIAGNOSIS — M47816 Spondylosis without myelopathy or radiculopathy, lumbar region: Secondary | ICD-10-CM | POA: Diagnosis not present

## 2019-03-25 DIAGNOSIS — M5416 Radiculopathy, lumbar region: Secondary | ICD-10-CM | POA: Diagnosis not present

## 2019-04-05 DIAGNOSIS — J470 Bronchiectasis with acute lower respiratory infection: Secondary | ICD-10-CM | POA: Diagnosis not present

## 2019-04-09 DIAGNOSIS — H60331 Swimmer's ear, right ear: Secondary | ICD-10-CM | POA: Diagnosis not present

## 2019-04-09 DIAGNOSIS — L299 Pruritus, unspecified: Secondary | ICD-10-CM | POA: Diagnosis not present

## 2019-05-06 DIAGNOSIS — J470 Bronchiectasis with acute lower respiratory infection: Secondary | ICD-10-CM | POA: Diagnosis not present

## 2019-05-13 DIAGNOSIS — I10 Essential (primary) hypertension: Secondary | ICD-10-CM | POA: Diagnosis not present

## 2019-05-13 DIAGNOSIS — E118 Type 2 diabetes mellitus with unspecified complications: Secondary | ICD-10-CM | POA: Diagnosis not present

## 2019-05-13 DIAGNOSIS — E7800 Pure hypercholesterolemia, unspecified: Secondary | ICD-10-CM | POA: Diagnosis not present

## 2019-05-13 DIAGNOSIS — G4733 Obstructive sleep apnea (adult) (pediatric): Secondary | ICD-10-CM | POA: Diagnosis not present

## 2019-05-30 DIAGNOSIS — Z23 Encounter for immunization: Secondary | ICD-10-CM | POA: Diagnosis not present

## 2019-06-05 DIAGNOSIS — E7800 Pure hypercholesterolemia, unspecified: Secondary | ICD-10-CM | POA: Diagnosis not present

## 2019-06-05 DIAGNOSIS — J470 Bronchiectasis with acute lower respiratory infection: Secondary | ICD-10-CM | POA: Diagnosis not present

## 2019-06-05 DIAGNOSIS — E118 Type 2 diabetes mellitus with unspecified complications: Secondary | ICD-10-CM | POA: Diagnosis not present

## 2019-06-05 DIAGNOSIS — Z79899 Other long term (current) drug therapy: Secondary | ICD-10-CM | POA: Diagnosis not present

## 2019-06-05 DIAGNOSIS — I10 Essential (primary) hypertension: Secondary | ICD-10-CM | POA: Diagnosis not present

## 2019-06-12 DIAGNOSIS — I10 Essential (primary) hypertension: Secondary | ICD-10-CM | POA: Diagnosis not present

## 2019-06-12 DIAGNOSIS — E118 Type 2 diabetes mellitus with unspecified complications: Secondary | ICD-10-CM | POA: Diagnosis not present

## 2019-06-12 DIAGNOSIS — Z79899 Other long term (current) drug therapy: Secondary | ICD-10-CM | POA: Diagnosis not present

## 2019-07-02 ENCOUNTER — Telehealth: Payer: 59 | Admitting: Family

## 2019-07-02 DIAGNOSIS — R21 Rash and other nonspecific skin eruption: Secondary | ICD-10-CM | POA: Diagnosis not present

## 2019-07-02 MED ORDER — PREDNISONE 10 MG (21) PO TBPK: ORAL_TABLET | ORAL | 0 refills | Status: DC

## 2019-07-02 MED ORDER — TRIAMCINOLONE ACETONIDE 0.5 % EX OINT: 1.0000 "application " | TOPICAL_OINTMENT | Freq: Two times a day (BID) | CUTANEOUS | 0 refills | Status: DC

## 2019-07-02 NOTE — Progress Notes (Signed)
E Visit for Rash  We are sorry that you are not feeling well. Here is how we plan to help!  Based on what you shared with me it looks like you have contact dermatitis.  Contact dermatitis is a skin rash caused by something that touches the skin and causes irritation or inflammation.  Your skin may be red, swollen, dry, cracked, and itch.  The rash should go away in a few days but can last a few weeks.  If you get a rash, it's important to figure out what caused it so the irritant can be avoided in the future. and I have prescribed Prednisone 10 mg daily for 5 days and sent in kenalog cream that you will use twice a day.    HOME CARE:   Take cool showers and avoid direct sunlight.  Apply cool compress or wet dressings.  Take a bath in an oatmeal bath.  Sprinkle content of one Aveeno packet under running faucet with comfortably warm water.  Bathe for 15-20 minutes, 1-2 times daily.  Pat dry with a towel. Do not rub the rash.  Use hydrocortisone cream.  Take an antihistamine like Benadryl for widespread rashes that itch.  The adult dose of Benadryl is 25-50 mg by mouth 4 times daily.  Caution:  This type of medication may cause sleepiness.  Do not drink alcohol, drive, or operate dangerous machinery while taking antihistamines.  Do not take these medications if you have prostate enlargement.  Read package instructions thoroughly on all medications that you take.  GET HELP RIGHT AWAY IF:   Symptoms don't go away after treatment.  Severe itching that persists.  If you rash spreads or swells.  If you rash begins to smell.  If it blisters and opens or develops a yellow-brown crust.  You develop a fever.  You have a sore throat.  You become short of breath.  MAKE SURE YOU:  Understand these instructions. Will watch your condition. Will get help right away if you are not doing well or get worse.  Thank you for choosing an e-visit. Your e-visit answers were reviewed by a board  certified advanced clinical practitioner to complete your personal care plan. Depending upon the condition, your plan could have included both over the counter or prescription medications. Please review your pharmacy choice. Be sure that the pharmacy you have chosen is open so that you can pick up your prescription now.  If there is a problem you may message your provider in [place] to have the prescription routed to another pharmacy. Your safety is important to [place]. If you have drug allergies check your prescription carefully.  For the next 24 hours, you can use MyChart to ask questions about today's visit, request a non-urgent call back, or ask for a work or school excuse from your e-visit provider. You will get an email in the next two days asking about your experience. I hope that your e-visit has been valuable and will speed your recovery.    Approximately 5 minutes was spent documenting and reviewing patient's chart.

## 2019-07-07 DIAGNOSIS — Z1159 Encounter for screening for other viral diseases: Secondary | ICD-10-CM | POA: Diagnosis not present

## 2019-07-09 DIAGNOSIS — R05 Cough: Secondary | ICD-10-CM | POA: Diagnosis not present

## 2019-09-05 DIAGNOSIS — E118 Type 2 diabetes mellitus with unspecified complications: Secondary | ICD-10-CM | POA: Diagnosis not present

## 2019-09-05 DIAGNOSIS — Z79899 Other long term (current) drug therapy: Secondary | ICD-10-CM | POA: Diagnosis not present

## 2019-09-12 DIAGNOSIS — E118 Type 2 diabetes mellitus with unspecified complications: Secondary | ICD-10-CM | POA: Diagnosis not present

## 2019-09-12 DIAGNOSIS — I10 Essential (primary) hypertension: Secondary | ICD-10-CM | POA: Diagnosis not present

## 2019-09-12 DIAGNOSIS — E7800 Pure hypercholesterolemia, unspecified: Secondary | ICD-10-CM | POA: Diagnosis not present

## 2019-09-12 DIAGNOSIS — Z125 Encounter for screening for malignant neoplasm of prostate: Secondary | ICD-10-CM | POA: Diagnosis not present

## 2019-09-12 DIAGNOSIS — G4733 Obstructive sleep apnea (adult) (pediatric): Secondary | ICD-10-CM | POA: Diagnosis not present

## 2019-09-12 DIAGNOSIS — Z79899 Other long term (current) drug therapy: Secondary | ICD-10-CM | POA: Diagnosis not present

## 2019-10-05 ENCOUNTER — Ambulatory Visit: Payer: PPO | Attending: Internal Medicine

## 2019-10-05 DIAGNOSIS — Z23 Encounter for immunization: Secondary | ICD-10-CM | POA: Insufficient documentation

## 2019-10-05 NOTE — Progress Notes (Signed)
   Covid-19 Vaccination Clinic  Name:  Danny Maldonado    MRN: [id number] DOB: [birth year]  10/05/2019  Mr. Hagg was observed post Covid-19 immunization for 15 minutes without incidence. He was provided with Vaccine Information Sheet and instruction to access the V-Safe system.   Mr. Robel was instructed to call 911 with any severe reactions post vaccine: Marland Kitchen Difficulty breathing  . Swelling of your face and throat  . A fast heartbeat  . A bad rash all over your body  . Dizziness and weakness    Immunizations Administered    Name Date Dose VIS Date Route   Pfizer COVID-19 Vaccine 10/05/2019 12:21 PM 0.3 mL 08/08/2019 Intramuscular   Manufacturer: [place]   Lot: [id number]   [place]: [id number]

## 2019-10-29 ENCOUNTER — Ambulatory Visit: Payer: PPO | Attending: Internal Medicine

## 2019-10-29 DIAGNOSIS — Z23 Encounter for immunization: Secondary | ICD-10-CM | POA: Insufficient documentation

## 2019-10-29 NOTE — Progress Notes (Signed)
   Covid-19 Vaccination Clinic  Name:  Danny Maldonado    MRN: [id number] DOB: 09/29/1955  10/29/2019  [name] was observed post Covid-19 immunization for 15 minutes without incident. He was provided with Vaccine Information Sheet and instruction to access the V-Safe system.   [name] was instructed to call 911 with any severe reactions post vaccine: Marland Kitchen Difficulty breathing  . Swelling of face and throat  . A fast heartbeat  . A bad rash all over body  . Dizziness and weakness   Immunizations Administered    Name Date Dose VIS Date Route   Pfizer COVID-19 Vaccine 10/29/2019 11:05 AM 0.3 mL 08/08/2019 Intramuscular   Manufacturer: [place]   Lot: [id number]   [place]: [id number]

## 2019-11-17 ENCOUNTER — Ambulatory Visit: Payer: PPO | Admitting: Dermatology

## 2019-11-17 ENCOUNTER — Other Ambulatory Visit: Payer: Self-pay

## 2019-11-17 DIAGNOSIS — Z1283 Encounter for screening for malignant neoplasm of skin: Secondary | ICD-10-CM | POA: Diagnosis not present

## 2019-11-17 DIAGNOSIS — Z85828 Personal history of other malignant neoplasm of skin: Secondary | ICD-10-CM | POA: Diagnosis not present

## 2019-11-17 DIAGNOSIS — L918 Other hypertrophic disorders of the skin: Secondary | ICD-10-CM

## 2019-11-17 DIAGNOSIS — D225 Melanocytic nevi of trunk: Secondary | ICD-10-CM

## 2019-11-17 DIAGNOSIS — L570 Actinic keratosis: Secondary | ICD-10-CM | POA: Diagnosis not present

## 2019-11-17 DIAGNOSIS — R21 Rash and other nonspecific skin eruption: Secondary | ICD-10-CM

## 2019-11-17 DIAGNOSIS — D229 Melanocytic nevi, unspecified: Secondary | ICD-10-CM | POA: Diagnosis not present

## 2019-11-17 DIAGNOSIS — L578 Other skin changes due to chronic exposure to nonionizing radiation: Secondary | ICD-10-CM

## 2019-11-17 DIAGNOSIS — L821 Other seborrheic keratosis: Secondary | ICD-10-CM | POA: Diagnosis not present

## 2019-11-17 NOTE — Patient Instructions (Addendum)
Wound Care Instructions  1. Cleanse wound gently with soap and water once a day then pat dry with clean gauze. Apply a thing coat of Petrolatum (petroleum jelly, "Vaseline") over the wound (unless you have an allergy to this). We recommend that you use a new, sterile tube of Vaseline. Do not pick or remove scabs. Do not remove the yellow or white "healing tissue" from the base of the wound.  2. Cover the wound with fresh, clean, nonstick gauze and secure with paper tape. You may use Band-Aids in place of gauze and tape if the would is small enough, but would recommend trimming much of the tape off as there is often too much. Sometimes Band-Aids can irritate the skin.  3. You should call the office for your biopsy report after 1 week if you have not already been contacted.  4. If you experience any problems, such as abnormal amounts of bleeding, swelling, significant bruising, significant pain, or evidence of infection, please call the office immediately.  5. FOR ADULT SURGERY PATIENTS: If you need something for pain relief you may take 1 extra strength Tylenol (acetaminophen) AND 2 Ibuprofen (200mg  each) together every 4 hours as needed for pain. (do not take these if you are allergic to them or if you have a reason you should not take them.) Typically, you may only need pain medication for 1 to 3 days.   Recommend Sarna, [facility] for itch.

## 2019-11-17 NOTE — Progress Notes (Signed)
   Follow-Up Visit   Subjective  Danny Maldonado is a 64 y.o. male who presents for the following: Annual Exam (Spot on bottom lip, feels like a hard spot. Present for a few months with no change.) and Rash (Came up in the fall 2020, periodically itches. Pt thinks he may have come in contact with poison ivy. ).  The following portions of the chart were reviewed this encounter and updated as appropriate:     Review of Systems: No other skin or systemic complaints.  Objective  Well appearing patient in no apparent distress; mood and affect are within normal limits.  A full examination was performed including scalp, head, eyes, ears, nose, lips, neck, chest, axillae, abdomen, back, buttocks, bilateral upper extremities, bilateral lower extremities, hands, feet, fingers, toes, fingernails, and toenails. All findings within normal limits unless otherwise noted below.  Objective  R lower lip, R ear mid helix, R temple (3): Erythematous thin papules/macules with gritty scale.  Vs CNCH at R ear mid helix  Objective  face, trunk, ext: Actinic changes   Objective  Right Temple: Well healed scar with no evidence of recurrence, no lymphadenopathy.   Objective  trunk, ext: Tan-brown and/or pink-flesh-colored symmetric macules and papules.   Objective  B/L arms, abdomen: Mild pinkness on wrists, abdomen.   Objective  trunk, ext: Stuck-on, waxy, tan-brown papules and plaques -- Discussed benign etiology and prognosis.   Objective  B/L Axilla: Fleshy, skin-colored pedunculated papules.    Assessment & Plan  AK (actinic keratosis) (3) R lower lip, R ear mid helix, R temple  Destruction of lesion - R lower lip, R ear mid helix, R temple Complexity: simple   Destruction method: cryotherapy   Informed consent: discussed and consent obtained   Timeout:  patient name, date of birth, surgical site, and procedure verified Lesion destroyed using liquid nitrogen: Yes   Region frozen  until ice ball extended beyond lesion: Yes   Outcome: patient tolerated procedure well with no complications   Post-procedure details: wound care instructions given    Actinic skin damage face, trunk, ext  Recommend broad spectrum SPF 30 or greater.   History of SCC (squamous cell carcinoma) of skin Right Temple  No evidence of recurrence, call clinic for new or changing lesions.   Nevus trunk, ext  Benign-appearing.  Observation   Rash B/L arms, abdomen  Contact Dermatitis resolved but with some persistent hypersensitivity. Recommend OTC Sarna.   Seborrheic keratosis trunk, ext  Benign, observe.    Skin cancer screening all over skin exam for skin cancer screening  Skin tag B/L Axilla  Benign, observe.    Return in about 3 months (around 02/17/2020) for recheck AK's, rash.   Graciella Belton, RMA, am acting as scribe for Sarina Ser, MD .

## 2019-12-09 DIAGNOSIS — I700 Atherosclerosis of aorta: Secondary | ICD-10-CM | POA: Diagnosis not present

## 2019-12-09 DIAGNOSIS — G4733 Obstructive sleep apnea (adult) (pediatric): Secondary | ICD-10-CM | POA: Diagnosis not present

## 2019-12-09 DIAGNOSIS — I2510 Atherosclerotic heart disease of native coronary artery without angina pectoris: Secondary | ICD-10-CM | POA: Insufficient documentation

## 2019-12-09 DIAGNOSIS — E7800 Pure hypercholesterolemia, unspecified: Secondary | ICD-10-CM | POA: Diagnosis not present

## 2019-12-09 DIAGNOSIS — E118 Type 2 diabetes mellitus with unspecified complications: Secondary | ICD-10-CM | POA: Diagnosis not present

## 2019-12-09 DIAGNOSIS — I10 Essential (primary) hypertension: Secondary | ICD-10-CM | POA: Diagnosis not present

## 2019-12-29 DIAGNOSIS — Z79899 Other long term (current) drug therapy: Secondary | ICD-10-CM | POA: Diagnosis not present

## 2019-12-29 DIAGNOSIS — Z125 Encounter for screening for malignant neoplasm of prostate: Secondary | ICD-10-CM | POA: Diagnosis not present

## 2019-12-29 DIAGNOSIS — E7800 Pure hypercholesterolemia, unspecified: Secondary | ICD-10-CM | POA: Diagnosis not present

## 2019-12-29 DIAGNOSIS — I10 Essential (primary) hypertension: Secondary | ICD-10-CM | POA: Diagnosis not present

## 2019-12-29 DIAGNOSIS — E118 Type 2 diabetes mellitus with unspecified complications: Secondary | ICD-10-CM | POA: Diagnosis not present

## 2020-01-05 ENCOUNTER — Other Ambulatory Visit: Payer: Self-pay | Admitting: Internal Medicine

## 2020-01-05 DIAGNOSIS — Z79899 Other long term (current) drug therapy: Secondary | ICD-10-CM | POA: Diagnosis not present

## 2020-01-05 DIAGNOSIS — E119 Type 2 diabetes mellitus without complications: Secondary | ICD-10-CM | POA: Diagnosis not present

## 2020-01-05 DIAGNOSIS — Z0000 Encounter for general adult medical examination without abnormal findings: Secondary | ICD-10-CM | POA: Diagnosis not present

## 2020-01-05 DIAGNOSIS — E7800 Pure hypercholesterolemia, unspecified: Secondary | ICD-10-CM | POA: Diagnosis not present

## 2020-01-05 DIAGNOSIS — I10 Essential (primary) hypertension: Secondary | ICD-10-CM | POA: Diagnosis not present

## 2020-01-05 DIAGNOSIS — J479 Bronchiectasis, uncomplicated: Secondary | ICD-10-CM | POA: Diagnosis not present

## 2020-01-05 DIAGNOSIS — E118 Type 2 diabetes mellitus with unspecified complications: Secondary | ICD-10-CM | POA: Diagnosis not present

## 2020-01-05 DIAGNOSIS — I2510 Atherosclerotic heart disease of native coronary artery without angina pectoris: Secondary | ICD-10-CM | POA: Diagnosis not present

## 2020-01-06 ENCOUNTER — Other Ambulatory Visit: Payer: Self-pay | Admitting: Pulmonary Disease

## 2020-01-06 DIAGNOSIS — G4733 Obstructive sleep apnea (adult) (pediatric): Secondary | ICD-10-CM | POA: Diagnosis not present

## 2020-01-06 DIAGNOSIS — Z9989 Dependence on other enabling machines and devices: Secondary | ICD-10-CM | POA: Diagnosis not present

## 2020-02-18 ENCOUNTER — Other Ambulatory Visit: Payer: Self-pay

## 2020-02-18 ENCOUNTER — Ambulatory Visit: Payer: PPO | Admitting: Dermatology

## 2020-02-18 DIAGNOSIS — L578 Other skin changes due to chronic exposure to nonionizing radiation: Secondary | ICD-10-CM | POA: Diagnosis not present

## 2020-02-18 DIAGNOSIS — L570 Actinic keratosis: Secondary | ICD-10-CM | POA: Diagnosis not present

## 2020-02-18 NOTE — Progress Notes (Signed)
   Follow-Up Visit   Subjective  Danny Maldonado is a 64 y.o. male who presents for the following: Actinic Keratosis (63m f/u R lower lip, R ear mid helix, R temple).  The following portions of the chart were reviewed this encounter and updated as appropriate:  Tobacco  Allergies  Meds  Problems  Med Hx  Surg Hx  Fam Hx      Review of Systems:  No other skin or systemic complaints except as noted in HPI or Assessment and Plan.  Objective  Well appearing patient in no apparent distress; mood and affect are within normal limits.  A focused examination was performed including face, ears. Relevant physical exam findings are noted in the Assessment and Plan.  Objective  R ear mid helix x 1, R temple x 1, R lower lip x 1 (3): Residual pink scaly macules   Assessment & Plan    Actinic Damage - diffuse scaly erythematous macules with underlying dyspigmentation - Recommend daily broad spectrum sunscreen SPF 30+ to sun-exposed areas, reapply every 2 hours as needed.  - Call for new or changing lesions.   AK (actinic keratosis) (3) R ear mid helix x 1, R temple x 1, R lower lip x 1  Residual AKs  May consider topical txt on f/u if not resolved  Destruction of lesion - R ear mid helix x 1, R temple x 1, R lower lip x 1 Complexity: simple   Destruction method: cryotherapy   Informed consent: discussed and consent obtained   Timeout:  patient name, date of birth, surgical site, and procedure verified Lesion destroyed using liquid nitrogen: Yes   Region frozen until ice ball extended beyond lesion: Yes   Outcome: patient tolerated procedure well with no complications   Post-procedure details: wound care instructions given    Return in about 2 months (around 04/19/2020) for recheck AKs.   I, Othelia Pulling, RMA, am acting as scribe for Sarina Ser, MD .  Documentation: I have reviewed the above documentation for accuracy and completeness, and I agree with the above.  Sarina Ser, MD

## 2020-02-19 DIAGNOSIS — G4733 Obstructive sleep apnea (adult) (pediatric): Secondary | ICD-10-CM | POA: Diagnosis not present

## 2020-02-22 DIAGNOSIS — G4733 Obstructive sleep apnea (adult) (pediatric): Secondary | ICD-10-CM | POA: Diagnosis not present

## 2020-02-24 ENCOUNTER — Encounter: Payer: Self-pay | Admitting: Dermatology

## 2020-03-24 DIAGNOSIS — G4733 Obstructive sleep apnea (adult) (pediatric): Secondary | ICD-10-CM | POA: Diagnosis not present

## 2020-04-05 DIAGNOSIS — Z79899 Other long term (current) drug therapy: Secondary | ICD-10-CM | POA: Diagnosis not present

## 2020-04-05 DIAGNOSIS — E118 Type 2 diabetes mellitus with unspecified complications: Secondary | ICD-10-CM | POA: Diagnosis not present

## 2020-04-08 DIAGNOSIS — J470 Bronchiectasis with acute lower respiratory infection: Secondary | ICD-10-CM | POA: Diagnosis not present

## 2020-04-12 DIAGNOSIS — I2510 Atherosclerotic heart disease of native coronary artery without angina pectoris: Secondary | ICD-10-CM | POA: Diagnosis not present

## 2020-04-12 DIAGNOSIS — R55 Syncope and collapse: Secondary | ICD-10-CM | POA: Diagnosis not present

## 2020-04-12 DIAGNOSIS — E118 Type 2 diabetes mellitus with unspecified complications: Secondary | ICD-10-CM | POA: Diagnosis not present

## 2020-04-12 DIAGNOSIS — E7800 Pure hypercholesterolemia, unspecified: Secondary | ICD-10-CM | POA: Diagnosis not present

## 2020-04-12 DIAGNOSIS — I10 Essential (primary) hypertension: Secondary | ICD-10-CM | POA: Diagnosis not present

## 2020-04-13 ENCOUNTER — Other Ambulatory Visit: Payer: Self-pay | Admitting: Internal Medicine

## 2020-04-13 DIAGNOSIS — R55 Syncope and collapse: Secondary | ICD-10-CM

## 2020-04-15 DIAGNOSIS — R55 Syncope and collapse: Secondary | ICD-10-CM | POA: Diagnosis not present

## 2020-04-15 DIAGNOSIS — I6523 Occlusion and stenosis of bilateral carotid arteries: Secondary | ICD-10-CM | POA: Diagnosis not present

## 2020-04-24 DIAGNOSIS — G4733 Obstructive sleep apnea (adult) (pediatric): Secondary | ICD-10-CM | POA: Diagnosis not present

## 2020-04-26 ENCOUNTER — Other Ambulatory Visit: Payer: Self-pay | Admitting: Internal Medicine

## 2020-04-26 ENCOUNTER — Other Ambulatory Visit: Payer: Self-pay

## 2020-04-26 ENCOUNTER — Ambulatory Visit: Payer: PPO | Admitting: Dermatology

## 2020-04-26 DIAGNOSIS — L570 Actinic keratosis: Secondary | ICD-10-CM | POA: Diagnosis not present

## 2020-04-26 DIAGNOSIS — L578 Other skin changes due to chronic exposure to nonionizing radiation: Secondary | ICD-10-CM | POA: Diagnosis not present

## 2020-04-26 NOTE — Progress Notes (Signed)
   Follow-Up Visit   Subjective  Danny Maldonado is a 64 y.o. male who presents for the following: Follow-up (AK follow up - right mid helix, right temple, right lower lip. One on right ear has a little bit left. All 3 areas treated with LN2).  The following portions of the chart were reviewed this encounter and updated as appropriate:  Tobacco  Allergies  Meds  Problems  Med Hx  Surg Hx  Fam Hx     Review of Systems:  No other skin or systemic complaints except as noted in HPI or Assessment and Plan.  Objective  Well appearing patient in no apparent distress; mood and affect are within normal limits.  A focused examination was performed including face, scalp ears arms. Relevant physical exam findings are noted in the Assessment and Plan.  Objective  Right mid helix, right lower lip (2): Erythematous thin papules/macules with gritty scale.    Assessment & Plan    Actinic Damage - diffuse scaly erythematous macules with underlying dyspigmentation - Recommend daily broad spectrum sunscreen SPF 30+ to sun-exposed areas, reapply every 2 hours as needed.  - Call for new or changing lesions.   AK (actinic keratosis) (2) Right mid helix, right lower lip  Recheck on follow up. May consider on follow up if persistent.  Destruction of lesion - Right mid helix, right lower lip Complexity: simple   Destruction method: cryotherapy   Informed consent: discussed and consent obtained   Timeout:  patient name, date of birth, surgical site, and procedure verified Lesion destroyed using liquid nitrogen: Yes   Region frozen until ice ball extended beyond lesion: Yes   Outcome: patient tolerated procedure well with no complications   Post-procedure details: wound care instructions given    Return in about 3 months (around 07/27/2020).  I, Ashok Cordia, CMA, am acting as scribe for Sarina Ser, MD .  Documentation: I have reviewed the above documentation for accuracy and  completeness, and I agree with the above.  Sarina Ser, MD

## 2020-04-26 NOTE — Patient Instructions (Signed)

## 2020-04-29 ENCOUNTER — Other Ambulatory Visit: Payer: Self-pay

## 2020-04-29 ENCOUNTER — Ambulatory Visit
Admission: RE | Admit: 2020-04-29 | Discharge: 2020-04-29 | Disposition: A | Discharge status: Home / Self Care | Payer: PPO | Source: Ambulatory Visit | Attending: Internal Medicine | Admitting: Internal Medicine

## 2020-04-29 ENCOUNTER — Ambulatory Visit: Payer: PPO

## 2020-04-29 DIAGNOSIS — R55 Syncope and collapse: Secondary | ICD-10-CM | POA: Diagnosis not present

## 2020-04-29 DIAGNOSIS — I7090 Unspecified atherosclerosis: Secondary | ICD-10-CM | POA: Diagnosis not present

## 2020-04-29 DIAGNOSIS — R42 Dizziness and giddiness: Secondary | ICD-10-CM | POA: Diagnosis not present

## 2020-04-30 ENCOUNTER — Encounter: Payer: Self-pay | Admitting: Dermatology

## 2020-05-10 ENCOUNTER — Other Ambulatory Visit: Payer: Self-pay | Admitting: Internal Medicine

## 2020-05-11 DIAGNOSIS — H5203 Hypermetropia, bilateral: Secondary | ICD-10-CM | POA: Diagnosis not present

## 2020-05-11 DIAGNOSIS — H35033 Hypertensive retinopathy, bilateral: Secondary | ICD-10-CM | POA: Diagnosis not present

## 2020-05-11 DIAGNOSIS — Z7984 Long term (current) use of oral hypoglycemic drugs: Secondary | ICD-10-CM | POA: Diagnosis not present

## 2020-05-11 DIAGNOSIS — H52223 Regular astigmatism, bilateral: Secondary | ICD-10-CM | POA: Diagnosis not present

## 2020-05-11 DIAGNOSIS — H524 Presbyopia: Secondary | ICD-10-CM | POA: Diagnosis not present

## 2020-05-11 DIAGNOSIS — E119 Type 2 diabetes mellitus without complications: Secondary | ICD-10-CM | POA: Diagnosis not present

## 2020-05-11 DIAGNOSIS — H2513 Age-related nuclear cataract, bilateral: Secondary | ICD-10-CM | POA: Diagnosis not present

## 2020-05-25 DIAGNOSIS — G4733 Obstructive sleep apnea (adult) (pediatric): Secondary | ICD-10-CM | POA: Diagnosis not present

## 2020-05-25 DIAGNOSIS — Z23 Encounter for immunization: Secondary | ICD-10-CM | POA: Diagnosis not present

## 2020-05-25 DIAGNOSIS — E7800 Pure hypercholesterolemia, unspecified: Secondary | ICD-10-CM | POA: Diagnosis not present

## 2020-05-25 DIAGNOSIS — I10 Essential (primary) hypertension: Secondary | ICD-10-CM | POA: Diagnosis not present

## 2020-05-25 DIAGNOSIS — I700 Atherosclerosis of aorta: Secondary | ICD-10-CM | POA: Diagnosis not present

## 2020-05-25 DIAGNOSIS — E118 Type 2 diabetes mellitus with unspecified complications: Secondary | ICD-10-CM | POA: Diagnosis not present

## 2020-05-25 DIAGNOSIS — I2510 Atherosclerotic heart disease of native coronary artery without angina pectoris: Secondary | ICD-10-CM | POA: Diagnosis not present

## 2020-05-25 DIAGNOSIS — Z79899 Other long term (current) drug therapy: Secondary | ICD-10-CM | POA: Diagnosis not present

## 2020-05-27 ENCOUNTER — Other Ambulatory Visit: Payer: Self-pay | Admitting: Internal Medicine

## 2020-06-15 ENCOUNTER — Other Ambulatory Visit: Payer: Self-pay | Admitting: Internal Medicine

## 2020-06-24 DIAGNOSIS — Z9889 Other specified postprocedural states: Secondary | ICD-10-CM | POA: Diagnosis not present

## 2020-06-24 DIAGNOSIS — I10 Essential (primary) hypertension: Secondary | ICD-10-CM | POA: Diagnosis not present

## 2020-06-24 DIAGNOSIS — R600 Localized edema: Secondary | ICD-10-CM | POA: Diagnosis not present

## 2020-06-24 DIAGNOSIS — I2510 Atherosclerotic heart disease of native coronary artery without angina pectoris: Secondary | ICD-10-CM | POA: Diagnosis not present

## 2020-06-24 DIAGNOSIS — G4733 Obstructive sleep apnea (adult) (pediatric): Secondary | ICD-10-CM | POA: Diagnosis not present

## 2020-06-24 DIAGNOSIS — E7800 Pure hypercholesterolemia, unspecified: Secondary | ICD-10-CM | POA: Diagnosis not present

## 2020-06-24 DIAGNOSIS — I700 Atherosclerosis of aorta: Secondary | ICD-10-CM | POA: Diagnosis not present

## 2020-06-28 ENCOUNTER — Other Ambulatory Visit: Payer: Self-pay | Admitting: Internal Medicine

## 2020-07-06 DIAGNOSIS — R059 Cough, unspecified: Secondary | ICD-10-CM | POA: Diagnosis not present

## 2020-07-06 DIAGNOSIS — Z01818 Encounter for other preprocedural examination: Secondary | ICD-10-CM | POA: Diagnosis not present

## 2020-07-19 DIAGNOSIS — E7800 Pure hypercholesterolemia, unspecified: Secondary | ICD-10-CM | POA: Diagnosis not present

## 2020-07-19 DIAGNOSIS — Z79899 Other long term (current) drug therapy: Secondary | ICD-10-CM | POA: Diagnosis not present

## 2020-07-19 DIAGNOSIS — I10 Essential (primary) hypertension: Secondary | ICD-10-CM | POA: Diagnosis not present

## 2020-07-19 DIAGNOSIS — E118 Type 2 diabetes mellitus with unspecified complications: Secondary | ICD-10-CM | POA: Diagnosis not present

## 2020-07-25 DIAGNOSIS — G4733 Obstructive sleep apnea (adult) (pediatric): Secondary | ICD-10-CM | POA: Diagnosis not present

## 2020-07-27 ENCOUNTER — Other Ambulatory Visit: Payer: Self-pay

## 2020-07-27 ENCOUNTER — Ambulatory Visit: Payer: PPO | Admitting: Dermatology

## 2020-07-27 DIAGNOSIS — L578 Other skin changes due to chronic exposure to nonionizing radiation: Secondary | ICD-10-CM

## 2020-07-27 DIAGNOSIS — H61001 Unspecified perichondritis of right external ear: Secondary | ICD-10-CM | POA: Diagnosis not present

## 2020-07-27 DIAGNOSIS — Z872 Personal history of diseases of the skin and subcutaneous tissue: Secondary | ICD-10-CM | POA: Diagnosis not present

## 2020-07-27 DIAGNOSIS — D485 Neoplasm of uncertain behavior of skin: Secondary | ICD-10-CM

## 2020-07-27 NOTE — Progress Notes (Signed)
   Follow-Up Visit   Subjective  Danny Maldonado is a 64 y.o. male who presents for the following: Actinic Keratosis (recheck previously treated AK's on the R mid helix and R lower lip ).  The following portions of the chart were reviewed this encounter and updated as appropriate:  Tobacco  Allergies  Meds  Problems  Med Hx  Surg Hx  Fam Hx     Review of Systems:  No other skin or systemic complaints except as noted in HPI or Assessment and Plan.  Objective  Well appearing patient in no apparent distress; mood and affect are within normal limits.  A focused examination was performed including the face and ears. Relevant physical exam findings are noted in the Assessment and Plan.  Objective  R ear mid helix: 0.6 cm pink patch   Objective  R lower lip: Clear   Assessment & Plan  Neoplasm of uncertain behavior of skin R ear mid helix  Skin / nail biopsy Type of biopsy: tangential   Informed consent: discussed and consent obtained   Timeout: patient name, date of birth, surgical site, and procedure verified   Procedure prep:  Patient was prepped and draped in usual sterile fashion Prep type:  Isopropyl alcohol Anesthesia: the lesion was anesthetized in a standard fashion   Anesthetic:  1% lidocaine w/ epinephrine 1-100,000 buffered w/ 8.4% NaHCO3 Instrument used: flexible razor blade   Hemostasis achieved with: pressure, aluminum chloride and electrodesiccation   Outcome: patient tolerated procedure well   Post-procedure details: sterile dressing applied and wound care instructions given   Dressing type: bandage and petrolatum    Specimen 1 - Surgical pathology Differential Diagnosis: D48.5 r/o AK vs CNCH vs other Check Margins: No 0.6 cm pink patch  History of actinic keratosis R lower lip  Clear. Observe for recurrence. Call clinic for new or changing lesions.  Recommend regular skin exams, daily broad-spectrum spf 30+ sunscreen use, and photoprotection.       Actinic Damage - chronic, secondary to cumulative UV radiation exposure/sun exposure over time - diffuse scaly erythematous macules with underlying dyspigmentation - Recommend daily broad spectrum sunscreen SPF 30+ to sun-exposed areas, reapply every 2 hours as needed.  - Call for new or changing lesions.  Return in about 4 months (around 11/24/2020).  Luther Redo, CMA, am acting as scribe for Sarina Ser, MD .  Documentation: I have reviewed the above documentation for accuracy and completeness, and I agree with the above.  Sarina Ser, MD

## 2020-07-27 NOTE — Patient Instructions (Signed)

## 2020-07-29 ENCOUNTER — Encounter: Payer: Self-pay | Admitting: Dermatology

## 2020-08-02 ENCOUNTER — Telehealth: Payer: Self-pay

## 2020-08-02 ENCOUNTER — Other Ambulatory Visit: Payer: Self-pay | Admitting: Internal Medicine

## 2020-08-02 NOTE — Telephone Encounter (Signed)
Informed pt of results. He had no concerns. Pt chose to have rechecked at next visit.

## 2020-08-02 NOTE — Telephone Encounter (Signed)
Left message on voicemail to return my call.  

## 2020-08-02 NOTE — Telephone Encounter (Signed)
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 If bothersome, make appt to consider other treatments If not bothersome, will re-check next visit

## 2020-08-24 DIAGNOSIS — G4733 Obstructive sleep apnea (adult) (pediatric): Secondary | ICD-10-CM | POA: Diagnosis not present

## 2020-09-18 ENCOUNTER — Other Ambulatory Visit: Payer: Self-pay | Admitting: Internal Medicine

## 2020-09-19 ENCOUNTER — Other Ambulatory Visit: Payer: Self-pay | Admitting: Physical Medicine and Rehabilitation

## 2020-09-24 DIAGNOSIS — G4733 Obstructive sleep apnea (adult) (pediatric): Secondary | ICD-10-CM | POA: Diagnosis not present

## 2020-10-25 DIAGNOSIS — G4733 Obstructive sleep apnea (adult) (pediatric): Secondary | ICD-10-CM | POA: Diagnosis not present

## 2020-11-08 ENCOUNTER — Other Ambulatory Visit: Payer: Self-pay | Admitting: Internal Medicine

## 2020-11-17 DIAGNOSIS — G4733 Obstructive sleep apnea (adult) (pediatric): Secondary | ICD-10-CM | POA: Diagnosis not present

## 2020-11-22 ENCOUNTER — Other Ambulatory Visit: Payer: Self-pay | Admitting: Internal Medicine

## 2020-11-22 DIAGNOSIS — E118 Type 2 diabetes mellitus with unspecified complications: Secondary | ICD-10-CM | POA: Diagnosis not present

## 2020-11-22 DIAGNOSIS — Z125 Encounter for screening for malignant neoplasm of prostate: Secondary | ICD-10-CM | POA: Diagnosis not present

## 2020-11-22 DIAGNOSIS — E7800 Pure hypercholesterolemia, unspecified: Secondary | ICD-10-CM | POA: Diagnosis not present

## 2020-11-22 DIAGNOSIS — G4733 Obstructive sleep apnea (adult) (pediatric): Secondary | ICD-10-CM | POA: Diagnosis not present

## 2020-11-22 DIAGNOSIS — Z9889 Other specified postprocedural states: Secondary | ICD-10-CM | POA: Diagnosis not present

## 2020-11-22 DIAGNOSIS — I10 Essential (primary) hypertension: Secondary | ICD-10-CM | POA: Diagnosis not present

## 2020-11-22 DIAGNOSIS — Z79899 Other long term (current) drug therapy: Secondary | ICD-10-CM | POA: Diagnosis not present

## 2020-11-25 ENCOUNTER — Encounter: Payer: Self-pay | Admitting: Dermatology

## 2020-11-25 ENCOUNTER — Other Ambulatory Visit: Payer: Self-pay

## 2020-11-25 ENCOUNTER — Ambulatory Visit: Payer: PPO | Admitting: Dermatology

## 2020-11-25 DIAGNOSIS — L814 Other melanin hyperpigmentation: Secondary | ICD-10-CM

## 2020-11-25 DIAGNOSIS — L578 Other skin changes due to chronic exposure to nonionizing radiation: Secondary | ICD-10-CM

## 2020-11-25 DIAGNOSIS — L821 Other seborrheic keratosis: Secondary | ICD-10-CM | POA: Diagnosis not present

## 2020-11-25 DIAGNOSIS — Z1283 Encounter for screening for malignant neoplasm of skin: Secondary | ICD-10-CM | POA: Diagnosis not present

## 2020-11-25 DIAGNOSIS — D229 Melanocytic nevi, unspecified: Secondary | ICD-10-CM

## 2020-11-25 DIAGNOSIS — D1800 Hemangioma unspecified site: Secondary | ICD-10-CM

## 2020-11-25 DIAGNOSIS — H61001 Unspecified perichondritis of right external ear: Secondary | ICD-10-CM | POA: Diagnosis not present

## 2020-11-25 NOTE — Progress Notes (Signed)
   Follow-Up Visit   Subjective  Danny Maldonado is a 65 y.o. male who presents for the following: Annual Exam (Mole check, hx of SCC R temple 2011 ). The patient presents for Total-Body Skin Exam (TBSE) for skin cancer screening and mole check.  The following portions of the chart were reviewed this encounter and updated as appropriate:   Tobacco  Allergies  Meds  Problems  Med Hx  Surg Hx  Fam Hx     Review of Systems:  No other skin or systemic complaints except as noted in HPI or Assessment and Plan.  Objective  Well appearing patient in no apparent distress; mood and affect are within normal limits.  A full examination was performed including scalp, head, eyes, ears, nose, lips, neck, chest, axillae, abdomen, back, buttocks, bilateral upper extremities, bilateral lower extremities, hands, feet, fingers, toes, fingernails, and toenails. All findings within normal limits unless otherwise noted below.  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 Observe for recurrence. Call clinic for new or changing lesions.  Recommend regular skin exams, daily broad-spectrum spf 30+ sunscreen use, and photoprotection.     History of Squamous Cell Carcinoma of the Skin - R temple 2011 - No evidence of recurrence today - No lymphadenopathy - Recommend regular full body skin exams - Recommend daily broad spectrum sunscreen SPF 30+ to sun-exposed areas, reapply every 2 hours as needed.  - Call if any new or changing lesions are noted between office visits  Skin cancer screening   Lentigines - Scattered tan macules - Due to sun exposure - Benign-appering, observe - Recommend daily broad spectrum sunscreen SPF 30+ to sun-exposed areas, reapply every 2 hours as needed. - Call for any changes  Seborrheic Keratoses - Stuck-on, waxy,  tan-brown papules and/or plaques  - Benign-appearing - Discussed benign etiology and prognosis. - Observe - Call for any changes  Melanocytic Nevi - Tan-brown and/or pink-flesh-colored symmetric macules and papules - Benign appearing on exam today - Observation - Call clinic for new or changing moles - Recommend daily use of broad spectrum spf 30+ sunscreen to sun-exposed areas.   Hemangiomas - Red papules - Discussed benign nature - Observe - Call for any changes  Actinic Damage - Chronic condition, secondary to cumulative UV/sun exposure - diffuse scaly erythematous macules with underlying dyspigmentation - Recommend daily broad spectrum sunscreen SPF 30+ to sun-exposed areas, reapply every 2 hours as needed.  - Staying in the shade or wearing long sleeves, sun glasses (UVA+UVB protection) and wide brim hats (4-inch brim around the entire circumference of the hat) are also recommended for sun protection.  - Call for new or changing lesions.  Skin cancer screening performed today.  Return in about 1 year (around 11/25/2021) for TSBE, Hx of SCC .  IMarye Round, CMA, am acting as scribe for Sarina Ser, MD .  Documentation: I have reviewed the above documentation for accuracy and completeness, and I agree with the above.  Sarina Ser, MD

## 2020-11-25 NOTE — Patient Instructions (Addendum)

## 2020-11-26 ENCOUNTER — Encounter: Payer: Self-pay | Admitting: Dermatology

## 2020-12-06 ENCOUNTER — Other Ambulatory Visit: Payer: Self-pay

## 2020-12-06 MED FILL — Budesonide Inhalation Susp 0.5 MG/2ML: RESPIRATORY_TRACT | 30 days supply | Qty: 120 | Fill #0 | Status: AC

## 2020-12-13 ENCOUNTER — Other Ambulatory Visit: Payer: Self-pay

## 2020-12-13 MED FILL — Pantoprazole Sodium EC Tab 40 MG (Base Equiv): ORAL | 90 days supply | Qty: 90 | Fill #0 | Status: AC

## 2020-12-14 DIAGNOSIS — J470 Bronchiectasis with acute lower respiratory infection: Secondary | ICD-10-CM | POA: Diagnosis not present

## 2020-12-23 ENCOUNTER — Other Ambulatory Visit: Payer: Self-pay | Admitting: Pulmonary Disease

## 2020-12-23 ENCOUNTER — Other Ambulatory Visit: Payer: Self-pay

## 2020-12-23 DIAGNOSIS — G4733 Obstructive sleep apnea (adult) (pediatric): Secondary | ICD-10-CM | POA: Diagnosis not present

## 2020-12-23 MED ORDER — MONTELUKAST SODIUM 10 MG PO TABS
ORAL_TABLET | Freq: Every day | ORAL | 11 refills | Status: DC
  Filled 2020-12-23: qty 30, 30d supply, fill #0
  Filled 2021-01-28: qty 30, 30d supply, fill #1
  Filled 2021-02-28: qty 30, 30d supply, fill #2
  Filled 2021-03-28: qty 30, 30d supply, fill #3
  Filled 2021-04-28: qty 30, 30d supply, fill #4
  Filled 2021-05-28 – 2021-05-30 (×2): qty 30, 30d supply, fill #5
  Filled 2021-06-29: qty 30, 30d supply, fill #6
  Filled 2021-07-27: qty 30, 30d supply, fill #7
  Filled 2021-08-25: qty 30, 30d supply, fill #8
  Filled 2021-09-26: qty 30, 30d supply, fill #9
  Filled 2021-10-26: qty 30, 30d supply, fill #10
  Filled 2021-11-23: qty 30, 30d supply, fill #11

## 2020-12-31 ENCOUNTER — Other Ambulatory Visit: Payer: Self-pay

## 2020-12-31 MED ORDER — CEFDINIR 300 MG PO CAPS
ORAL_CAPSULE | ORAL | 0 refills | Status: DC
  Filled 2020-12-31: qty 20, 10d supply, fill #0

## 2021-01-02 ENCOUNTER — Other Ambulatory Visit: Payer: Self-pay | Admitting: Internal Medicine

## 2021-01-02 MED ORDER — FREESTYLE LITE TEST VI STRP
ORAL_STRIP | 0 refills | Status: AC
  Filled 2021-01-02: qty 100, 50d supply, fill #0

## 2021-01-02 MED FILL — Lancets: 50 days supply | Qty: 100 | Fill #0 | Status: AC

## 2021-01-03 ENCOUNTER — Other Ambulatory Visit: Payer: Self-pay

## 2021-01-03 DIAGNOSIS — H8309 Labyrinthitis, unspecified ear: Secondary | ICD-10-CM | POA: Diagnosis not present

## 2021-01-03 DIAGNOSIS — B9689 Other specified bacterial agents as the cause of diseases classified elsewhere: Secondary | ICD-10-CM | POA: Diagnosis not present

## 2021-01-03 DIAGNOSIS — J019 Acute sinusitis, unspecified: Secondary | ICD-10-CM | POA: Diagnosis not present

## 2021-01-03 DIAGNOSIS — H66002 Acute suppurative otitis media without spontaneous rupture of ear drum, left ear: Secondary | ICD-10-CM | POA: Diagnosis not present

## 2021-01-03 DIAGNOSIS — Z209 Contact with and (suspected) exposure to unspecified communicable disease: Secondary | ICD-10-CM | POA: Diagnosis not present

## 2021-01-03 DIAGNOSIS — Z03818 Encounter for observation for suspected exposure to other biological agents ruled out: Secondary | ICD-10-CM | POA: Diagnosis not present

## 2021-01-03 DIAGNOSIS — I499 Cardiac arrhythmia, unspecified: Secondary | ICD-10-CM | POA: Diagnosis not present

## 2021-01-03 DIAGNOSIS — H6012 Cellulitis of left external ear: Secondary | ICD-10-CM | POA: Diagnosis not present

## 2021-01-03 DIAGNOSIS — R519 Headache, unspecified: Secondary | ICD-10-CM | POA: Diagnosis not present

## 2021-01-03 DIAGNOSIS — H9202 Otalgia, left ear: Secondary | ICD-10-CM | POA: Diagnosis not present

## 2021-01-03 DIAGNOSIS — R42 Dizziness and giddiness: Secondary | ICD-10-CM | POA: Diagnosis not present

## 2021-01-03 MED ORDER — PREDNISONE 20 MG PO TABS
ORAL_TABLET | ORAL | 0 refills | Status: DC
  Filled 2021-01-03: qty 10, 5d supply, fill #0

## 2021-01-03 MED ORDER — MECLIZINE HCL 25 MG PO TABS
ORAL_TABLET | ORAL | 0 refills | Status: DC
  Filled 2021-01-03: qty 10, 3d supply, fill #0

## 2021-01-03 MED ORDER — DOXYCYCLINE HYCLATE 100 MG PO CAPS
ORAL_CAPSULE | ORAL | 0 refills | Status: DC
  Filled 2021-01-03: qty 20, 10d supply, fill #0

## 2021-01-05 ENCOUNTER — Other Ambulatory Visit: Payer: Self-pay

## 2021-01-05 ENCOUNTER — Other Ambulatory Visit: Payer: Self-pay | Admitting: Pulmonary Disease

## 2021-01-05 MED ORDER — BUDESONIDE 0.5 MG/2ML IN SUSP
RESPIRATORY_TRACT | 11 refills | Status: DC
  Filled 2021-01-05: qty 120, 30d supply, fill #0
  Filled 2021-02-06: qty 120, 30d supply, fill #1
  Filled 2021-03-08: qty 120, 30d supply, fill #2
  Filled 2021-04-13: qty 120, 30d supply, fill #3
  Filled 2021-05-12: qty 120, 30d supply, fill #4
  Filled 2021-06-12: qty 120, 30d supply, fill #5
  Filled 2021-07-10: qty 120, 30d supply, fill #6
  Filled 2021-08-15: qty 120, 30d supply, fill #7
  Filled 2021-09-08: qty 120, 30d supply, fill #8
  Filled 2021-10-12: qty 120, 30d supply, fill #9
  Filled 2021-11-11: qty 120, 30d supply, fill #10
  Filled 2021-12-10: qty 120, 30d supply, fill #11

## 2021-01-05 MED FILL — Gabapentin Cap 300 MG: ORAL | 30 days supply | Qty: 60 | Fill #0 | Status: AC

## 2021-01-06 ENCOUNTER — Emergency Department: Payer: PPO

## 2021-01-06 ENCOUNTER — Emergency Department
Admission: EM | Admit: 2021-01-06 | Discharge: 2021-01-06 | Disposition: A | Discharge status: Home / Self Care | Payer: PPO | Attending: Emergency Medicine | Admitting: Emergency Medicine

## 2021-01-06 ENCOUNTER — Other Ambulatory Visit: Payer: Self-pay

## 2021-01-06 DIAGNOSIS — G510 Bell's palsy: Secondary | ICD-10-CM

## 2021-01-06 DIAGNOSIS — Z859 Personal history of malignant neoplasm, unspecified: Secondary | ICD-10-CM | POA: Diagnosis not present

## 2021-01-06 DIAGNOSIS — Z7982 Long term (current) use of aspirin: Secondary | ICD-10-CM | POA: Insufficient documentation

## 2021-01-06 DIAGNOSIS — I10 Essential (primary) hypertension: Secondary | ICD-10-CM | POA: Insufficient documentation

## 2021-01-06 DIAGNOSIS — Z7984 Long term (current) use of oral hypoglycemic drugs: Secondary | ICD-10-CM | POA: Insufficient documentation

## 2021-01-06 DIAGNOSIS — R29810 Facial weakness: Secondary | ICD-10-CM | POA: Insufficient documentation

## 2021-01-06 DIAGNOSIS — E119 Type 2 diabetes mellitus without complications: Secondary | ICD-10-CM | POA: Diagnosis not present

## 2021-01-06 DIAGNOSIS — Z87891 Personal history of nicotine dependence: Secondary | ICD-10-CM | POA: Insufficient documentation

## 2021-01-06 DIAGNOSIS — I6523 Occlusion and stenosis of bilateral carotid arteries: Secondary | ICD-10-CM | POA: Diagnosis not present

## 2021-01-06 DIAGNOSIS — Z79899 Other long term (current) drug therapy: Secondary | ICD-10-CM | POA: Diagnosis not present

## 2021-01-06 LAB — CBC WITH DIFFERENTIAL/PLATELET
Abs Immature Granulocytes: 0.07 10*3/uL (ref 0.00–0.07)
Basophils Absolute: 0 10*3/uL (ref 0.0–0.1)
Basophils Relative: 0 %
Eosinophils Absolute: 0 10*3/uL (ref 0.0–0.5)
Eosinophils Relative: 0 %
HCT: 46.4 % (ref 39.0–52.0)
Hemoglobin: 15.1 g/dL (ref 13.0–17.0)
Immature Granulocytes: 1 %
Lymphocytes Relative: 11 %
Lymphs Abs: 1.3 10*3/uL (ref 0.7–4.0)
MCH: 27 pg (ref 26.0–34.0)
MCHC: 32.5 g/dL (ref 30.0–36.0)
MCV: 82.9 fL (ref 80.0–100.0)
Monocytes Absolute: 0.8 10*3/uL (ref 0.1–1.0)
Monocytes Relative: 7 %
Neutro Abs: 9.5 10*3/uL — ABNORMAL HIGH (ref 1.7–7.7)
Neutrophils Relative %: 81 %
Platelets: 278 10*3/uL (ref 150–400)
RBC: 5.6 MIL/uL (ref 4.22–5.81)
RDW: 15.2 % (ref 11.5–15.5)
WBC: 11.7 10*3/uL — ABNORMAL HIGH (ref 4.0–10.5)
nRBC: 0 % (ref 0.0–0.2)

## 2021-01-06 LAB — COMPREHENSIVE METABOLIC PANEL
ALT: 19 U/L (ref 0–44)
AST: 21 U/L (ref 15–41)
Albumin: 4.2 g/dL (ref 3.5–5.0)
Alkaline Phosphatase: 48 U/L (ref 38–126)
Anion gap: 12 (ref 5–15)
BUN: 28 mg/dL — ABNORMAL HIGH (ref 8–23)
CO2: 21 mmol/L — ABNORMAL LOW (ref 22–32)
Calcium: 9.6 mg/dL (ref 8.9–10.3)
Chloride: 104 mmol/L (ref 98–111)
Creatinine, Ser: 1.09 mg/dL (ref 0.61–1.24)
GFR, Estimated: >60 mL/min (ref >60)
Glucose, Bld: 150 mg/dL — ABNORMAL HIGH (ref 70–99)
Potassium: 4.4 mmol/L (ref 3.5–5.1)
Sodium: 137 mmol/L (ref 135–145)
Total Bilirubin: 0.9 mg/dL (ref 0.3–1.2)
Total Protein: 7.6 g/dL (ref 6.5–8.1)

## 2021-01-06 LAB — PROTIME-INR
INR: 1.1 (ref 0.8–1.2)
Prothrombin Time: 13.7 seconds (ref 11.4–15.2)

## 2021-01-06 LAB — APTT: aPTT: 27 seconds (ref 24–36)

## 2021-01-06 LAB — CBG MONITORING, ED: Glucose-Capillary: 140 mg/dL — ABNORMAL HIGH (ref 70–99)

## 2021-01-06 MED ORDER — GADOBUTROL 1 MMOL/ML IV SOLN
10.0000 mL | Freq: Once | INTRAVENOUS | Status: AC | PRN
  Administered 2021-01-06: 10 mL via INTRAVENOUS

## 2021-01-06 MED ORDER — ACYCLOVIR 400 MG PO TABS: 400.0000 mg | ORAL_TABLET | Freq: Every day | ORAL | 0 refills | Status: AC

## 2021-01-06 MED ORDER — ERYTHROMYCIN 5 MG/GM OP OINT: 1.0000 "application " | TOPICAL_OINTMENT | Freq: Every day | OPHTHALMIC | 0 refills | Status: DC

## 2021-01-06 MED ORDER — PREDNISONE 20 MG PO TABS: 60.0000 mg | ORAL_TABLET | Freq: Every day | ORAL | 0 refills | Status: AC

## 2021-01-06 MED ORDER — ACETAMINOPHEN 500 MG PO TABS
1000.0000 mg | ORAL_TABLET | Freq: Once | ORAL | Status: AC
  Administered 2021-01-06: 1000 mg via ORAL
  Filled 2021-01-06: qty 2

## 2021-01-06 NOTE — ED Notes (Signed)
ED Provider at bedside. 

## 2021-01-06 NOTE — ED Provider Notes (Signed)
-----------------------------------------   3:05 PM on 01/06/2021 -----------------------------------------  Blood pressure 123/72, pulse 61, temperature 98 F (36.7 C), temperature source Oral, resp. rate (!) 21, height 5\' 8"  (1.727 m), weight (!) 140 kg, SpO2 95 %.  Assuming care from Dr. Tamala Julian.  In short, Danny Maldonado is a 65 y.o. male with a chief complaint of Facial Droop (Facial droop ; Onset Monday) .  Refer to the original H&P for additional details.  The current plan of care is to follow-up MRI results and plan for admission for stoke, anticipate admission.  ----------------------------------------- 5:42 PM on 01/06/2021 -----------------------------------------  MRI is negative for stroke, does show enhancement of the 7th cranial nerve on the left consistent with a Bell's palsy.  MRA does show vascular stenosis but no occlusive disease.  Given no evidence of stroke, patient is appropriate for discharge home with outpatient neurology follow-up.  Will prescribe acyclovir, prednisone, and erythromycin ointment for his eye.  He was counseled to return to the ED for any new or worsening symptoms, patient agrees with plan.    Blake Divine, MD 01/06/21 1744

## 2021-01-06 NOTE — ED Provider Notes (Signed)
[facility] Emergency Department Provider Note ____________________________________________   Event Date/Time   First MD Initiated Contact with Patient 01/06/21 [phone]     (approximate)  I have reviewed the triage vital signs and the nursing notes.  HISTORY  Chief Complaint Facial Droop (Facial droop ; Onset Monday)   HPI Danny Maldonado is a 65 y.o. malewho presents to the ED for evaluation of left sided facial droop.   Chart review indicates on 5/9 patient was seen at the neighboring walk-in clinic and diagnosed with acute suppurative AOM on the left and cellulitis of the left auricle, for which he received prednisone burst, doxycycline.  Patient reports that evening, on 5/9, he noticed a left side of his face drooping and his left eye watering. He reports the symptoms have been present since that time.  Reports seeing his daughter last night, and she is an ICU nurse, who advised him to come to the ED for evaluation.  Patient reports walking through [employer] yesterday and felt like he was drifting to the side while pushing his cart.  Reports a mild 3/10 left-sided headache.  But denies any additional neurologic symptoms.    Past Medical History:  Diagnosis Date  . Actinic keratosis   . Allergy   . Cancer ([place])   . Diabetes mellitus without complication ([place])   . DJD (degenerative joint disease)   . Gastritis   . GERD (gastroesophageal reflux disease)   . Hyperlipidemia   . Sleep apnea   . Squamous cell carcinoma of skin 06/08/2010   R temple    Patient Active Problem List   Diagnosis Date Noted  . Diabetes mellitus type 2, uncomplicated ([place]) 17/61/6073  . DJD (degenerative joint disease) 06/27/2016  . Hyperlipidemia, unspecified 06/27/2016  . Hypertension 06/27/2016  . Sleep apnea 06/27/2016  . Syncope and collapse 05/10/2016    Past Surgical History:  Procedure Laterality Date  . CARDIAC CATHETERIZATION    . CHOLECYSTECTOMY    .  COLONOSCOPY WITH PROPOFOL N/A 09/26/2017   Procedure: COLONOSCOPY WITH PROPOFOL;  Surgeon: Manya Silvas, MD;  Location: [facility] ENDOSCOPY;  Service: Endoscopy;  Laterality: N/A;  . HERNIA REPAIR    . right trigger thumb release    . VASECTOMY      Prior to Admission medications   Medication Sig Start Date End Date Taking? Authorizing Provider  acetaminophen (TYLENOL) 500 MG tablet Take 1,000 mg by mouth every 6 (six) hours as needed (PRN - takes approximately once per week). Patient not taking: Reported on 07/27/2020    [provider]  aspirin EC 81 MG tablet Take 81 mg by mouth daily. Patient not taking: Reported on 07/27/2020    [provider]  atorvastatin (LIPITOR) 10 MG tablet Take 10 mg by mouth daily.    [provider]  atorvastatin (LIPITOR) 10 MG tablet TAKE 1 TABLET BY MOUTH ONCE DAILY 05/27/20 05/27/21  Corey Skains, MD  bisoprolol-hydrochlorothiazide [facility]) 5-6.25 MG tablet Take 1 tablet by mouth daily. 04/26/20 04/26/21  [provider]  bisoprolol-hydrochlorothiazide (ZIAC) 5-6.25 MG tablet TAKE 1 TABLET BY MOUTH ONCE DAILY 04/26/20 04/26/21  Idelle Crouch, MD  budesonide (PULMICORT) 0.5 MG/2ML nebulizer solution Inhale into the lungs. 06/14/20 06/14/21  [provider]  budesonide (PULMICORT) 0.5 MG/2ML nebulizer solution USE 2 MILLILITERS (ML) BY NEBULIZATION 2 TIMES DAILY 01/05/21 01/05/22  Ottie Glazier, MD  canagliflozin (INVOKANA) 300 MG TABS tablet TAKE 1 TABLET BY MOUTH EVERY MORNING BEFORE BREAKFAST 05/10/20  [provider]  canagliflozin (INVOKANA) 300 MG TABS tablet TAKE 1 TABLET BY MOUTH EVERY MORNING BEFORE BREAKFAST 05/10/20 05/10/21  Idelle Crouch, MD  cefdinir (OMNICEF) 300 MG capsule Take 1 capsule (300 mg total) by mouth every 12 (twelve) hours for 10 days 12/31/20     cetirizine (ZYRTEC) 10 MG tablet Take 10 mg by mouth every evening.  Patient not taking: Reported on 07/27/2020    [provider]  Cyanocobalamin 500 MCG SUBL Place 1,000 mcg under the tongue daily.    [provider]  diclofenac sodium (VOLTAREN) 1 % GEL Apply topically 4 (four) times daily. Patient not taking: Reported on 07/27/2020    [provider]  doxycycline (VIBRAMYCIN) 100 MG capsule Take 1 capsule (100 mg total) by mouth 2 (two) times daily for 10 days 01/03/21     famotidine (PEPCID) 20 MG tablet Take 1 tablet (20 mg total) by mouth 2 (two) times daily. Patient not taking: Reported on 04/25/2018 02/28/17   Carrie Mew, MD  fluticasone [facility]) 50 MCG/ACT nasal spray Place 2 sprays into both nostrils daily. Patient not taking: Reported on 07/27/2020    [provider]  gabapentin (NEURONTIN) 300 MG capsule TAKE 1 CAPSULE BY MOUTH TWICE DAILY AS TOLERATED 10/03/19   [provider]  gabapentin (NEURONTIN) 300 MG capsule TAKE 1 CAPSULE BY MOUTH TWICE DAILY AS TOLERATED 09/19/20 09/19/21  Chasnis, Marland Kitchen, MD  glimepiride (AMARYL) 2 MG tablet Take 2 mg by mouth daily with breakfast.    [provider]  glimepiride (AMARYL) 2 MG tablet TAKE 1 TABLET BY MOUTH DAILY WITH BREAKFAST 01/05/20 01/04/21  Idelle Crouch, MD  glucose blood (FREESTYLE LITE) test strip USE AS DIRECTED 2 TIMES A DAY 01/02/21 01/02/22  Idelle Crouch, MD  glucose blood test strip USE 2 TIMES DAILY AS INSTRUCTED. 11/22/20 11/22/21  Idelle Crouch, MD  HYDROcodone-acetaminophen (NORCO) 7.5-325 MG tablet Take 1 tablet by mouth every 6 (six) hours as needed for moderate pain. Patient not taking: Reported on 07/27/2020    [provider]  ipratropium-albuterol (DUONEB) 0.5-2.5 (3) MG/3ML SOLN Take by nebulization. 07/09/20   [provider]  Lancets (FREESTYLE) lancets USE AS DIRECTED 2 TIMES A DAY 09/18/20 09/18/21  Idelle Crouch, MD  Lancets (FREESTYLE) lancets USE AS DIRECTED 2 TIMES A DAY 08/02/20 08/02/21  Idelle Crouch, MD  Lancets (FREESTYLE) lancets USE AS  DIRECTED 2 TIMES A DAY 06/15/20 06/15/21  Idelle Crouch, MD  lisinopril (PRINIVIL,ZESTRIL) 10 MG tablet Take 10 mg by mouth daily. Patient not taking: Reported on 07/27/2020    [provider]  loratadine (CLARITIN) 10 MG tablet Take 10 mg by mouth every morning.  Patient not taking: Reported on 07/27/2020    [provider]  magnesium oxide (MAG-OX) 400 MG tablet Take 400 mg by mouth daily.    [provider]  meclizine (ANTIVERT) 25 MG tablet take 1/2 to 1 tablet by mouth 3 times a day as needed for dizziness 01/03/21     meloxicam (MOBIC) 15 MG tablet Take 15 mg by mouth daily. Patient not taking: Reported on 07/27/2020    [provider]  metFORMIN (GLUCOPHAGE) 1000 MG tablet Take 1,000 mg by mouth 2 (two) times daily with a meal.    [provider]  metFORMIN (GLUCOPHAGE) 1000 MG tablet TAKE 1 TABLET BY MOUTH 2 TIMES DAILY WITH MEALS 01/05/20 01/04/21  Idelle Crouch, MD  metoCLOPramide (REGLAN) 10 MG tablet Take 1  tablet (10 mg total) by mouth every 6 (six) hours as needed. Patient not taking: Reported on 09/26/2017 02/28/17   Sharman Cheek, MD  montelukast (SINGULAIR) 10 MG tablet Take by mouth. 01/06/20 01/05/21  [provider]  montelukast (SINGULAIR) 10 MG tablet TAKE 1 TABLET BY MOUTH NIGHTLY 12/23/20 12/23/21  Vida Rigger, MD  Multiple Vitamin (MULTIVITAMIN) tablet Take 1 tablet by mouth daily.    [provider]  omeprazole (PRILOSEC) 20 MG capsule Take 20 mg by mouth daily. Patient not taking: Reported on 07/27/2020    [provider]  pantoprazole (PROTONIX) 40 MG tablet Take 40 mg by mouth daily.    [provider]  pantoprazole (PROTONIX) 40 MG tablet TAKE 1 TABLET BY MOUTH ONCE DAILY 06/28/20 06/28/21  Marguarite Arbour, MD  predniSONE (DELTASONE) 20 MG tablet Take 1 tablet (20 mg total) by mouth 2 (two) times daily for 5 days 01/03/21     sucralfate (CARAFATE) 1 g tablet Take 1 tablet (1 g total)  by mouth 4 (four) times daily. 02/28/17   Sharman Cheek, MD  tiZANidine (ZANAFLEX) 4 MG capsule Take 4 mg by mouth 3 (three) times daily. Patient not taking: Reported on 07/27/2020    [provider]  triamcinolone ointment (KENALOG) 0.5 % Apply 1 application topically 2 (two) times daily. Patient not taking: Reported on 07/27/2020 07/02/19   Junie Spencer, FNP    Allergies Patient has no known allergies.  Family History  Problem Relation Age of Onset  . Hypertension Mother   . COPD Father   . Hypertension Father   . Stroke Maternal Grandfather     Social History Social History   Tobacco Use  . Smoking status: Former Smoker    Quit date: 12/02/1985    Years since quitting: 35.1  . Smokeless tobacco: Never Used  Substance Use Topics  . Alcohol use: No    Alcohol/week: 0.0 standard drinks  . Drug use: No    Review of Systems  Constitutional: No fever/chills Eyes: No visual changes. ENT: No sore throat. Cardiovascular: Denies chest pain. Respiratory: Denies shortness of breath. Gastrointestinal: No abdominal pain.  No nausea, no vomiting.  No diarrhea.  No constipation. Genitourinary: Negative for dysuria. Musculoskeletal: Negative for back pain. Skin: Negative for rash. Neurological:   Positive for headache and facial weakness  ____________________________________________   PHYSICAL EXAM:  VITAL SIGNS: Vitals:   01/06/21 1230 01/06/21 1300  BP: 125/68 123/72  Pulse: 61 61  Resp: 17 (!) 21  Temp:    SpO2: 99% 95%     Constitutional: Alert and oriented. Well appearing and in no acute distress.  Pleasant and conversational in full sentences. Eyes: Conjunctivae are normal. PERRL. EOMI. Head: Atraumatic. Nose: No congestion/rhinnorhea. Mouth/Throat: Mucous membranes are moist.  Oropharynx non-erythematous. Neck: No stridor. No cervical spine tenderness to palpation. Cardiovascular: Normal rate, regular rhythm. Grossly normal heart sounds.  Good  peripheral circulation. Respiratory: Normal respiratory effort.  No retractions. Lungs CTAB. Gastrointestinal: Soft , nondistended, nontender to palpation. No CVA tenderness. Musculoskeletal: No lower extremity tenderness nor edema.  No joint effusions. No signs of acute trauma. Neurologic:  Normal speech and language. Central cranial nerve VII deficit is noted.  Otherwise unable to elicit any acute deficits. Sensation and strength intact to all 4 extremities. Skin:  Skin is warm, dry and intact. No rash noted. Psychiatric: Mood and affect are normal. Speech and behavior are normal.  ____________________________________________   LABS (all labs ordered are listed, but only abnormal  results are displayed)  Labs Reviewed  COMPREHENSIVE METABOLIC PANEL - Abnormal; Notable for the following components:      Result Value   CO2 21 (*)    Glucose, Bld 150 (*)    BUN 28 (*)    All other components within normal limits  CBC WITH DIFFERENTIAL/PLATELET - Abnormal; Notable for the following components:   WBC 11.7 (*)    Neutro Abs 9.5 (*)    All other components within normal limits  CBG MONITORING, ED - Abnormal; Notable for the following components:   Glucose-Capillary 140 (*)    All other components within normal limits  PROTIME-INR  APTT   ____________________________________________  12 Lead EKG  Sinus rhythm, Rate of 65 bpm.  Normal axis and intervals.  No evidence of acute ischemia. ____________________________________________  RADIOLOGY  ED MD interpretation: CT head reviewed by me without evidence of acute intracranial pathology.  Official radiology report(s): CT Head Wo Contrast  Result Date: 01/06/2021 CLINICAL DATA:  Left-sided facial droop EXAM: CT HEAD WITHOUT CONTRAST TECHNIQUE: Contiguous axial images were obtained from the base of the skull through the vertex without intravenous contrast. COMPARISON:  April 29, 2020. FINDINGS: Brain: Ventricles and sulci are  normal in size and configuration. There is no appreciable intracranial mass, hemorrhage, extra-axial fluid collection, or midline shift. Brain parenchyma appears unremarkable. No evident acute infarct. Vascular: No hyperdense vessel. There is calcification in each carotid siphon region. Skull: The bony calvarium appears intact. Sinuses/Orbits: There is mucosal thickening in several ethmoid air cells. Visualized paranasal sinuses otherwise are clear. Orbits appear symmetric bilaterally. Other: Visualized mastoid air cells are clear. IMPRESSION: Normal appearing brain parenchyma without demonstrable acute infarct. No mass or hemorrhage. There are foci of arterial vascular calcification. There is mucosal thickening in several ethmoid air cells. Electronically Signed   By: Lowella Grip III M.D.   On: 01/06/2021 10:00    ____________________________________________   PROCEDURES and INTERVENTIONS  Procedure(s) performed (including Critical Care):  .1-3 Lead EKG Interpretation Performed by: Vladimir Crofts, MD Authorized by: Vladimir Crofts, MD     Interpretation: normal     ECG rate:  66   ECG rate assessment: normal     Rhythm: sinus rhythm     Ectopy: none     Conduction: normal      Medications  acetaminophen (TYLENOL) tablet 1,000 mg (1,000 mg Oral Given 01/06/21 0922)    ____________________________________________   MDM / ED COURSE   65 year old male without history of stroke presents to the ED with 4 days of new central cranial nerve VII deficit concerning for new CVA.  Normal vitals.  Exam demonstrates left-sided central cranial nerve VII deficit is the only neurologic deficit that I can duplicate.  Otherwise looks well without distress, vascular deficits or signs of deficit to the extremities.  Blood work is benign and EKG is nonischemic.  CT head demonstrates no evidence of ICH, mass.  MRI pending at the time of signout to oncoming divider.  Anticipate new acute CVA and need for  medical admission for stroke work-up.  Patient signed out to oncoming provider to follow-up on this MRI.  Clinical Course as of 01/06/21 1447  Thu Jan 06, 2021  6644 I discussed with the patient my concern for the possibility of stroke due to his apparent central cranial nerve VII deficit.  We discussed blood work and CT imaging.  He is in agreement. [DS]  0347 [id number].  Wife now at bedside.  Patient was resolution of headache  with Tylenol.  We discussed reassuring CT results.  We discussed my persistent concern for central nervous pathology.  Reexamination of his cranial nerve is the same with a central cranial nerve VII deficit apparent.  We discussed MRI and he is agreeable. [DS]  1410 There was a mis-communication between myself and the RT from MRI regarding appropriate studies with the patient.  MRA brain was performed without MRI brain.  I discussed this accidental error with the patient and his wife, as well as the MRI department.  They are all in agreement with patient staying for MRI brain without contrast to assess for CVA [DS]  1445 Pt back in MRI [DS]    Clinical Course User Index [DS] Vladimir Crofts, MD    ____________________________________________   FINAL CLINICAL IMPRESSION(S) / ED DIAGNOSES  Final diagnoses:  Facial droop     ED Discharge Orders    None       Ruari Mudgett Tamala Julian   Note:  This document was prepared using Dragon voice recognition software and may include unintentional dictation errors.   Vladimir Crofts, MD 01/06/21 1525

## 2021-01-06 NOTE — ED Triage Notes (Signed)
Pt coming from home with left sided facial droop that started Monday ( afternoon ) . Pt stated was treated for an ear infection and started taking an antibiotic ( doxycyline Monday ) .  Pt presents with left sided facial droop. Pt has equal grip strength , speech clear, ambulatory.

## 2021-01-06 NOTE — ED Notes (Signed)
Pt speaking with MRI.

## 2021-01-06 NOTE — ED Notes (Signed)
Patient transported to MRI 

## 2021-01-17 ENCOUNTER — Other Ambulatory Visit: Payer: Self-pay

## 2021-01-17 MED ORDER — METFORMIN HCL 1000 MG PO TABS
ORAL_TABLET | ORAL | 3 refills | Status: DC
  Filled 2021-01-17: qty 180, 90d supply, fill #0
  Filled 2021-04-23: qty 180, 90d supply, fill #1
  Filled 2021-07-20: qty 180, 90d supply, fill #2
  Filled 2021-10-17: qty 180, 90d supply, fill #3

## 2021-01-17 MED FILL — Bisoprolol & Hydrochlorothiazide Tab 5-6.25 MG: ORAL | 90 days supply | Qty: 90 | Fill #0 | Status: AC

## 2021-01-21 DIAGNOSIS — H9202 Otalgia, left ear: Secondary | ICD-10-CM | POA: Diagnosis not present

## 2021-01-21 DIAGNOSIS — Z8669 Personal history of other diseases of the nervous system and sense organs: Secondary | ICD-10-CM | POA: Diagnosis not present

## 2021-01-21 DIAGNOSIS — H02401 Unspecified ptosis of right eyelid: Secondary | ICD-10-CM | POA: Diagnosis not present

## 2021-01-22 DIAGNOSIS — G4733 Obstructive sleep apnea (adult) (pediatric): Secondary | ICD-10-CM | POA: Diagnosis not present

## 2021-01-27 DIAGNOSIS — Z7984 Long term (current) use of oral hypoglycemic drugs: Secondary | ICD-10-CM | POA: Diagnosis not present

## 2021-01-27 DIAGNOSIS — H2513 Age-related nuclear cataract, bilateral: Secondary | ICD-10-CM | POA: Diagnosis not present

## 2021-01-27 DIAGNOSIS — G510 Bell's palsy: Secondary | ICD-10-CM | POA: Diagnosis not present

## 2021-01-27 DIAGNOSIS — H35033 Hypertensive retinopathy, bilateral: Secondary | ICD-10-CM | POA: Diagnosis not present

## 2021-01-27 DIAGNOSIS — E119 Type 2 diabetes mellitus without complications: Secondary | ICD-10-CM | POA: Diagnosis not present

## 2021-01-28 ENCOUNTER — Other Ambulatory Visit: Payer: Self-pay

## 2021-02-06 ENCOUNTER — Other Ambulatory Visit: Payer: Self-pay

## 2021-02-07 ENCOUNTER — Other Ambulatory Visit: Payer: Self-pay

## 2021-02-07 MED ORDER — GLIMEPIRIDE 2 MG PO TABS
ORAL_TABLET | ORAL | 3 refills | Status: DC
  Filled 2021-02-07: qty 90, 90d supply, fill #0
  Filled 2021-05-23 (×2): qty 90, 90d supply, fill #1
  Filled 2021-08-20: qty 90, 90d supply, fill #2
  Filled 2021-11-11: qty 90, 90d supply, fill #3

## 2021-02-09 ENCOUNTER — Other Ambulatory Visit: Payer: Self-pay

## 2021-02-09 DIAGNOSIS — G510 Bell's palsy: Secondary | ICD-10-CM | POA: Diagnosis not present

## 2021-02-09 DIAGNOSIS — Z7984 Long term (current) use of oral hypoglycemic drugs: Secondary | ICD-10-CM | POA: Diagnosis not present

## 2021-02-09 DIAGNOSIS — H35033 Hypertensive retinopathy, bilateral: Secondary | ICD-10-CM | POA: Diagnosis not present

## 2021-02-09 DIAGNOSIS — E119 Type 2 diabetes mellitus without complications: Secondary | ICD-10-CM | POA: Diagnosis not present

## 2021-02-09 DIAGNOSIS — H2513 Age-related nuclear cataract, bilateral: Secondary | ICD-10-CM | POA: Diagnosis not present

## 2021-02-09 MED ORDER — ERYTHROMYCIN 5 MG/GM OP OINT
TOPICAL_OINTMENT | OPHTHALMIC | 2 refills | Status: DC
  Filled 2021-02-09: qty 3.5, 10d supply, fill #0
  Filled 2021-02-23: qty 3.5, 10d supply, fill #1

## 2021-02-10 ENCOUNTER — Other Ambulatory Visit: Payer: Self-pay

## 2021-02-18 ENCOUNTER — Other Ambulatory Visit: Payer: Self-pay

## 2021-02-18 DIAGNOSIS — Z125 Encounter for screening for malignant neoplasm of prostate: Secondary | ICD-10-CM | POA: Diagnosis not present

## 2021-02-18 DIAGNOSIS — E7800 Pure hypercholesterolemia, unspecified: Secondary | ICD-10-CM | POA: Diagnosis not present

## 2021-02-18 DIAGNOSIS — Z79899 Other long term (current) drug therapy: Secondary | ICD-10-CM | POA: Diagnosis not present

## 2021-02-18 DIAGNOSIS — E118 Type 2 diabetes mellitus with unspecified complications: Secondary | ICD-10-CM | POA: Diagnosis not present

## 2021-02-18 DIAGNOSIS — I10 Essential (primary) hypertension: Secondary | ICD-10-CM | POA: Diagnosis not present

## 2021-02-18 MED FILL — Atorvastatin Calcium Tab 10 MG (Base Equivalent): ORAL | 90 days supply | Qty: 90 | Fill #0 | Status: AC

## 2021-02-18 MED FILL — Canagliflozin Tab 300 MG: ORAL | 30 days supply | Qty: 30 | Fill #0 | Status: AC

## 2021-02-19 ENCOUNTER — Other Ambulatory Visit: Payer: Self-pay

## 2021-02-19 MED FILL — Lancets: 50 days supply | Qty: 100 | Fill #1 | Status: AC

## 2021-02-21 ENCOUNTER — Other Ambulatory Visit: Payer: Self-pay

## 2021-02-21 DIAGNOSIS — E7800 Pure hypercholesterolemia, unspecified: Secondary | ICD-10-CM | POA: Diagnosis not present

## 2021-02-21 DIAGNOSIS — I700 Atherosclerosis of aorta: Secondary | ICD-10-CM | POA: Diagnosis not present

## 2021-02-21 DIAGNOSIS — E118 Type 2 diabetes mellitus with unspecified complications: Secondary | ICD-10-CM | POA: Diagnosis not present

## 2021-02-21 DIAGNOSIS — I2510 Atherosclerotic heart disease of native coronary artery without angina pectoris: Secondary | ICD-10-CM | POA: Diagnosis not present

## 2021-02-21 DIAGNOSIS — G4733 Obstructive sleep apnea (adult) (pediatric): Secondary | ICD-10-CM | POA: Diagnosis not present

## 2021-02-21 DIAGNOSIS — I10 Essential (primary) hypertension: Secondary | ICD-10-CM | POA: Diagnosis not present

## 2021-02-21 DIAGNOSIS — Z9889 Other specified postprocedural states: Secondary | ICD-10-CM | POA: Diagnosis not present

## 2021-02-21 MED ORDER — ATORVASTATIN CALCIUM 20 MG PO TABS
ORAL_TABLET | ORAL | 4 refills | Status: DC
  Filled 2021-02-21: qty 90, 90d supply, fill #0
  Filled 2021-05-19: qty 90, 90d supply, fill #1
  Filled 2021-08-17: qty 90, 90d supply, fill #2
  Filled 2021-11-11: qty 90, 90d supply, fill #3
  Filled 2022-02-08: qty 90, 90d supply, fill #4

## 2021-02-21 MED ORDER — ATORVASTATIN CALCIUM 10 MG PO TABS
10.0000 mg | ORAL_TABLET | Freq: Every day | ORAL | 3 refills | Status: DC
  Filled 2021-02-21: qty 90, 90d supply, fill #0

## 2021-02-21 MED ORDER — FREESTYLE LITE TEST VI STRP
ORAL_STRIP | 1 refills | Status: DC
  Filled 2021-02-21: qty 200, 90d supply, fill #0
  Filled 2021-05-23 (×2): qty 200, 90d supply, fill #1

## 2021-02-22 ENCOUNTER — Other Ambulatory Visit: Payer: Self-pay

## 2021-02-23 ENCOUNTER — Other Ambulatory Visit: Payer: Self-pay

## 2021-02-25 ENCOUNTER — Other Ambulatory Visit: Payer: Self-pay

## 2021-02-25 DIAGNOSIS — I2510 Atherosclerotic heart disease of native coronary artery without angina pectoris: Secondary | ICD-10-CM | POA: Diagnosis not present

## 2021-02-25 DIAGNOSIS — Z0000 Encounter for general adult medical examination without abnormal findings: Secondary | ICD-10-CM | POA: Diagnosis not present

## 2021-02-25 DIAGNOSIS — J479 Bronchiectasis, uncomplicated: Secondary | ICD-10-CM | POA: Diagnosis not present

## 2021-02-25 DIAGNOSIS — E782 Mixed hyperlipidemia: Secondary | ICD-10-CM | POA: Diagnosis not present

## 2021-02-25 DIAGNOSIS — E118 Type 2 diabetes mellitus with unspecified complications: Secondary | ICD-10-CM | POA: Diagnosis not present

## 2021-02-25 DIAGNOSIS — Z1159 Encounter for screening for other viral diseases: Secondary | ICD-10-CM | POA: Diagnosis not present

## 2021-02-25 DIAGNOSIS — Z79899 Other long term (current) drug therapy: Secondary | ICD-10-CM | POA: Diagnosis not present

## 2021-02-25 DIAGNOSIS — I10 Essential (primary) hypertension: Secondary | ICD-10-CM | POA: Diagnosis not present

## 2021-02-25 MED ORDER — PREVNAR 13 IM SUSP
INTRAMUSCULAR | 0 refills | Status: DC
  Filled 2021-02-25 – 2021-03-28 (×2): qty 0.5, 1d supply, fill #0

## 2021-02-25 MED ORDER — PREDNISONE 10 MG PO TABS
ORAL_TABLET | ORAL | 0 refills | Status: DC
  Filled 2021-02-25: qty 21, 6d supply, fill #0

## 2021-02-25 MED ORDER — SHINGRIX 50 MCG/0.5ML IM SUSR
INTRAMUSCULAR | 0 refills | Status: DC
  Filled 2021-02-25 – 2021-03-28 (×2): qty 0.5, 1d supply, fill #0

## 2021-02-25 MED ORDER — AZITHROMYCIN 250 MG PO TABS
ORAL_TABLET | ORAL | 0 refills | Status: DC
  Filled 2021-02-25: qty 6, 5d supply, fill #0

## 2021-03-01 ENCOUNTER — Other Ambulatory Visit: Payer: Self-pay

## 2021-03-01 DIAGNOSIS — G4733 Obstructive sleep apnea (adult) (pediatric): Secondary | ICD-10-CM | POA: Diagnosis not present

## 2021-03-02 ENCOUNTER — Other Ambulatory Visit: Payer: Self-pay

## 2021-03-02 MED ORDER — FREESTYLE LITE TEST VI STRP
ORAL_STRIP | 0 refills | Status: DC
  Filled 2021-03-02: qty 100, 50d supply, fill #0

## 2021-03-09 ENCOUNTER — Other Ambulatory Visit: Payer: Self-pay

## 2021-03-11 ENCOUNTER — Other Ambulatory Visit: Payer: Self-pay

## 2021-03-11 MED ORDER — IPRATROPIUM-ALBUTEROL 0.5-2.5 (3) MG/3ML IN SOLN
RESPIRATORY_TRACT | 11 refills | Status: DC
  Filled 2021-03-11: qty 360, 30d supply, fill #0
  Filled 2021-05-12: qty 360, 30d supply, fill #1
  Filled 2021-07-10: qty 360, 30d supply, fill #2
  Filled 2021-09-08 – 2021-09-16 (×3): qty 360, 30d supply, fill #3
  Filled 2021-11-11 – 2021-12-05 (×2): qty 360, 30d supply, fill #4
  Filled 2022-02-03: qty 360, 28d supply, fill #5

## 2021-03-20 MED FILL — Gabapentin Cap 300 MG: ORAL | 30 days supply | Qty: 60 | Fill #1 | Status: AC

## 2021-03-21 ENCOUNTER — Other Ambulatory Visit: Payer: Self-pay

## 2021-03-21 DIAGNOSIS — E119 Type 2 diabetes mellitus without complications: Secondary | ICD-10-CM | POA: Diagnosis not present

## 2021-03-21 DIAGNOSIS — Z7984 Long term (current) use of oral hypoglycemic drugs: Secondary | ICD-10-CM | POA: Diagnosis not present

## 2021-03-21 DIAGNOSIS — G510 Bell's palsy: Secondary | ICD-10-CM | POA: Diagnosis not present

## 2021-03-21 DIAGNOSIS — H35033 Hypertensive retinopathy, bilateral: Secondary | ICD-10-CM | POA: Diagnosis not present

## 2021-03-21 DIAGNOSIS — H2513 Age-related nuclear cataract, bilateral: Secondary | ICD-10-CM | POA: Diagnosis not present

## 2021-03-24 ENCOUNTER — Other Ambulatory Visit: Payer: Self-pay

## 2021-03-24 DIAGNOSIS — G4733 Obstructive sleep apnea (adult) (pediatric): Secondary | ICD-10-CM | POA: Diagnosis not present

## 2021-03-24 MED FILL — Pantoprazole Sodium EC Tab 40 MG (Base Equiv): ORAL | 90 days supply | Qty: 90 | Fill #1 | Status: AC

## 2021-03-24 MED FILL — Canagliflozin Tab 300 MG: ORAL | 30 days supply | Qty: 30 | Fill #1 | Status: AC

## 2021-03-28 ENCOUNTER — Other Ambulatory Visit: Payer: Self-pay

## 2021-03-29 ENCOUNTER — Other Ambulatory Visit: Payer: Self-pay

## 2021-04-13 ENCOUNTER — Other Ambulatory Visit: Payer: Self-pay

## 2021-04-13 MED FILL — Lancets: 50 days supply | Qty: 100 | Fill #2 | Status: AC

## 2021-04-18 ENCOUNTER — Other Ambulatory Visit: Payer: Self-pay

## 2021-04-19 ENCOUNTER — Other Ambulatory Visit: Payer: Self-pay

## 2021-04-19 MED ORDER — BISOPROLOL-HYDROCHLOROTHIAZIDE 5-6.25 MG PO TABS
1.0000 | ORAL_TABLET | Freq: Every day | ORAL | 3 refills | Status: DC
  Filled 2021-04-19: qty 90, 90d supply, fill #0
  Filled 2021-07-20: qty 90, 90d supply, fill #1
  Filled 2021-10-16: qty 90, 90d supply, fill #2
  Filled 2022-01-15: qty 90, 90d supply, fill #3

## 2021-04-23 MED FILL — Canagliflozin Tab 300 MG: ORAL | 30 days supply | Qty: 30 | Fill #2 | Status: AC

## 2021-04-24 DIAGNOSIS — G4733 Obstructive sleep apnea (adult) (pediatric): Secondary | ICD-10-CM | POA: Diagnosis not present

## 2021-04-25 ENCOUNTER — Other Ambulatory Visit: Payer: Self-pay

## 2021-04-26 IMAGING — CT CT HEAD W/O CM
1 series · 16 of 29 positions shown, 20 images · non-contrast
Comparison: None.

CLINICAL DATA: Near syncope, dizziness

EXAM:
CT HEAD WITHOUT CONTRAST
TECHNIQUE: Contiguous axial images were obtained from the base of the skull
through the vertex without intravenous contrast.

[Series 2: head wo · axial · 0.45mm/px · z∈[-188,-58]mm · 16 of 29 slices shown, 20 images]
[im 2/29  brain]
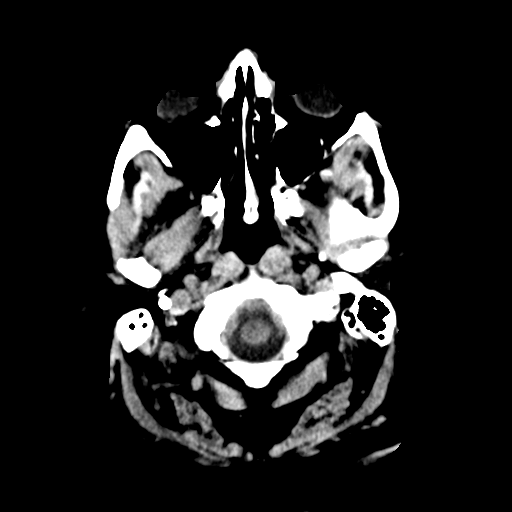
[im 2/29  bone]
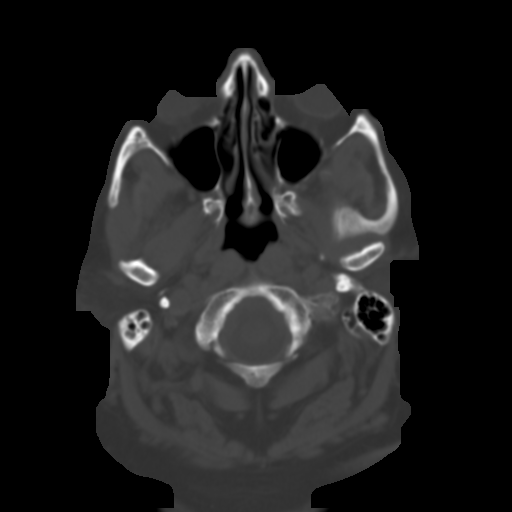
[im 4/29  brain]
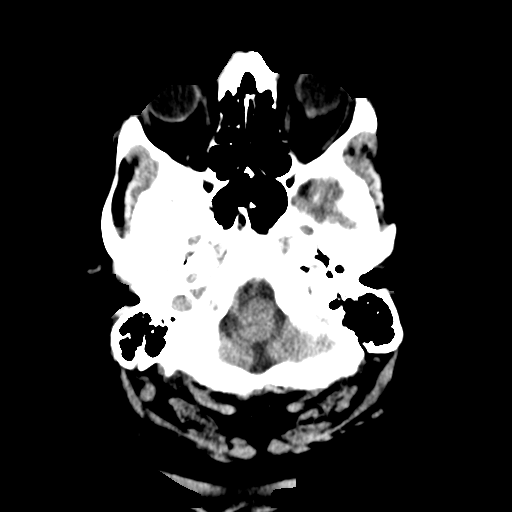
[im 6/29  brain]
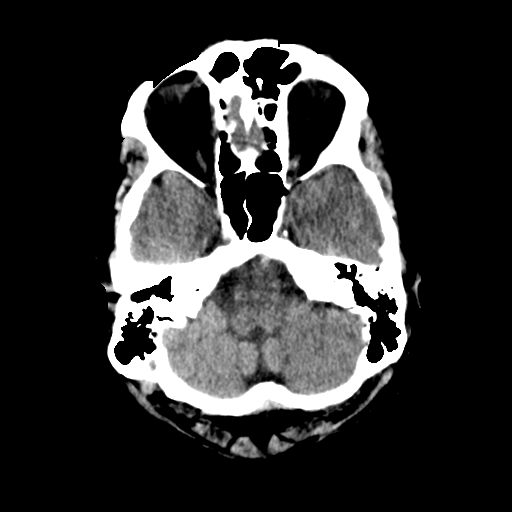
[im 7/29  brain]
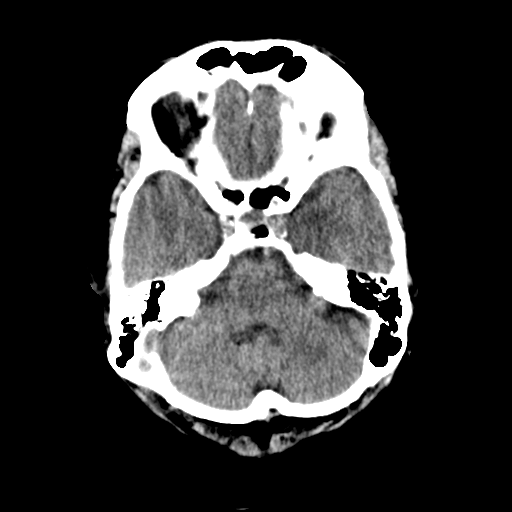
[im 9/29  brain]
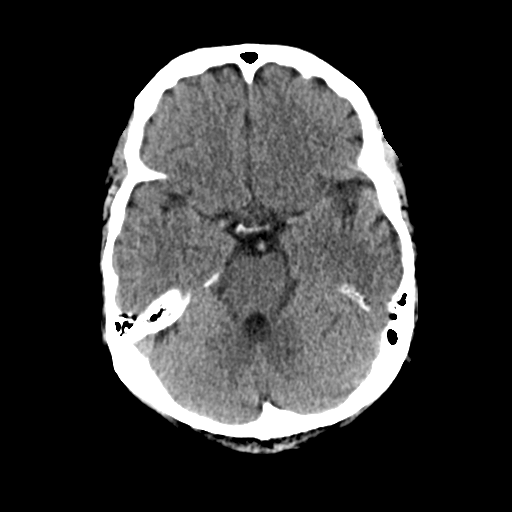
[im 9/29  bone]
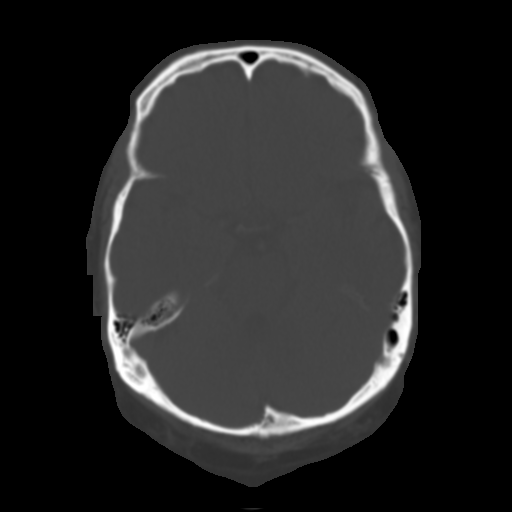
[im 11/29  brain]
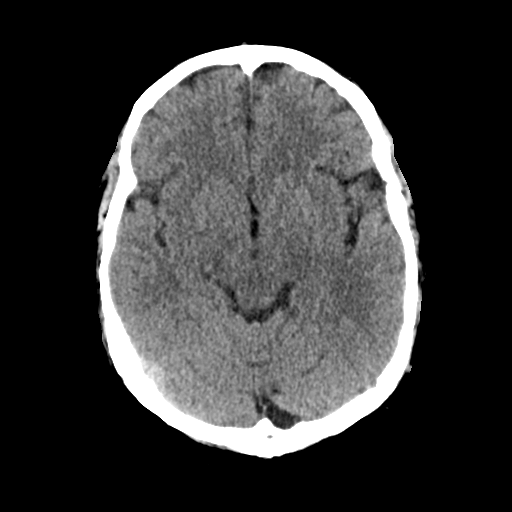
[im 12/29  brain]
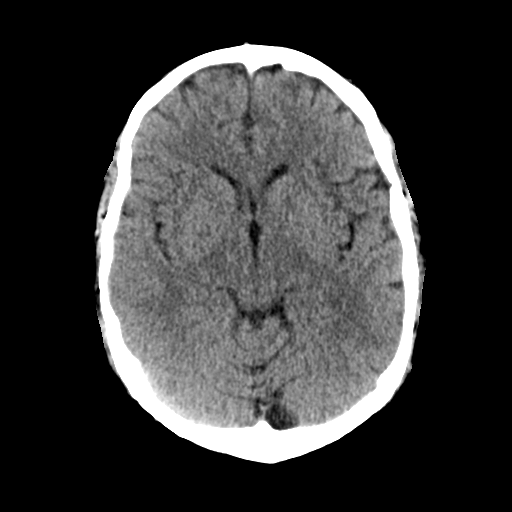
[im 14/29  brain]
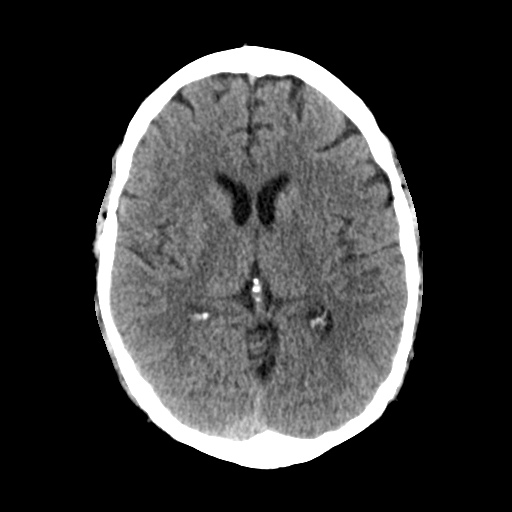
[im 16/29  brain]
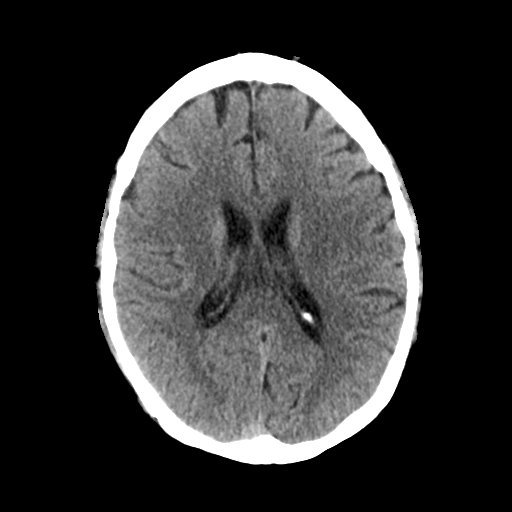
[im 16/29  bone]
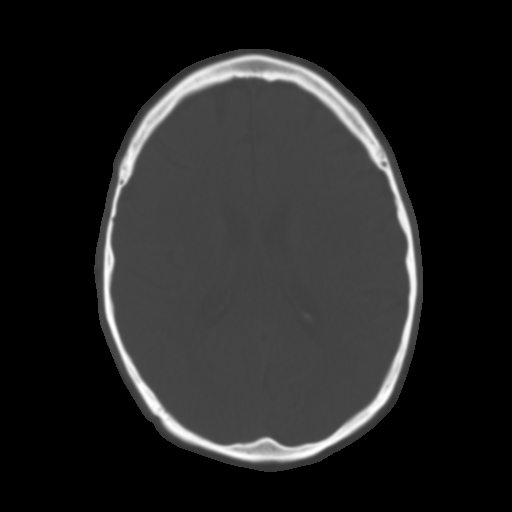
[im 18/29  brain]
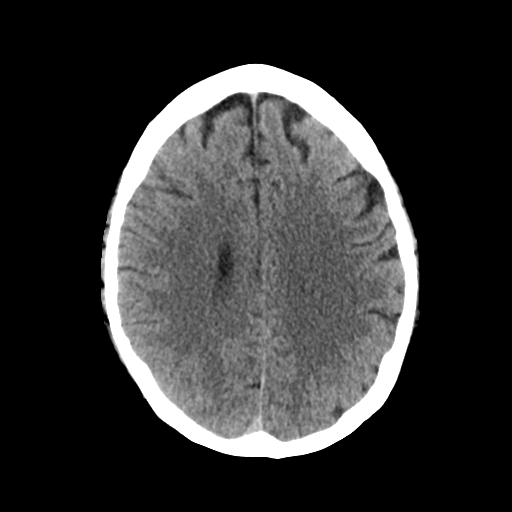
[im 19/29  brain]
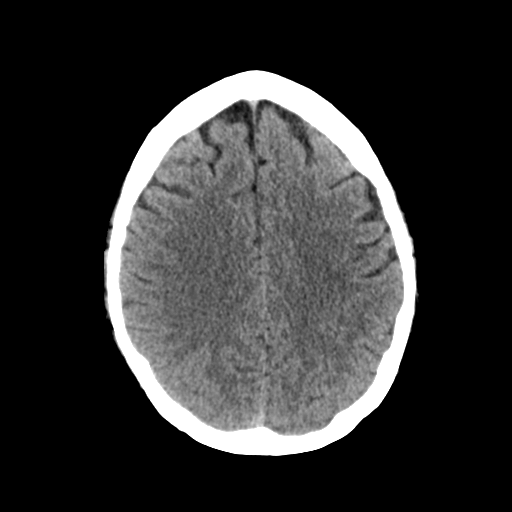
[im 21/29  brain]
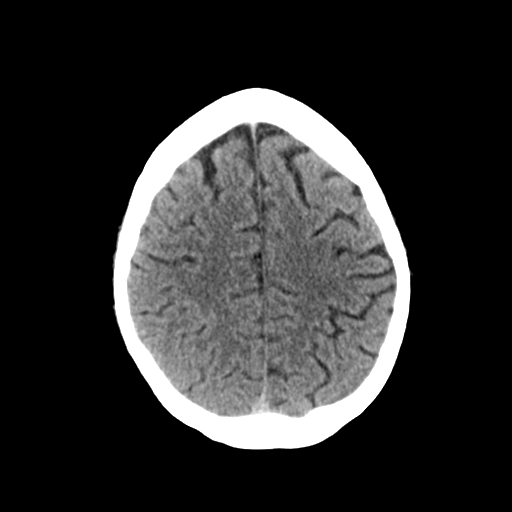
[im 23/29  brain]
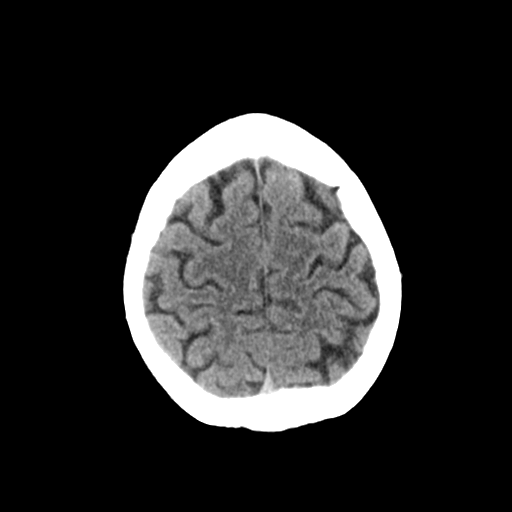
[im 23/29  bone]
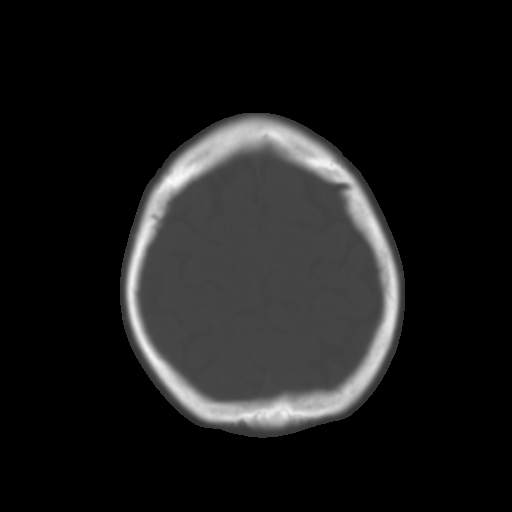
[im 24/29  brain]
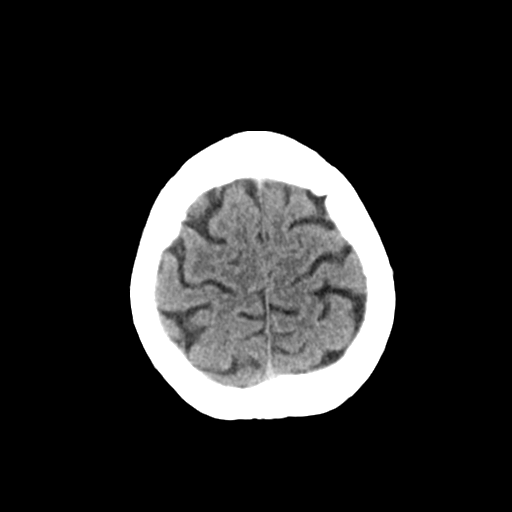
[im 26/29  brain]
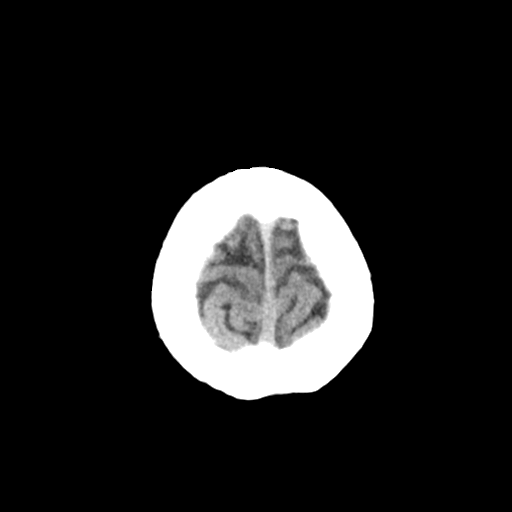
[im 28/29  brain]
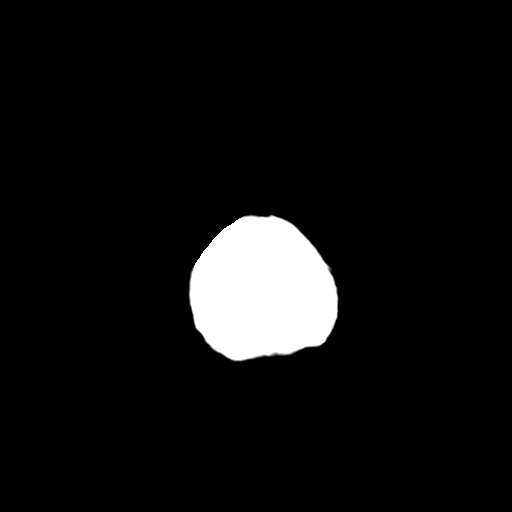

[16 of 29 positions shown; findings below may reference images not displayed]

FINDINGS: Brain: No acute infarct or intracranial hemorrhage. No mass lesion.
No midline shift, ventriculomegaly or extra-axial fluid collection.

Vascular: No hyperdense vessel. Bilateral skull base atherosclerotic
calcifications.

Skull: Negative for fracture or focal lesion.

Sinuses/Orbits: Normal orbits. Clear paranasal sinuses. No mastoid
effusion.

Other: None.
IMPRESSION: No acute intracranial process.

## 2021-04-29 ENCOUNTER — Other Ambulatory Visit: Payer: Self-pay

## 2021-05-12 ENCOUNTER — Other Ambulatory Visit: Payer: Self-pay

## 2021-05-16 ENCOUNTER — Other Ambulatory Visit: Payer: Self-pay

## 2021-05-16 DIAGNOSIS — H2513 Age-related nuclear cataract, bilateral: Secondary | ICD-10-CM | POA: Diagnosis not present

## 2021-05-16 DIAGNOSIS — H35033 Hypertensive retinopathy, bilateral: Secondary | ICD-10-CM | POA: Diagnosis not present

## 2021-05-16 DIAGNOSIS — H52223 Regular astigmatism, bilateral: Secondary | ICD-10-CM | POA: Diagnosis not present

## 2021-05-16 DIAGNOSIS — Z7984 Long term (current) use of oral hypoglycemic drugs: Secondary | ICD-10-CM | POA: Diagnosis not present

## 2021-05-16 DIAGNOSIS — G510 Bell's palsy: Secondary | ICD-10-CM | POA: Diagnosis not present

## 2021-05-16 DIAGNOSIS — H524 Presbyopia: Secondary | ICD-10-CM | POA: Diagnosis not present

## 2021-05-16 DIAGNOSIS — E119 Type 2 diabetes mellitus without complications: Secondary | ICD-10-CM | POA: Diagnosis not present

## 2021-05-16 DIAGNOSIS — H5203 Hypermetropia, bilateral: Secondary | ICD-10-CM | POA: Diagnosis not present

## 2021-05-16 MED FILL — Gabapentin Cap 300 MG: ORAL | 30 days supply | Qty: 60 | Fill #2 | Status: AC

## 2021-05-19 ENCOUNTER — Other Ambulatory Visit: Payer: Self-pay

## 2021-05-23 ENCOUNTER — Other Ambulatory Visit: Payer: Self-pay

## 2021-05-23 DIAGNOSIS — E118 Type 2 diabetes mellitus with unspecified complications: Secondary | ICD-10-CM | POA: Diagnosis not present

## 2021-05-23 DIAGNOSIS — Z79899 Other long term (current) drug therapy: Secondary | ICD-10-CM | POA: Diagnosis not present

## 2021-05-23 DIAGNOSIS — Z1159 Encounter for screening for other viral diseases: Secondary | ICD-10-CM | POA: Diagnosis not present

## 2021-05-23 DIAGNOSIS — E782 Mixed hyperlipidemia: Secondary | ICD-10-CM | POA: Diagnosis not present

## 2021-05-23 MED FILL — Lancets: 50 days supply | Qty: 100 | Fill #3 | Status: AC

## 2021-05-23 MED FILL — Lancets: 50 days supply | Qty: 100 | Fill #3 | Status: CN

## 2021-05-30 ENCOUNTER — Other Ambulatory Visit: Payer: Self-pay

## 2021-05-30 DIAGNOSIS — G4733 Obstructive sleep apnea (adult) (pediatric): Secondary | ICD-10-CM | POA: Diagnosis not present

## 2021-05-30 DIAGNOSIS — Z23 Encounter for immunization: Secondary | ICD-10-CM | POA: Diagnosis not present

## 2021-05-30 DIAGNOSIS — I10 Essential (primary) hypertension: Secondary | ICD-10-CM | POA: Diagnosis not present

## 2021-05-30 DIAGNOSIS — E118 Type 2 diabetes mellitus with unspecified complications: Secondary | ICD-10-CM | POA: Diagnosis not present

## 2021-05-30 DIAGNOSIS — E782 Mixed hyperlipidemia: Secondary | ICD-10-CM | POA: Diagnosis not present

## 2021-05-30 DIAGNOSIS — Z0000 Encounter for general adult medical examination without abnormal findings: Secondary | ICD-10-CM | POA: Diagnosis not present

## 2021-05-30 DIAGNOSIS — I2510 Atherosclerotic heart disease of native coronary artery without angina pectoris: Secondary | ICD-10-CM | POA: Diagnosis not present

## 2021-05-30 DIAGNOSIS — Z79899 Other long term (current) drug therapy: Secondary | ICD-10-CM | POA: Diagnosis not present

## 2021-05-30 MED ORDER — INVOKANA 300 MG PO TABS
ORAL_TABLET | ORAL | 11 refills | Status: DC
  Filled 2021-05-30: qty 30, 30d supply, fill #0
  Filled 2021-06-22: qty 30, 30d supply, fill #1
  Filled 2021-07-24: qty 30, 30d supply, fill #2
  Filled 2021-08-23: qty 30, 30d supply, fill #3
  Filled 2021-09-22: qty 30, 30d supply, fill #4

## 2021-06-13 ENCOUNTER — Other Ambulatory Visit: Payer: Self-pay

## 2021-06-14 ENCOUNTER — Ambulatory Visit: Payer: PPO | Attending: Internal Medicine

## 2021-06-14 ENCOUNTER — Other Ambulatory Visit: Payer: Self-pay

## 2021-06-14 DIAGNOSIS — Z23 Encounter for immunization: Secondary | ICD-10-CM

## 2021-06-14 MED ORDER — PFIZER COVID-19 VAC BIVALENT 30 MCG/0.3ML IM SUSP
INTRAMUSCULAR | 0 refills | Status: DC
  Filled 2021-06-14: qty 0.3, 1d supply, fill #0

## 2021-06-14 NOTE — Progress Notes (Signed)
   Covid-19 Vaccination Clinic  Name:  Danny Maldonado    MRN: [id number] DOB: Jan 02, 1956  06/14/2021  [name] was observed post Covid-19 immunization for 15 minutes without incident. He was provided with Vaccine Information Sheet and instruction to access the V-Safe system.   [name] was instructed to call 911 with any severe reactions post vaccine: Difficulty breathing  Swelling of face and throat  A fast heartbeat  A bad rash all over body  Dizziness and weakness   Lu Duffel, PharmD, MBA Clinical Acute Care Pharmacist

## 2021-06-21 ENCOUNTER — Other Ambulatory Visit: Payer: Self-pay

## 2021-06-21 MED ORDER — ZOSTER VAC RECOMB ADJUVANTED 50 MCG/0.5ML IM SUSR
INTRAMUSCULAR | 0 refills | Status: DC
  Filled 2021-06-28: qty 0.5, 1d supply, fill #0

## 2021-06-22 ENCOUNTER — Other Ambulatory Visit: Payer: Self-pay

## 2021-06-22 MED FILL — Pantoprazole Sodium EC Tab 40 MG (Base Equiv): ORAL | 90 days supply | Qty: 90 | Fill #2 | Status: AC

## 2021-06-28 ENCOUNTER — Other Ambulatory Visit: Payer: Self-pay

## 2021-06-30 ENCOUNTER — Other Ambulatory Visit: Payer: Self-pay

## 2021-07-06 ENCOUNTER — Other Ambulatory Visit: Payer: Self-pay

## 2021-07-06 DIAGNOSIS — R059 Cough, unspecified: Secondary | ICD-10-CM | POA: Diagnosis not present

## 2021-07-06 DIAGNOSIS — Z01818 Encounter for other preprocedural examination: Secondary | ICD-10-CM | POA: Diagnosis not present

## 2021-07-06 MED ORDER — FUROSEMIDE 20 MG PO TABS
ORAL_TABLET | ORAL | 0 refills | Status: DC
  Filled 2021-07-06: qty 30, 30d supply, fill #0

## 2021-07-11 ENCOUNTER — Other Ambulatory Visit: Payer: Self-pay

## 2021-07-12 ENCOUNTER — Other Ambulatory Visit: Payer: Self-pay

## 2021-07-18 MED FILL — Gabapentin Cap 300 MG: ORAL | 30 days supply | Qty: 60 | Fill #3 | Status: AC

## 2021-07-19 ENCOUNTER — Other Ambulatory Visit: Payer: Self-pay

## 2021-07-19 DIAGNOSIS — E118 Type 2 diabetes mellitus with unspecified complications: Secondary | ICD-10-CM | POA: Diagnosis not present

## 2021-07-19 DIAGNOSIS — L255 Unspecified contact dermatitis due to plants, except food: Secondary | ICD-10-CM | POA: Diagnosis not present

## 2021-07-19 MED ORDER — PREDNISONE 10 MG PO TABS
ORAL_TABLET | ORAL | 0 refills | Status: DC
  Filled 2021-07-19: qty 30, 12d supply, fill #0

## 2021-07-19 MED ORDER — HYDROXYZINE PAMOATE 25 MG PO CAPS
ORAL_CAPSULE | ORAL | 0 refills | Status: DC
  Filled 2021-07-19: qty 30, 10d supply, fill #0

## 2021-07-22 ENCOUNTER — Other Ambulatory Visit: Payer: Self-pay

## 2021-07-24 ENCOUNTER — Other Ambulatory Visit: Payer: Self-pay

## 2021-07-25 ENCOUNTER — Other Ambulatory Visit: Payer: Self-pay

## 2021-07-25 MED ORDER — FREESTYLE LANCETS MISC
5 refills | Status: AC
  Filled 2021-07-25: qty 100, 50d supply, fill #0
  Filled 2021-09-07 – 2021-09-09 (×2): qty 100, 50d supply, fill #1

## 2021-07-26 ENCOUNTER — Other Ambulatory Visit: Payer: Self-pay

## 2021-07-26 MED ORDER — FREESTYLE LANCETS MISC
5 refills | Status: DC
  Filled 2021-07-26: qty 100, 50d supply, fill #0

## 2021-07-27 ENCOUNTER — Other Ambulatory Visit: Payer: Self-pay

## 2021-08-16 ENCOUNTER — Other Ambulatory Visit: Payer: Self-pay

## 2021-08-17 ENCOUNTER — Other Ambulatory Visit: Payer: Self-pay

## 2021-08-22 ENCOUNTER — Other Ambulatory Visit: Payer: Self-pay

## 2021-08-23 ENCOUNTER — Other Ambulatory Visit: Payer: Self-pay

## 2021-08-24 DIAGNOSIS — I2510 Atherosclerotic heart disease of native coronary artery without angina pectoris: Secondary | ICD-10-CM | POA: Diagnosis not present

## 2021-08-24 DIAGNOSIS — G4733 Obstructive sleep apnea (adult) (pediatric): Secondary | ICD-10-CM | POA: Diagnosis not present

## 2021-08-24 DIAGNOSIS — Z9889 Other specified postprocedural states: Secondary | ICD-10-CM | POA: Diagnosis not present

## 2021-08-24 DIAGNOSIS — E782 Mixed hyperlipidemia: Secondary | ICD-10-CM | POA: Diagnosis not present

## 2021-08-24 DIAGNOSIS — I700 Atherosclerosis of aorta: Secondary | ICD-10-CM | POA: Diagnosis not present

## 2021-08-24 DIAGNOSIS — I10 Essential (primary) hypertension: Secondary | ICD-10-CM | POA: Diagnosis not present

## 2021-08-25 ENCOUNTER — Other Ambulatory Visit: Payer: Self-pay

## 2021-09-02 DIAGNOSIS — E118 Type 2 diabetes mellitus with unspecified complications: Secondary | ICD-10-CM | POA: Diagnosis not present

## 2021-09-02 DIAGNOSIS — E782 Mixed hyperlipidemia: Secondary | ICD-10-CM | POA: Diagnosis not present

## 2021-09-02 DIAGNOSIS — Z79899 Other long term (current) drug therapy: Secondary | ICD-10-CM | POA: Diagnosis not present

## 2021-09-05 ENCOUNTER — Other Ambulatory Visit: Payer: Self-pay

## 2021-09-05 DIAGNOSIS — E782 Mixed hyperlipidemia: Secondary | ICD-10-CM | POA: Diagnosis not present

## 2021-09-05 DIAGNOSIS — E119 Type 2 diabetes mellitus without complications: Secondary | ICD-10-CM | POA: Diagnosis not present

## 2021-09-05 DIAGNOSIS — I2510 Atherosclerotic heart disease of native coronary artery without angina pectoris: Secondary | ICD-10-CM | POA: Diagnosis not present

## 2021-09-05 DIAGNOSIS — I10 Essential (primary) hypertension: Secondary | ICD-10-CM | POA: Diagnosis not present

## 2021-09-05 DIAGNOSIS — E118 Type 2 diabetes mellitus with unspecified complications: Secondary | ICD-10-CM | POA: Diagnosis not present

## 2021-09-05 DIAGNOSIS — Z79899 Other long term (current) drug therapy: Secondary | ICD-10-CM | POA: Diagnosis not present

## 2021-09-05 MED ORDER — FREESTYLE LITE TEST VI STRP
ORAL_STRIP | 3 refills | Status: DC
  Filled 2021-09-05: qty 200, 90d supply, fill #0
  Filled 2021-12-10: qty 200, 90d supply, fill #1

## 2021-09-05 MED ORDER — OZEMPIC (0.25 OR 0.5 MG/DOSE) 2 MG/1.5ML ~~LOC~~ SOPN
PEN_INJECTOR | SUBCUTANEOUS | 1 refills | Status: DC
  Filled 2021-09-05: qty 1.5, 28d supply, fill #0
  Filled 2021-10-05: qty 1.5, 28d supply, fill #1
  Filled 2021-11-11: qty 1.5, 28d supply, fill #2
  Filled 2021-12-17: qty 1.5, 28d supply, fill #3
  Filled 2022-01-21: qty 1.5, 28d supply, fill #4

## 2021-09-08 ENCOUNTER — Other Ambulatory Visit: Payer: Self-pay

## 2021-09-09 ENCOUNTER — Other Ambulatory Visit: Payer: Self-pay

## 2021-09-11 ENCOUNTER — Other Ambulatory Visit: Payer: Self-pay

## 2021-09-12 ENCOUNTER — Other Ambulatory Visit: Payer: Self-pay

## 2021-09-12 MED ORDER — PANTOPRAZOLE SODIUM 40 MG PO TBEC
DELAYED_RELEASE_TABLET | ORAL | 5 refills | Status: DC
  Filled 2021-09-12: qty 90, 90d supply, fill #0
  Filled 2021-12-10: qty 90, 90d supply, fill #1
  Filled 2022-03-11: qty 90, 90d supply, fill #2
  Filled 2022-06-14: qty 90, 90d supply, fill #3

## 2021-09-12 MED ORDER — PANTOPRAZOLE SODIUM 40 MG PO TBEC
DELAYED_RELEASE_TABLET | Freq: Every day | ORAL | 5 refills | Status: DC
  Filled 2021-09-12: qty 90, 90d supply, fill #0

## 2021-09-14 ENCOUNTER — Other Ambulatory Visit: Payer: Self-pay

## 2021-09-14 DIAGNOSIS — M5416 Radiculopathy, lumbar region: Secondary | ICD-10-CM | POA: Diagnosis not present

## 2021-09-14 DIAGNOSIS — M48061 Spinal stenosis, lumbar region without neurogenic claudication: Secondary | ICD-10-CM | POA: Diagnosis not present

## 2021-09-14 DIAGNOSIS — M5136 Other intervertebral disc degeneration, lumbar region: Secondary | ICD-10-CM | POA: Diagnosis not present

## 2021-09-14 MED ORDER — GABAPENTIN 300 MG PO CAPS
300.0000 mg | ORAL_CAPSULE | Freq: Two times a day (BID) | ORAL | 11 refills | Status: DC
  Filled 2021-09-14: qty 60, 30d supply, fill #0
  Filled 2021-11-11: qty 60, 30d supply, fill #1
  Filled 2022-01-08: qty 60, 30d supply, fill #2
  Filled 2022-03-11: qty 60, 30d supply, fill #3
  Filled 2022-05-13: qty 60, 30d supply, fill #4
  Filled 2022-07-07: qty 60, 30d supply, fill #5
  Filled 2022-09-04: qty 60, 30d supply, fill #6

## 2021-09-16 ENCOUNTER — Other Ambulatory Visit: Payer: Self-pay

## 2021-09-16 ENCOUNTER — Other Ambulatory Visit (HOSPITAL_COMMUNITY): Payer: Self-pay

## 2021-09-20 DIAGNOSIS — G4733 Obstructive sleep apnea (adult) (pediatric): Secondary | ICD-10-CM | POA: Diagnosis not present

## 2021-09-22 ENCOUNTER — Other Ambulatory Visit: Payer: Self-pay

## 2021-09-27 ENCOUNTER — Other Ambulatory Visit: Payer: Self-pay

## 2021-09-28 ENCOUNTER — Other Ambulatory Visit: Payer: Self-pay

## 2021-09-28 MED ORDER — JARDIANCE 25 MG PO TABS
25.0000 mg | ORAL_TABLET | Freq: Every day | ORAL | 11 refills | Status: DC
  Filled 2021-09-28: qty 30, 30d supply, fill #0
  Filled 2021-10-26: qty 30, 30d supply, fill #1
  Filled 2021-11-23: qty 30, 30d supply, fill #2
  Filled 2021-12-23: qty 30, 30d supply, fill #3
  Filled 2022-01-21: qty 30, 30d supply, fill #4
  Filled 2022-02-22: qty 30, 30d supply, fill #5
  Filled 2022-03-24: qty 30, 30d supply, fill #6
  Filled 2022-04-21: qty 30, 30d supply, fill #7
  Filled 2022-05-22: qty 30, 30d supply, fill #8
  Filled 2022-06-22: qty 30, 30d supply, fill #9
  Filled 2022-07-19: qty 30, 30d supply, fill #10
  Filled 2022-08-21: qty 30, 30d supply, fill #11

## 2021-09-29 ENCOUNTER — Other Ambulatory Visit: Payer: Self-pay

## 2021-10-05 ENCOUNTER — Other Ambulatory Visit: Payer: Self-pay

## 2021-10-12 ENCOUNTER — Other Ambulatory Visit: Payer: Self-pay

## 2021-10-17 ENCOUNTER — Other Ambulatory Visit: Payer: Self-pay

## 2021-10-26 ENCOUNTER — Other Ambulatory Visit: Payer: Self-pay

## 2021-11-11 ENCOUNTER — Other Ambulatory Visit: Payer: Self-pay

## 2021-11-15 ENCOUNTER — Other Ambulatory Visit: Payer: Self-pay

## 2021-11-18 ENCOUNTER — Other Ambulatory Visit: Payer: Self-pay

## 2021-11-23 ENCOUNTER — Other Ambulatory Visit: Payer: Self-pay

## 2021-11-23 MED ORDER — ALBUTEROL SULFATE 1.25 MG/3ML IN NEBU
INHALATION_SOLUTION | RESPIRATORY_TRACT | 2 refills | Status: DC
  Filled 2021-11-23: qty 75, 6d supply, fill #0
  Filled 2021-12-04: qty 75, 6d supply, fill #1

## 2021-11-30 ENCOUNTER — Ambulatory Visit: Payer: PPO | Admitting: Dermatology

## 2021-11-30 DIAGNOSIS — L821 Other seborrheic keratosis: Secondary | ICD-10-CM | POA: Diagnosis not present

## 2021-11-30 DIAGNOSIS — L814 Other melanin hyperpigmentation: Secondary | ICD-10-CM | POA: Diagnosis not present

## 2021-11-30 DIAGNOSIS — D229 Melanocytic nevi, unspecified: Secondary | ICD-10-CM | POA: Diagnosis not present

## 2021-11-30 DIAGNOSIS — L578 Other skin changes due to chronic exposure to nonionizing radiation: Secondary | ICD-10-CM

## 2021-11-30 DIAGNOSIS — Z85828 Personal history of other malignant neoplasm of skin: Secondary | ICD-10-CM | POA: Diagnosis not present

## 2021-11-30 DIAGNOSIS — Z1283 Encounter for screening for malignant neoplasm of skin: Secondary | ICD-10-CM | POA: Diagnosis not present

## 2021-11-30 DIAGNOSIS — H61001 Unspecified perichondritis of right external ear: Secondary | ICD-10-CM | POA: Diagnosis not present

## 2021-11-30 DIAGNOSIS — D692 Other nonthrombocytopenic purpura: Secondary | ICD-10-CM | POA: Diagnosis not present

## 2021-11-30 DIAGNOSIS — D1800 Hemangioma unspecified site: Secondary | ICD-10-CM | POA: Diagnosis not present

## 2021-11-30 NOTE — Patient Instructions (Addendum)

## 2021-11-30 NOTE — Progress Notes (Signed)
? ?Follow-Up Visit ?  ?Subjective  ?Danny Maldonado is a 66 y.o. male who presents for the following: Annual Exam (The patient presents for Total-Body Skin Exam (TBSE) for skin cancer screening and mole check.  The patient has spots, moles and lesions to be evaluated, some may be new or changing and the patient has concerns that these could be cancer. ). Hx of SCC ?The following portions of the chart were reviewed this encounter and updated as appropriate:  ? Tobacco  Allergies  Meds  Problems  Med Hx  Surg Hx  Fam Hx   ?  ?Review of Systems:  No other skin or systemic complaints except as noted in HPI or Assessment and Plan. ? ?Objective  ?Well appearing patient in no apparent distress; mood and affect are within normal limits. ? ?A full examination was performed including scalp, head, eyes, ears, nose, lips, neck, chest, axillae, abdomen, back, buttocks, bilateral upper extremities, bilateral lower extremities, hands, feet, fingers, toes, fingernails, and toenails. All findings within normal limits unless otherwise noted below. ? ?R mid ear helix ?Clear  ? ? ?Assessment & Plan  ?Chondrodermatitis nodularis chronica helicis, right ?R mid ear helix ?Chondrodermatitis nodularis helicis of right ear ?Biopsy proven Chrondrodermatitis nodularis helicis of right ear  ?Clear. Observe for recurrence. Call clinic for new or changing lesions.  Recommend regular skin exams, daily broad-spectrum spf 30+ sunscreen use, and photoprotection.    ?Currently calm. ?Benign-appearing.  Observation.  Call clinic for new or changing lesions.  Recommend daily use of broad spectrum spf 30+ sunscreen to sun-exposed areas.  ? ?Skin cancer screening ? ?Lentigines ?- Scattered tan macules ?- Due to sun exposure ?- Benign-appearing, observe ?- Recommend daily broad spectrum sunscreen SPF 30+ to sun-exposed areas, reapply every 2 hours as needed. ?- Call for any changes ? ?Seborrheic Keratoses ?- Stuck-on, waxy, tan-brown papules and/or  plaques  ?- Benign-appearing ?- Discussed benign etiology and prognosis. ?- Observe ?- Call for any changes ? ?Melanocytic Nevi ?- Tan-brown and/or pink-flesh-colored symmetric macules and papules ?- Benign appearing on exam today ?- Observation ?- Call clinic for new or changing moles ?- Recommend daily use of broad spectrum spf 30+ sunscreen to sun-exposed areas.  ? ?Hemangiomas ?- Red papules ?- Discussed benign nature ?- Observe ?- Call for any changes ? ?Actinic Damage ?- Chronic condition, secondary to cumulative UV/sun exposure ?- diffuse scaly erythematous macules with underlying dyspigmentation ?- Recommend daily broad spectrum sunscreen SPF 30+ to sun-exposed areas, reapply every 2 hours as needed.  ?- Staying in the shade or wearing long sleeves, sun glasses (UVA+UVB protection) and wide brim hats (4-inch brim around the entire circumference of the hat) are also recommended for sun protection.  ?- Call for new or changing lesions. ? ?Purpura - Chronic; persistent and recurrent.  Treatable, but not curable. ?- Violaceous macules and patches ?- Benign ?- Related to trauma, age, sun damage and/or use of blood thinners, chronic use of topical and/or oral steroids ?- Observe ?- Can use OTC arnica containing moisturizer such as Dermend Bruise Formula if desired ?- Call for worsening or other concerns  ? ?History of Squamous Cell Carcinoma of the Skin ?Right temple 2011 ?- No evidence of recurrence today ?- No lymphadenopathy ?- Recommend regular full body skin exams ?- Recommend daily broad spectrum sunscreen SPF 30+ to sun-exposed areas, reapply every 2 hours as needed.  ?- Call if any new or changing lesions are noted between office visits ? ?Skin cancer screening performed today.  ? ?  Return in about 1 year (around 12/01/2022) for TBSE, Hx of SCC. ? ?I, Marye Round, CMA, am acting as scribe for Sarina Ser, MD .  ?Documentation: I have reviewed the above documentation for accuracy and completeness, and I  agree with the above. ? ?Sarina Ser, MD ? ?

## 2021-12-01 ENCOUNTER — Encounter: Payer: Self-pay | Admitting: Dermatology

## 2021-12-05 ENCOUNTER — Other Ambulatory Visit: Payer: Self-pay

## 2021-12-07 DIAGNOSIS — E782 Mixed hyperlipidemia: Secondary | ICD-10-CM | POA: Diagnosis not present

## 2021-12-07 DIAGNOSIS — I10 Essential (primary) hypertension: Secondary | ICD-10-CM | POA: Diagnosis not present

## 2021-12-07 DIAGNOSIS — Z79899 Other long term (current) drug therapy: Secondary | ICD-10-CM | POA: Diagnosis not present

## 2021-12-07 DIAGNOSIS — E118 Type 2 diabetes mellitus with unspecified complications: Secondary | ICD-10-CM | POA: Diagnosis not present

## 2021-12-12 ENCOUNTER — Other Ambulatory Visit: Payer: Self-pay

## 2021-12-14 DIAGNOSIS — E782 Mixed hyperlipidemia: Secondary | ICD-10-CM | POA: Diagnosis not present

## 2021-12-14 DIAGNOSIS — I2510 Atherosclerotic heart disease of native coronary artery without angina pectoris: Secondary | ICD-10-CM | POA: Diagnosis not present

## 2021-12-14 DIAGNOSIS — Z125 Encounter for screening for malignant neoplasm of prostate: Secondary | ICD-10-CM | POA: Diagnosis not present

## 2021-12-14 DIAGNOSIS — E118 Type 2 diabetes mellitus with unspecified complications: Secondary | ICD-10-CM | POA: Diagnosis not present

## 2021-12-14 DIAGNOSIS — Z79899 Other long term (current) drug therapy: Secondary | ICD-10-CM | POA: Diagnosis not present

## 2021-12-14 DIAGNOSIS — I10 Essential (primary) hypertension: Secondary | ICD-10-CM | POA: Diagnosis not present

## 2021-12-19 ENCOUNTER — Other Ambulatory Visit: Payer: Self-pay

## 2021-12-23 ENCOUNTER — Other Ambulatory Visit: Payer: Self-pay

## 2021-12-25 ENCOUNTER — Other Ambulatory Visit: Payer: Self-pay

## 2021-12-26 ENCOUNTER — Other Ambulatory Visit: Payer: Self-pay

## 2021-12-26 MED ORDER — MONTELUKAST SODIUM 10 MG PO TABS
10.0000 mg | ORAL_TABLET | Freq: Every day | ORAL | 11 refills | Status: DC
  Filled 2021-12-26: qty 30, 30d supply, fill #0
  Filled 2022-01-23: qty 30, 30d supply, fill #1
  Filled 2022-02-22: qty 30, 30d supply, fill #2
  Filled 2022-03-24: qty 30, 30d supply, fill #3
  Filled 2022-04-21: qty 30, 30d supply, fill #4
  Filled 2022-05-22: qty 30, 30d supply, fill #5
  Filled 2022-06-22: qty 30, 30d supply, fill #6
  Filled 2022-07-19: qty 30, 30d supply, fill #7
  Filled 2022-08-21: qty 30, 30d supply, fill #8
  Filled 2022-09-17: qty 30, 30d supply, fill #9
  Filled 2022-10-25: qty 30, 30d supply, fill #10
  Filled 2022-11-26: qty 30, 30d supply, fill #11

## 2021-12-27 ENCOUNTER — Other Ambulatory Visit: Payer: Self-pay

## 2022-01-03 ENCOUNTER — Other Ambulatory Visit: Payer: Self-pay

## 2022-01-03 DIAGNOSIS — G4733 Obstructive sleep apnea (adult) (pediatric): Secondary | ICD-10-CM | POA: Diagnosis not present

## 2022-01-03 MED ORDER — ALBUTEROL SULFATE HFA 108 (90 BASE) MCG/ACT IN AERS
2.0000 | INHALATION_SPRAY | Freq: Four times a day (QID) | RESPIRATORY_TRACT | 2 refills | Status: DC | PRN
  Filled 2022-01-03: qty 18, 25d supply, fill #0
  Filled 2022-07-17: qty 6.7, 25d supply, fill #1

## 2022-01-08 ENCOUNTER — Other Ambulatory Visit: Payer: Self-pay

## 2022-01-09 ENCOUNTER — Other Ambulatory Visit: Payer: Self-pay

## 2022-01-09 MED ORDER — BUDESONIDE 0.5 MG/2ML IN SUSP
RESPIRATORY_TRACT | 11 refills | Status: DC
  Filled 2022-01-09: qty 120, 30d supply, fill #0
  Filled 2022-02-06: qty 120, 30d supply, fill #1
  Filled 2022-03-11: qty 120, 30d supply, fill #2
  Filled 2022-04-17: qty 120, 30d supply, fill #3
  Filled 2022-05-15: qty 120, 30d supply, fill #4
  Filled 2022-06-14: qty 120, 30d supply, fill #5
  Filled 2022-09-04: qty 120, 30d supply, fill #6
  Filled 2022-11-09: qty 120, 30d supply, fill #7
  Filled 2023-01-08: qty 120, 30d supply, fill #8

## 2022-01-15 ENCOUNTER — Other Ambulatory Visit: Payer: Self-pay

## 2022-01-15 MED ORDER — METFORMIN HCL 1000 MG PO TABS
ORAL_TABLET | ORAL | 3 refills | Status: DC
  Filled 2022-01-15: qty 180, 90d supply, fill #0
  Filled 2022-04-21: qty 180, 90d supply, fill #1
  Filled 2022-07-17: qty 180, 90d supply, fill #2
  Filled 2022-10-15: qty 180, 90d supply, fill #3

## 2022-01-16 ENCOUNTER — Other Ambulatory Visit: Payer: Self-pay

## 2022-01-24 ENCOUNTER — Other Ambulatory Visit: Payer: Self-pay

## 2022-01-25 ENCOUNTER — Other Ambulatory Visit: Payer: Self-pay

## 2022-01-25 MED ORDER — OZEMPIC (0.25 OR 0.5 MG/DOSE) 2 MG/3ML ~~LOC~~ SOPN
PEN_INJECTOR | SUBCUTANEOUS | 3 refills | Status: DC
  Filled 2022-01-25: qty 3, 28d supply, fill #0
  Filled 2022-02-26: qty 3, 28d supply, fill #1
  Filled 2022-03-24: qty 3, 28d supply, fill #2
  Filled 2022-04-21: qty 3, 28d supply, fill #3

## 2022-02-03 ENCOUNTER — Other Ambulatory Visit: Payer: Self-pay

## 2022-02-06 ENCOUNTER — Other Ambulatory Visit: Payer: Self-pay

## 2022-02-07 ENCOUNTER — Other Ambulatory Visit: Payer: Self-pay

## 2022-02-08 ENCOUNTER — Other Ambulatory Visit: Payer: Self-pay

## 2022-02-08 MED ORDER — GLIMEPIRIDE 2 MG PO TABS
ORAL_TABLET | ORAL | 3 refills | Status: DC
  Filled 2022-02-08: qty 90, 90d supply, fill #0
  Filled 2022-05-14: qty 90, 90d supply, fill #1
  Filled 2022-08-09: qty 90, 90d supply, fill #2
  Filled 2022-11-09: qty 90, 90d supply, fill #3

## 2022-02-09 ENCOUNTER — Other Ambulatory Visit: Payer: Self-pay

## 2022-02-22 DIAGNOSIS — I700 Atherosclerosis of aorta: Secondary | ICD-10-CM | POA: Diagnosis not present

## 2022-02-22 DIAGNOSIS — G4733 Obstructive sleep apnea (adult) (pediatric): Secondary | ICD-10-CM | POA: Diagnosis not present

## 2022-02-22 DIAGNOSIS — E782 Mixed hyperlipidemia: Secondary | ICD-10-CM | POA: Diagnosis not present

## 2022-02-22 DIAGNOSIS — I10 Essential (primary) hypertension: Secondary | ICD-10-CM | POA: Diagnosis not present

## 2022-02-22 DIAGNOSIS — I2510 Atherosclerotic heart disease of native coronary artery without angina pectoris: Secondary | ICD-10-CM | POA: Diagnosis not present

## 2022-02-23 ENCOUNTER — Other Ambulatory Visit: Payer: Self-pay

## 2022-02-27 ENCOUNTER — Other Ambulatory Visit: Payer: Self-pay

## 2022-03-09 ENCOUNTER — Other Ambulatory Visit (HOSPITAL_COMMUNITY): Payer: Self-pay

## 2022-03-10 DIAGNOSIS — Z125 Encounter for screening for malignant neoplasm of prostate: Secondary | ICD-10-CM | POA: Diagnosis not present

## 2022-03-10 DIAGNOSIS — E118 Type 2 diabetes mellitus with unspecified complications: Secondary | ICD-10-CM | POA: Diagnosis not present

## 2022-03-10 DIAGNOSIS — Z79899 Other long term (current) drug therapy: Secondary | ICD-10-CM | POA: Diagnosis not present

## 2022-03-10 DIAGNOSIS — I10 Essential (primary) hypertension: Secondary | ICD-10-CM | POA: Diagnosis not present

## 2022-03-12 ENCOUNTER — Other Ambulatory Visit: Payer: Self-pay

## 2022-03-13 ENCOUNTER — Other Ambulatory Visit: Payer: Self-pay

## 2022-03-15 ENCOUNTER — Other Ambulatory Visit: Payer: Self-pay

## 2022-03-15 DIAGNOSIS — I2510 Atherosclerotic heart disease of native coronary artery without angina pectoris: Secondary | ICD-10-CM | POA: Diagnosis not present

## 2022-03-15 DIAGNOSIS — Z0000 Encounter for general adult medical examination without abnormal findings: Secondary | ICD-10-CM | POA: Diagnosis not present

## 2022-03-15 DIAGNOSIS — Z79899 Other long term (current) drug therapy: Secondary | ICD-10-CM | POA: Diagnosis not present

## 2022-03-15 DIAGNOSIS — E118 Type 2 diabetes mellitus with unspecified complications: Secondary | ICD-10-CM | POA: Diagnosis not present

## 2022-03-15 DIAGNOSIS — I10 Essential (primary) hypertension: Secondary | ICD-10-CM | POA: Diagnosis not present

## 2022-03-15 DIAGNOSIS — E782 Mixed hyperlipidemia: Secondary | ICD-10-CM | POA: Diagnosis not present

## 2022-03-15 MED ORDER — ONETOUCH ULTRA VI STRP
ORAL_STRIP | 3 refills | Status: DC
  Filled 2022-03-15: qty 200, 90d supply, fill #0
  Filled 2022-06-14 – 2022-06-16 (×2): qty 200, 90d supply, fill #1
  Filled 2022-09-15: qty 200, 90d supply, fill #2
  Filled 2022-12-14: qty 200, 90d supply, fill #3

## 2022-03-15 MED ORDER — ONETOUCH ULTRA 2 W/DEVICE KIT
PACK | 0 refills | Status: AC
  Filled 2022-03-15: qty 1, 1d supply, fill #0

## 2022-03-15 MED ORDER — ONETOUCH DELICA LANCETS 33G MISC
3 refills | Status: DC
  Filled 2022-03-15: qty 200, 90d supply, fill #0
  Filled 2022-06-14: qty 200, 90d supply, fill #1
  Filled 2022-09-15: qty 200, 90d supply, fill #2
  Filled 2022-12-14: qty 200, 90d supply, fill #3

## 2022-03-16 ENCOUNTER — Other Ambulatory Visit: Payer: Self-pay

## 2022-03-26 ENCOUNTER — Other Ambulatory Visit: Payer: Self-pay

## 2022-03-27 ENCOUNTER — Other Ambulatory Visit: Payer: Self-pay

## 2022-04-05 ENCOUNTER — Other Ambulatory Visit: Payer: Self-pay

## 2022-04-06 ENCOUNTER — Other Ambulatory Visit: Payer: Self-pay

## 2022-04-07 ENCOUNTER — Other Ambulatory Visit: Payer: Self-pay

## 2022-04-07 MED ORDER — IPRATROPIUM-ALBUTEROL 0.5-2.5 (3) MG/3ML IN SOLN
RESPIRATORY_TRACT | 8 refills | Status: DC
  Filled 2022-04-07: qty 360, 30d supply, fill #0
  Filled 2022-06-07 (×2): qty 180, 15d supply, fill #1
  Filled 2022-09-15: qty 360, 30d supply, fill #2
  Filled 2023-02-17: qty 360, 30d supply, fill #3

## 2022-04-16 ENCOUNTER — Other Ambulatory Visit: Payer: Self-pay

## 2022-04-16 MED ORDER — BISOPROLOL-HYDROCHLOROTHIAZIDE 5-6.25 MG PO TABS
1.0000 | ORAL_TABLET | Freq: Every day | ORAL | 3 refills | Status: DC
  Filled 2022-04-16: qty 90, 90d supply, fill #0
  Filled 2022-07-17: qty 90, 90d supply, fill #1
  Filled 2022-10-11: qty 90, 90d supply, fill #2
  Filled 2023-01-08 – 2023-01-09 (×2): qty 90, 90d supply, fill #3

## 2022-04-17 ENCOUNTER — Other Ambulatory Visit: Payer: Self-pay

## 2022-04-21 ENCOUNTER — Other Ambulatory Visit: Payer: Self-pay

## 2022-04-24 ENCOUNTER — Other Ambulatory Visit: Payer: Self-pay

## 2022-05-13 ENCOUNTER — Other Ambulatory Visit: Payer: Self-pay

## 2022-05-14 ENCOUNTER — Other Ambulatory Visit: Payer: Self-pay

## 2022-05-15 ENCOUNTER — Other Ambulatory Visit: Payer: Self-pay

## 2022-05-15 MED ORDER — ATORVASTATIN CALCIUM 20 MG PO TABS
ORAL_TABLET | ORAL | 4 refills | Status: DC
  Filled 2022-05-15: qty 90, 90d supply, fill #0
  Filled 2022-08-09: qty 90, 90d supply, fill #1
  Filled 2022-11-09: qty 90, 90d supply, fill #2
  Filled 2023-02-05: qty 90, 90d supply, fill #3

## 2022-05-18 ENCOUNTER — Other Ambulatory Visit: Payer: Self-pay

## 2022-05-18 MED ORDER — SEMAGLUTIDE(0.25 OR 0.5MG/DOS) 2 MG/3ML ~~LOC~~ SOPN
PEN_INJECTOR | SUBCUTANEOUS | 3 refills | Status: DC
  Filled 2022-05-18: qty 3, 28d supply, fill #0
  Filled 2022-06-14: qty 3, 28d supply, fill #1
  Filled 2022-07-13: qty 3, 28d supply, fill #2
  Filled 2022-08-09: qty 3, 28d supply, fill #3

## 2022-05-22 ENCOUNTER — Other Ambulatory Visit: Payer: Self-pay

## 2022-06-07 ENCOUNTER — Other Ambulatory Visit: Payer: Self-pay

## 2022-06-07 DIAGNOSIS — G43109 Migraine with aura, not intractable, without status migrainosus: Secondary | ICD-10-CM | POA: Diagnosis not present

## 2022-06-07 DIAGNOSIS — H524 Presbyopia: Secondary | ICD-10-CM | POA: Diagnosis not present

## 2022-06-07 DIAGNOSIS — H52223 Regular astigmatism, bilateral: Secondary | ICD-10-CM | POA: Diagnosis not present

## 2022-06-07 DIAGNOSIS — H5203 Hypermetropia, bilateral: Secondary | ICD-10-CM | POA: Diagnosis not present

## 2022-06-07 DIAGNOSIS — H35033 Hypertensive retinopathy, bilateral: Secondary | ICD-10-CM | POA: Diagnosis not present

## 2022-06-07 DIAGNOSIS — H2513 Age-related nuclear cataract, bilateral: Secondary | ICD-10-CM | POA: Diagnosis not present

## 2022-06-07 DIAGNOSIS — E119 Type 2 diabetes mellitus without complications: Secondary | ICD-10-CM | POA: Diagnosis not present

## 2022-06-07 DIAGNOSIS — Z7984 Long term (current) use of oral hypoglycemic drugs: Secondary | ICD-10-CM | POA: Diagnosis not present

## 2022-06-07 DIAGNOSIS — G510 Bell's palsy: Secondary | ICD-10-CM | POA: Diagnosis not present

## 2022-06-13 DIAGNOSIS — E118 Type 2 diabetes mellitus with unspecified complications: Secondary | ICD-10-CM | POA: Diagnosis not present

## 2022-06-13 DIAGNOSIS — Z79899 Other long term (current) drug therapy: Secondary | ICD-10-CM | POA: Diagnosis not present

## 2022-06-13 DIAGNOSIS — E782 Mixed hyperlipidemia: Secondary | ICD-10-CM | POA: Diagnosis not present

## 2022-06-13 DIAGNOSIS — I10 Essential (primary) hypertension: Secondary | ICD-10-CM | POA: Diagnosis not present

## 2022-06-15 ENCOUNTER — Other Ambulatory Visit: Payer: Self-pay

## 2022-06-16 ENCOUNTER — Other Ambulatory Visit: Payer: Self-pay

## 2022-06-20 DIAGNOSIS — Z23 Encounter for immunization: Secondary | ICD-10-CM | POA: Diagnosis not present

## 2022-06-20 DIAGNOSIS — D126 Benign neoplasm of colon, unspecified: Secondary | ICD-10-CM | POA: Diagnosis not present

## 2022-06-20 DIAGNOSIS — I10 Essential (primary) hypertension: Secondary | ICD-10-CM | POA: Diagnosis not present

## 2022-06-20 DIAGNOSIS — E118 Type 2 diabetes mellitus with unspecified complications: Secondary | ICD-10-CM | POA: Diagnosis not present

## 2022-06-20 DIAGNOSIS — I2510 Atherosclerotic heart disease of native coronary artery without angina pectoris: Secondary | ICD-10-CM | POA: Diagnosis not present

## 2022-06-20 DIAGNOSIS — Z79899 Other long term (current) drug therapy: Secondary | ICD-10-CM | POA: Diagnosis not present

## 2022-06-20 DIAGNOSIS — E782 Mixed hyperlipidemia: Secondary | ICD-10-CM | POA: Diagnosis not present

## 2022-06-22 ENCOUNTER — Other Ambulatory Visit: Payer: Self-pay

## 2022-07-03 ENCOUNTER — Other Ambulatory Visit: Payer: Self-pay

## 2022-07-03 MED ORDER — AREXVY 120 MCG/0.5ML IM SUSR
INTRAMUSCULAR | 0 refills | Status: DC
  Filled 2022-07-12: qty 0.5, 1d supply, fill #0

## 2022-07-03 MED ORDER — COVID-19 MRNA VAC-TRIS(PFIZER) 30 MCG/0.3ML IM SUSY
PREFILLED_SYRINGE | INTRAMUSCULAR | 0 refills | Status: DC
  Filled 2022-07-04: qty 0.3, 1d supply, fill #0

## 2022-07-04 ENCOUNTER — Other Ambulatory Visit: Payer: Self-pay

## 2022-07-06 DIAGNOSIS — J4540 Moderate persistent asthma, uncomplicated: Secondary | ICD-10-CM | POA: Diagnosis not present

## 2022-07-06 DIAGNOSIS — R053 Chronic cough: Secondary | ICD-10-CM | POA: Diagnosis not present

## 2022-07-07 ENCOUNTER — Other Ambulatory Visit: Payer: Self-pay

## 2022-07-10 ENCOUNTER — Other Ambulatory Visit: Payer: Self-pay

## 2022-07-12 ENCOUNTER — Other Ambulatory Visit: Payer: Self-pay

## 2022-07-13 ENCOUNTER — Other Ambulatory Visit: Payer: Self-pay

## 2022-07-17 ENCOUNTER — Other Ambulatory Visit: Payer: Self-pay

## 2022-07-19 ENCOUNTER — Other Ambulatory Visit: Payer: Self-pay

## 2022-07-27 ENCOUNTER — Other Ambulatory Visit: Payer: Self-pay

## 2022-08-09 ENCOUNTER — Other Ambulatory Visit: Payer: Self-pay

## 2022-08-16 DIAGNOSIS — I700 Atherosclerosis of aorta: Secondary | ICD-10-CM | POA: Diagnosis not present

## 2022-08-16 DIAGNOSIS — I2510 Atherosclerotic heart disease of native coronary artery without angina pectoris: Secondary | ICD-10-CM | POA: Diagnosis not present

## 2022-08-16 DIAGNOSIS — I10 Essential (primary) hypertension: Secondary | ICD-10-CM | POA: Diagnosis not present

## 2022-08-16 DIAGNOSIS — Z9889 Other specified postprocedural states: Secondary | ICD-10-CM | POA: Diagnosis not present

## 2022-08-16 DIAGNOSIS — G4733 Obstructive sleep apnea (adult) (pediatric): Secondary | ICD-10-CM | POA: Diagnosis not present

## 2022-08-16 DIAGNOSIS — E782 Mixed hyperlipidemia: Secondary | ICD-10-CM | POA: Diagnosis not present

## 2022-09-08 ENCOUNTER — Other Ambulatory Visit: Payer: Self-pay

## 2022-09-10 MED ORDER — SEMAGLUTIDE(0.25 OR 0.5MG/DOS) 2 MG/3ML ~~LOC~~ SOPN
PEN_INJECTOR | SUBCUTANEOUS | 3 refills | Status: DC
  Filled 2022-09-10: qty 3, 28d supply, fill #0
  Filled 2022-10-05: qty 3, 28d supply, fill #1
  Filled 2022-11-02: qty 3, 28d supply, fill #2
  Filled 2022-11-30: qty 3, 28d supply, fill #3

## 2022-09-11 ENCOUNTER — Other Ambulatory Visit: Payer: Self-pay

## 2022-09-13 DIAGNOSIS — E118 Type 2 diabetes mellitus with unspecified complications: Secondary | ICD-10-CM | POA: Diagnosis not present

## 2022-09-13 DIAGNOSIS — E782 Mixed hyperlipidemia: Secondary | ICD-10-CM | POA: Diagnosis not present

## 2022-09-13 DIAGNOSIS — Z79899 Other long term (current) drug therapy: Secondary | ICD-10-CM | POA: Diagnosis not present

## 2022-09-15 ENCOUNTER — Other Ambulatory Visit: Payer: Self-pay

## 2022-09-18 ENCOUNTER — Other Ambulatory Visit: Payer: Self-pay

## 2022-09-18 MED ORDER — PANTOPRAZOLE SODIUM 40 MG PO TBEC
40.0000 mg | DELAYED_RELEASE_TABLET | Freq: Every day | ORAL | 5 refills | Status: DC
  Filled 2022-09-18: qty 90, 90d supply, fill #0
  Filled 2022-12-14: qty 90, 90d supply, fill #1
  Filled 2023-03-12: qty 90, 90d supply, fill #2
  Filled 2023-06-18: qty 90, 90d supply, fill #3
  Filled 2023-09-14: qty 90, 90d supply, fill #4

## 2022-09-19 ENCOUNTER — Other Ambulatory Visit: Payer: Self-pay

## 2022-09-19 MED ORDER — EMPAGLIFLOZIN 25 MG PO TABS
25.0000 mg | ORAL_TABLET | Freq: Every day | ORAL | 11 refills | Status: DC
  Filled 2022-09-19: qty 30, 30d supply, fill #0
  Filled 2022-10-15: qty 30, 30d supply, fill #1
  Filled 2022-11-18: qty 30, 30d supply, fill #2
  Filled 2022-12-17 (×2): qty 30, 30d supply, fill #3
  Filled 2023-01-15: qty 30, 30d supply, fill #4
  Filled 2023-02-17: qty 30, 30d supply, fill #5
  Filled 2023-03-17: qty 30, 30d supply, fill #6
  Filled 2023-04-18: qty 30, 30d supply, fill #7

## 2022-09-20 DIAGNOSIS — Z125 Encounter for screening for malignant neoplasm of prostate: Secondary | ICD-10-CM | POA: Diagnosis not present

## 2022-09-20 DIAGNOSIS — I10 Essential (primary) hypertension: Secondary | ICD-10-CM | POA: Diagnosis not present

## 2022-09-20 DIAGNOSIS — E782 Mixed hyperlipidemia: Secondary | ICD-10-CM | POA: Diagnosis not present

## 2022-09-20 DIAGNOSIS — Z6839 Body mass index (BMI) 39.0-39.9, adult: Secondary | ICD-10-CM | POA: Diagnosis not present

## 2022-09-20 DIAGNOSIS — I2510 Atherosclerotic heart disease of native coronary artery without angina pectoris: Secondary | ICD-10-CM | POA: Diagnosis not present

## 2022-09-20 DIAGNOSIS — E118 Type 2 diabetes mellitus with unspecified complications: Secondary | ICD-10-CM | POA: Diagnosis not present

## 2022-09-20 DIAGNOSIS — D126 Benign neoplasm of colon, unspecified: Secondary | ICD-10-CM | POA: Diagnosis not present

## 2022-09-20 DIAGNOSIS — Z79899 Other long term (current) drug therapy: Secondary | ICD-10-CM | POA: Diagnosis not present

## 2022-09-20 DIAGNOSIS — I700 Atherosclerosis of aorta: Secondary | ICD-10-CM | POA: Diagnosis not present

## 2022-09-23 ENCOUNTER — Other Ambulatory Visit: Payer: Self-pay

## 2022-09-26 ENCOUNTER — Other Ambulatory Visit: Payer: Self-pay

## 2022-10-13 ENCOUNTER — Other Ambulatory Visit: Payer: Self-pay

## 2022-11-09 ENCOUNTER — Other Ambulatory Visit: Payer: Self-pay

## 2022-11-13 ENCOUNTER — Other Ambulatory Visit: Payer: Self-pay

## 2022-11-14 ENCOUNTER — Other Ambulatory Visit: Payer: Self-pay

## 2022-11-14 DIAGNOSIS — M5136 Other intervertebral disc degeneration, lumbar region: Secondary | ICD-10-CM | POA: Diagnosis not present

## 2022-11-14 DIAGNOSIS — M48061 Spinal stenosis, lumbar region without neurogenic claudication: Secondary | ICD-10-CM | POA: Diagnosis not present

## 2022-11-14 DIAGNOSIS — M5416 Radiculopathy, lumbar region: Secondary | ICD-10-CM | POA: Diagnosis not present

## 2022-11-14 MED ORDER — GABAPENTIN 300 MG PO CAPS
300.0000 mg | ORAL_CAPSULE | Freq: Two times a day (BID) | ORAL | 11 refills | Status: DC
  Filled 2022-11-14: qty 60, 30d supply, fill #0
  Filled 2023-01-08 – 2023-01-18 (×2): qty 60, 30d supply, fill #1
  Filled 2023-03-12: qty 60, 30d supply, fill #2
  Filled 2023-05-06: qty 60, 30d supply, fill #3
  Filled 2023-07-07: qty 60, 30d supply, fill #4
  Filled 2023-09-14: qty 60, 30d supply, fill #5
  Filled 2023-10-28 – 2023-10-29 (×2): qty 60, 30d supply, fill #6

## 2022-11-30 ENCOUNTER — Other Ambulatory Visit: Payer: Self-pay

## 2022-12-06 ENCOUNTER — Ambulatory Visit: Payer: PPO | Admitting: Dermatology

## 2022-12-06 VITALS — BP 140/83 | HR 79

## 2022-12-06 DIAGNOSIS — L814 Other melanin hyperpigmentation: Secondary | ICD-10-CM

## 2022-12-06 DIAGNOSIS — L578 Other skin changes due to chronic exposure to nonionizing radiation: Secondary | ICD-10-CM

## 2022-12-06 DIAGNOSIS — L820 Inflamed seborrheic keratosis: Secondary | ICD-10-CM | POA: Diagnosis not present

## 2022-12-06 DIAGNOSIS — L570 Actinic keratosis: Secondary | ICD-10-CM | POA: Diagnosis not present

## 2022-12-06 DIAGNOSIS — D229 Melanocytic nevi, unspecified: Secondary | ICD-10-CM

## 2022-12-06 DIAGNOSIS — L821 Other seborrheic keratosis: Secondary | ICD-10-CM | POA: Diagnosis not present

## 2022-12-06 DIAGNOSIS — D485 Neoplasm of uncertain behavior of skin: Secondary | ICD-10-CM

## 2022-12-06 DIAGNOSIS — Z1283 Encounter for screening for malignant neoplasm of skin: Secondary | ICD-10-CM | POA: Diagnosis not present

## 2022-12-06 DIAGNOSIS — D492 Neoplasm of unspecified behavior of bone, soft tissue, and skin: Secondary | ICD-10-CM

## 2022-12-06 NOTE — Patient Instructions (Addendum)
Wound Care Instructions  Cleanse wound gently with soap and water once a day then pat dry with clean gauze. Apply a thin coat of Petrolatum (petroleum jelly, "Vaseline") over the wound (unless you have an allergy to this). We recommend that you use a new, sterile tube of Vaseline. Do not pick or remove scabs. Do not remove the yellow or white "healing tissue" from the base of the wound.  Cover the wound with fresh, clean, nonstick gauze and secure with paper tape. You may use Band-Aids in place of gauze and tape if the wound is small enough, but would recommend trimming much of the tape off as there is often too much. Sometimes Band-Aids can irritate the skin.  You should call the office for your biopsy report after 1 week if you have not already been contacted.  If you experience any problems, such as abnormal amounts of bleeding, swelling, significant bruising, significant pain, or evidence of infection, please call the office immediately.  FOR ADULT SURGERY PATIENTS: If you need something for pain relief you may take 1 extra strength Tylenol (acetaminophen) AND 2 Ibuprofen (200mg each) together every 4 hours as needed for pain. (do not take these if you are allergic to them or if you have a reason you should not take them.) Typically, you may only need pain medication for 1 to 3 days.      Cryotherapy Aftercare  Wash gently with soap and water everyday.   Apply Vaseline and Band-Aid daily until healed.      Due to recent changes in healthcare laws, you may see results of your pathology and/or laboratory studies on MyChart before the doctors have had a chance to review them. We understand that in some cases there may be results that are confusing or concerning to you. Please understand that not all results are received at the same time and often the doctors may need to interpret multiple results in order to provide you with the best plan of care or course of treatment. Therefore, we ask that  you please give us 2 business days to thoroughly review all your results before contacting the office for clarification. Should we see a critical lab result, you will be contacted sooner.   If You Need Anything After Your Visit  If you have any questions or concerns for your doctor, please call our main line at [phone] and press option 4 to reach your doctor's medical assistant. If no one answers, please leave a voicemail as directed and we will return your call as soon as possible. Messages left after 4 pm will be answered the following business day.   You may also send us a message via MyChart. We typically respond to MyChart messages within 1-2 business days.  For prescription refills, please ask your pharmacy to contact our office. Our fax number is [phone].  If you have an urgent issue when the clinic is closed that cannot wait until the next business day, you can page your doctor at the number below.    Please note that while we do our best to be available for urgent issues outside of office hours, we are not available 24/7.   If you have an urgent issue and are unable to reach us, you may choose to seek medical care at your doctor's office, retail clinic, urgent care center, or emergency room.  If you have a medical emergency, please immediately call 911 or go to the emergency department.  Pager Numbers  - Dr.   Kowalski: [phone]  - Dr. Moye: [phone]  - Dr. Stewart: [phone]  In the event of inclement weather, please call our main line at [phone] for an update on the status of any delays or closures.  Dermatology Medication Tips: Please keep the boxes that topical medications come in in order to help keep track of the instructions about where and how to use these. Pharmacies typically print the medication instructions only on the boxes and not directly on the medication tubes.   If your medication is too expensive, please contact our office at  [phone] option 4 or send us a message through MyChart.   We are unable to tell what your co-pay for medications will be in advance as this is different depending on your insurance coverage. However, we may be able to find a substitute medication at lower cost or fill out paperwork to get insurance to cover a needed medication.   If a prior authorization is required to get your medication covered by your insurance company, please allow us 1-2 business days to complete this process.  Drug prices often vary depending on where the prescription is filled and some pharmacies may offer cheaper prices.  The website www.goodrx.com contains coupons for medications through different pharmacies. The prices here do not account for what the cost may be with help from insurance (it may be cheaper with your insurance), but the website can give you the price if you did not use any insurance.  - You can print the associated coupon and take it with your prescription to the pharmacy.  - You may also stop by our office during regular business hours and pick up a GoodRx coupon card.  - If you need your prescription sent electronically to a different pharmacy, notify our office through [facility] MyChart or by phone at [phone] option 4.     Si Usted Necesita Algo Despus de Su Visita  Tambin puede enviarnos un mensaje a travs de MyChart. Por lo general respondemos a los mensajes de MyChart en el transcurso de 1 a 2 das hbiles.  Para renovar recetas, por favor pida a su farmacia que se ponga en contacto con nuestra oficina. Nuestro nmero de fax es el [phone].  Si tiene un asunto urgente cuando la clnica est cerrada y que no puede esperar hasta el siguiente da hbil, puede llamar/localizar a su doctor(a) al nmero que aparece a continuacin.   Por favor, tenga en cuenta que aunque hacemos todo lo posible para estar disponibles para asuntos urgentes fuera del horario de oficina, no estamos  disponibles las 24 horas del da, los 7 das de la semana.   Si tiene un problema urgente y no puede comunicarse con nosotros, puede optar por buscar atencin mdica  en el consultorio de su doctor(a), en una clnica privada, en un centro de atencin urgente o en una sala de emergencias.  Si tiene una emergencia mdica, por favor llame inmediatamente al 911 o vaya a la sala de emergencias.  Nmeros de bper  - Dr. Kowalski: [phone]  - Dra. Moye: [phone]  - Dra. Stewart: [phone]  En caso de inclemencias del tiempo, por favor llame a nuestra lnea principal al [phone] para una actualizacin sobre el estado de cualquier retraso o cierre.  Consejos para la medicacin en dermatologa: Por favor, guarde las cajas en las que vienen los medicamentos de uso tpico para ayudarle a seguir las instrucciones sobre dnde y cmo usarlos. Las farmacias generalmente imprimen las instrucciones del medicamento slo   en las cajas y no directamente en los tubos del medicamento.   Si su medicamento es muy caro, por favor, pngase en contacto con nuestra oficina llamando al [phone] y presione la opcin 4 o envenos un mensaje a travs de MyChart.   No podemos decirle cul ser su copago por los medicamentos por adelantado ya que esto es diferente dependiendo de la cobertura de su seguro. Sin embargo, es posible que podamos encontrar un medicamento sustituto a menor costo o llenar un formulario para que el seguro cubra el medicamento que se considera necesario.   Si se requiere una autorizacin previa para que su compaa de seguros cubra su medicamento, por favor permtanos de 1 a 2 das hbiles para completar este proceso.  Los precios de los medicamentos varan con frecuencia dependiendo del lugar de dnde se surte la receta y alguna farmacias pueden ofrecer precios ms baratos.  El sitio web www.goodrx.com tiene cupones para medicamentos de diferentes farmacias. Los precios aqu no  tienen en cuenta lo que podra costar con la ayuda del seguro (puede ser ms barato con su seguro), pero el sitio web puede darle el precio si no utiliz ningn seguro.  - Puede imprimir el cupn correspondiente y llevarlo con su receta a la farmacia.  - Tambin puede pasar por nuestra oficina durante el horario de atencin regular y recoger una tarjeta de cupones de GoodRx.  - Si necesita que su receta se enve electrnicamente a una farmacia diferente, informe a nuestra oficina a travs de MyChart de [place] o por telfono llamando al [phone] y presione la opcin 4.  

## 2022-12-06 NOTE — Progress Notes (Signed)
Follow-Up Visit   Subjective  Danny Maldonado is a 67 y.o. male who presents for the following: Skin Cancer Screening and Full Body Skin Exam, hx of SCC. Patient c/o recurrent growth on the right ear.  The patient presents for Total-Body Skin Exam (TBSE) for skin cancer screening and mole check. The patient has spots, moles and lesions to be evaluated, some may be new or changing and the patient has concerns that these could be cancer.  The following portions of the chart were reviewed this encounter and updated as appropriate: medications, allergies, medical history  Review of Systems:  No other skin or systemic complaints except as noted in HPI or Assessment and Plan.  Objective  Well appearing patient in no apparent distress; mood and affect are within normal limits.  A full examination was performed including scalp, head, eyes, ears, nose, lips, neck, chest, axillae, abdomen, back, buttocks, bilateral upper extremities, bilateral lower extremities, hands, feet, fingers, toes, fingernails, and toenails. All findings within normal limits unless otherwise noted below.   Relevant physical exam findings are noted in the Assessment and Plan.     right mid helix 0.6 cm hyperkeratotic papule    Assessment & Plan   LENTIGINES, SEBORRHEIC KERATOSES, HEMANGIOMAS - Benign normal skin lesions - Benign-appearing - Call for any changes  MELANOCYTIC NEVI - Tan-brown and/or pink-flesh-colored symmetric macules and papules - Benign appearing on exam today - Observation - Call clinic for new or changing moles - Recommend daily use of broad spectrum spf 30+ sunscreen to sun-exposed areas.   ACTINIC DAMAGE - Chronic condition, secondary to cumulative UV/sun exposure - diffuse scaly erythematous macules with underlying dyspigmentation - Recommend daily broad spectrum sunscreen SPF 30+ to sun-exposed areas, reapply every 2 hours as needed.  - Staying in the shade or wearing long sleeves,  sun glasses (UVA+UVB protection) and wide brim hats (4-inch brim around the entire circumference of the hat) are also recommended for sun protection.  - Call for new or changing lesions.  INFLAMED SEBORRHEIC KERATOSIS Exam: Erythematous keratotic or waxy stuck-on papule or plaque.  Symptomatic, irritating, patient would like treated.  Benign-appearing.  Call clinic for new or changing lesions.   Prior to procedure, discussed risks of blister formation, small wound, skin dyspigmentation, or rare scar following treatment. Recommend Vaseline ointment to treated areas while healing.  Destruction Procedure Note Destruction method: cryotherapy   Informed consent: discussed and consent obtained   Lesion destroyed using liquid nitrogen: Yes   Outcome: patient tolerated procedure well with no complications   Post-procedure details: wound care instructions given   Locations: left arm  # of Lesions Treated: 3   ACTINIC KERATOSIS Exam: Erythematous thin papules/macules with gritty scale  Actinic keratoses are precancerous spots that appear secondary to cumulative UV radiation exposure/sun exposure over time. They are chronic with expected duration over 1 year. A portion of actinic keratoses will progress to squamous cell carcinoma of the skin. It is not possible to reliably predict which spots will progress to skin cancer and so treatment is recommended to prevent development of skin cancer.  Recommend daily broad spectrum sunscreen SPF 30+ to sun-exposed areas, reapply every 2 hours as needed.  Recommend staying in the shade or wearing long sleeves, sun glasses (UVA+UVB protection) and wide brim hats (4-inch brim around the entire circumference of the hat). Call for new or changing lesions.  Treatment Plan:  Prior to procedure, discussed risks of blister formation, small wound, skin dyspigmentation, or rare scar following  cryotherapy. Recommend Vaseline ointment to treated areas while  healing.  Destruction Procedure Note Destruction method: cryotherapy   Informed consent: discussed and consent obtained   Lesion destroyed using liquid nitrogen: Yes   Outcome: patient tolerated procedure well with no complications   Post-procedure details: wound care instructions given   Locations: right cheek in beard area  # of Lesions Treated: 1   HISTORY OF SQUAMOUS CELL CARCINOMA OF THE SKIN - No evidence of recurrence today - No lymphadenopathy - Recommend regular full body skin exams - Recommend daily broad spectrum sunscreen SPF 30+ to sun-exposed areas, reapply every 2 hours as needed.  - Call if any new or changing lesions are noted between office visits   SKIN CANCER SCREENING PERFORMED TODAY.  Neoplasm of skin right mid helix  Epidermal / dermal shaving  Lesion diameter (cm):  0.6 Informed consent: discussed and consent obtained   Timeout: patient name, date of birth, surgical site, and procedure verified   Procedure prep:  Patient was prepped and draped in usual sterile fashion Prep type:  Isopropyl alcohol Anesthesia: the lesion was anesthetized in a standard fashion   Anesthetic:  1% lidocaine w/ epinephrine 1-100,000 buffered w/ 8.4% NaHCO3 Hemostasis achieved with: pressure, aluminum chloride and electrodesiccation   Outcome: patient tolerated procedure well   Post-procedure details: sterile dressing applied and wound care instructions given   Dressing type: bandage and petrolatum    Destruction of lesion  Destruction method: electrodesiccation and curettage   Informed consent: discussed and consent obtained   Timeout:  patient name, date of birth, surgical site, and procedure verified Anesthesia: the lesion was anesthetized in a standard fashion   Anesthetic:  1% lidocaine w/ epinephrine 1-100,000 buffered w/ 8.4% NaHCO3 Curettage performed in three different directions: Yes   Electrodesiccation performed over the curetted area: Yes   Curettage cycles:   3 Lesion length (cm):  0.6 Lesion width (cm):  0.6 Margin per side (cm):  0.2 Final wound size (cm):  1 Hemostasis achieved with:  electrodesiccation Outcome: patient tolerated procedure well with no complications   Post-procedure details: sterile dressing applied and wound care instructions given   Dressing type: petrolatum    Specimen 1 - Surgical pathology Differential Diagnosis: R/O SCC  Check Margins: No   Return in about 1 year (around 12/06/2023) for TBSE, hx of SCC.  IAngelique Holm, CMA, am acting as scribe for Armida Sans, MD .   Documentation: I have reviewed the above documentation for accuracy and completeness, and I agree with the above.  Armida Sans, MD

## 2022-12-12 ENCOUNTER — Telehealth: Payer: Self-pay

## 2022-12-12 NOTE — Telephone Encounter (Signed)
-----   Message from Deirdre Evener, MD sent at 12/12/2022 12:34 PM EDT ----- Diagnosis Skin , right mid helix HYPERTROPHIC ACTINIC KERATOSIS  PreCancer  Already treated Recheck next visit

## 2022-12-12 NOTE — Telephone Encounter (Signed)
Advised patient of results/hd  

## 2022-12-14 ENCOUNTER — Other Ambulatory Visit: Payer: Self-pay

## 2022-12-15 ENCOUNTER — Other Ambulatory Visit: Payer: Self-pay

## 2022-12-17 ENCOUNTER — Other Ambulatory Visit: Payer: Self-pay

## 2022-12-18 ENCOUNTER — Other Ambulatory Visit: Payer: Self-pay

## 2022-12-21 ENCOUNTER — Other Ambulatory Visit: Payer: Self-pay

## 2022-12-22 ENCOUNTER — Other Ambulatory Visit: Payer: Self-pay

## 2022-12-22 MED ORDER — MONTELUKAST SODIUM 10 MG PO TABS
10.0000 mg | ORAL_TABLET | Freq: Every evening | ORAL | 1 refills | Status: DC
  Filled 2022-12-22: qty 90, 90d supply, fill #0
  Filled 2023-03-25: qty 90, 90d supply, fill #1

## 2022-12-24 ENCOUNTER — Encounter: Payer: Self-pay | Admitting: Dermatology

## 2022-12-29 ENCOUNTER — Other Ambulatory Visit: Payer: Self-pay

## 2022-12-29 MED ORDER — SEMAGLUTIDE(0.25 OR 0.5MG/DOS) 2 MG/3ML ~~LOC~~ SOPN
0.5000 mg | PEN_INJECTOR | SUBCUTANEOUS | 3 refills | Status: DC
  Filled 2022-12-29: qty 3, 28d supply, fill #0
  Filled 2023-01-24: qty 3, 28d supply, fill #1
  Filled 2023-02-22: qty 3, 28d supply, fill #2

## 2023-01-08 ENCOUNTER — Other Ambulatory Visit: Payer: Self-pay

## 2023-01-08 MED ORDER — METFORMIN HCL 1000 MG PO TABS
1000.0000 mg | ORAL_TABLET | Freq: Two times a day (BID) | ORAL | 3 refills | Status: DC
  Filled 2023-01-08: qty 180, 90d supply, fill #0
  Filled 2023-04-03: qty 180, 90d supply, fill #1
  Filled 2023-07-07: qty 180, 90d supply, fill #2
  Filled 2023-10-08: qty 180, 90d supply, fill #3

## 2023-01-09 ENCOUNTER — Other Ambulatory Visit: Payer: Self-pay

## 2023-01-11 ENCOUNTER — Other Ambulatory Visit: Payer: Self-pay

## 2023-01-15 ENCOUNTER — Other Ambulatory Visit: Payer: Self-pay

## 2023-01-18 ENCOUNTER — Other Ambulatory Visit: Payer: Self-pay

## 2023-01-18 MED ORDER — BUDESONIDE 0.5 MG/2ML IN SUSP
RESPIRATORY_TRACT | 5 refills | Status: DC
  Filled 2023-01-18: qty 120, 30d supply, fill #0
  Filled 2023-03-17: qty 120, 30d supply, fill #1
  Filled 2023-05-29: qty 120, 30d supply, fill #2
  Filled 2023-08-05: qty 120, 30d supply, fill #3
  Filled 2023-10-08: qty 120, 30d supply, fill #4
  Filled 2023-11-24: qty 120, 30d supply, fill #5

## 2023-01-24 ENCOUNTER — Other Ambulatory Visit: Payer: Self-pay

## 2023-02-05 ENCOUNTER — Other Ambulatory Visit: Payer: Self-pay

## 2023-02-05 MED ORDER — GLIMEPIRIDE 2 MG PO TABS
2.0000 mg | ORAL_TABLET | Freq: Every day | ORAL | 1 refills | Status: DC
  Filled 2023-02-05: qty 90, 90d supply, fill #0
  Filled 2023-05-06: qty 90, 90d supply, fill #1

## 2023-02-18 ENCOUNTER — Other Ambulatory Visit: Payer: Self-pay

## 2023-02-19 ENCOUNTER — Other Ambulatory Visit: Payer: Self-pay

## 2023-02-20 ENCOUNTER — Other Ambulatory Visit: Payer: Self-pay

## 2023-02-22 ENCOUNTER — Other Ambulatory Visit: Payer: Self-pay

## 2023-02-23 ENCOUNTER — Other Ambulatory Visit: Payer: Self-pay

## 2023-03-06 ENCOUNTER — Other Ambulatory Visit: Payer: Self-pay

## 2023-03-07 ENCOUNTER — Other Ambulatory Visit: Payer: Self-pay

## 2023-03-07 MED ORDER — OZEMPIC (0.25 OR 0.5 MG/DOSE) 2 MG/3ML ~~LOC~~ SOPN
0.5000 mg | PEN_INJECTOR | SUBCUTANEOUS | 3 refills | Status: DC
  Filled 2023-03-23: qty 15, 140d supply, fill #0
  Filled 2023-08-31: qty 3, 28d supply, fill #1

## 2023-03-19 ENCOUNTER — Other Ambulatory Visit: Payer: Self-pay

## 2023-03-19 DIAGNOSIS — E782 Mixed hyperlipidemia: Secondary | ICD-10-CM | POA: Diagnosis not present

## 2023-03-19 DIAGNOSIS — Z125 Encounter for screening for malignant neoplasm of prostate: Secondary | ICD-10-CM | POA: Diagnosis not present

## 2023-03-19 DIAGNOSIS — I10 Essential (primary) hypertension: Secondary | ICD-10-CM | POA: Diagnosis not present

## 2023-03-19 DIAGNOSIS — E118 Type 2 diabetes mellitus with unspecified complications: Secondary | ICD-10-CM | POA: Diagnosis not present

## 2023-03-19 DIAGNOSIS — Z79899 Other long term (current) drug therapy: Secondary | ICD-10-CM | POA: Diagnosis not present

## 2023-03-23 ENCOUNTER — Other Ambulatory Visit: Payer: Self-pay

## 2023-03-24 ENCOUNTER — Emergency Department: Payer: PPO

## 2023-03-24 ENCOUNTER — Other Ambulatory Visit: Payer: Self-pay

## 2023-03-24 ENCOUNTER — Emergency Department
Admission: EM | Admit: 2023-03-24 | Discharge: 2023-03-24 | Disposition: A | Discharge status: Home / Self Care | Payer: PPO | Attending: Emergency Medicine | Admitting: Emergency Medicine

## 2023-03-24 DIAGNOSIS — R2231 Localized swelling, mass and lump, right upper limb: Secondary | ICD-10-CM | POA: Diagnosis present

## 2023-03-24 DIAGNOSIS — I10 Essential (primary) hypertension: Secondary | ICD-10-CM | POA: Diagnosis not present

## 2023-03-24 DIAGNOSIS — M7989 Other specified soft tissue disorders: Secondary | ICD-10-CM | POA: Diagnosis not present

## 2023-03-24 DIAGNOSIS — L089 Local infection of the skin and subcutaneous tissue, unspecified: Secondary | ICD-10-CM | POA: Insufficient documentation

## 2023-03-24 DIAGNOSIS — E119 Type 2 diabetes mellitus without complications: Secondary | ICD-10-CM | POA: Diagnosis not present

## 2023-03-24 DIAGNOSIS — M79644 Pain in right finger(s): Secondary | ICD-10-CM | POA: Diagnosis not present

## 2023-03-24 MED ORDER — CEPHALEXIN 500 MG PO CAPS: 500.0000 mg | ORAL_CAPSULE | Freq: Three times a day (TID) | ORAL | 0 refills | Status: AC

## 2023-03-24 MED ORDER — SULFAMETHOXAZOLE-TRIMETHOPRIM 800-160 MG PO TABS: 1.0000 | ORAL_TABLET | Freq: Two times a day (BID) | ORAL | 0 refills | Status: DC

## 2023-03-24 MED ORDER — TRAMADOL HCL 50 MG PO TABS: 50.0000 mg | ORAL_TABLET | Freq: Four times a day (QID) | ORAL | 0 refills | Status: DC | PRN

## 2023-03-24 NOTE — ED Notes (Signed)
See triage notes. Patient's right middle finger is noticeably swollen. Bruising and other discoloring noted as well. There is a single, white spot noted to the pad of the top of the finger where the patient believes the initial bite/sting occurred. Patient is able to move finger some.

## 2023-03-24 NOTE — ED Triage Notes (Signed)
Pt reports got bit or stung by something yesterday. Reports last pm his right hand middle finger turned red and started hurting and swelling and this am it was worse. Pt reports he remembers putting his hand in something and feeling a sting so is not sure what got him. Swelling, redness and discoloration noted to right hand middle finger.

## 2023-03-26 ENCOUNTER — Other Ambulatory Visit: Payer: Self-pay

## 2023-03-26 DIAGNOSIS — Z0000 Encounter for general adult medical examination without abnormal findings: Secondary | ICD-10-CM | POA: Diagnosis not present

## 2023-03-26 DIAGNOSIS — E118 Type 2 diabetes mellitus with unspecified complications: Secondary | ICD-10-CM | POA: Diagnosis not present

## 2023-03-26 DIAGNOSIS — E782 Mixed hyperlipidemia: Secondary | ICD-10-CM | POA: Diagnosis not present

## 2023-03-26 DIAGNOSIS — Z79899 Other long term (current) drug therapy: Secondary | ICD-10-CM | POA: Diagnosis not present

## 2023-03-26 DIAGNOSIS — Z6839 Body mass index (BMI) 39.0-39.9, adult: Secondary | ICD-10-CM | POA: Diagnosis not present

## 2023-03-26 DIAGNOSIS — I10 Essential (primary) hypertension: Secondary | ICD-10-CM | POA: Diagnosis not present

## 2023-03-26 DIAGNOSIS — I2510 Atherosclerotic heart disease of native coronary artery without angina pectoris: Secondary | ICD-10-CM | POA: Diagnosis not present

## 2023-03-26 MED ORDER — PREDNISONE 10 MG PO TABS
ORAL_TABLET | ORAL | 0 refills | Status: DC
  Filled 2023-03-26: qty 20, 8d supply, fill #0

## 2023-03-26 MED ORDER — BOOSTRIX 5-2.5-18.5 LF-MCG/0.5 IM SUSY
0.5000 mL | PREFILLED_SYRINGE | Freq: Once | INTRAMUSCULAR | 0 refills | Status: AC
  Filled 2023-03-26: qty 0.5, 1d supply, fill #0

## 2023-03-26 NOTE — ED Provider Notes (Signed)
[facility] Provider Note    Event Date/Time   First MD Initiated Contact with Patient 03/24/23 [phone]     (approximate)   History   Insect Bite and Hand Pain   HPI  Danny Maldonado is a 67 y.o. male with history of diabetes, hypertension, GERD and as listed in EMR presents to the emergency department for treatment and evaluation of pain and discoloration and swelling to the middle finger of his right hand.  He remembers feeling something sting him yesterday but did not see what it was.      Physical Exam   Triage Vital Signs: ED Triage Vitals [03/24/23 0733]  Encounter Vitals Group     BP 121/72     Systolic BP Percentile      Diastolic BP Percentile      Pulse Rate 75     Resp 20     Temp 97.7 F (36.5 C)     Temp Source Oral     SpO2 94 %     Weight (!) 308 lb 10.3 oz (140 kg)     Height 5\' 8"  (1.727 m)     Head Circumference      Peak Flow      Pain Score 7     Pain Loc      Pain Education      Exclude from Growth Chart     Most recent vital signs: Vitals:   03/24/23 0733  BP: 121/72  Pulse: 75  Resp: 20  Temp: 97.7 F (36.5 C)  SpO2: 94%    General: Awake, no distress.  CV:  Good peripheral perfusion.  Resp:  Normal effort.  Abd:  No distention.  Other:  Right middle finger erythematous with area of ecchymosis to the finger pad.  No obvious site for incision and drainage.  Patient is able to bend the finger and form a fist.   ED Results / Procedures / Treatments   Labs (all labs ordered are listed, but only abnormal results are displayed) Labs Reviewed - No data to display   EKG     RADIOLOGY  Image and radiology report reviewed and interpreted by me. Radiology report consistent with the same.  Image of the right middle finger negative for retained foreign body or any evidence of bony erosion.  PROCEDURES:  Critical Care performed: No  Procedures   MEDICATIONS ORDERED IN ED:  Medications - No data to  display   IMPRESSION / MDM / ASSESSMENT AND PLAN / ED COURSE   I have reviewed the triage note.  Differential diagnosis includes, but is not limited to, retained foreign body, insect sting, tenosynovitis  Patient's presentation is most consistent with acute complicated illness / injury requiring diagnostic workup.  67 year old male presenting to the emergency department for treatment and evaluation of right middle finger pain and swelling plus discoloration that started yesterday when he felt something sting him.  See HPI for further details.  X-ray shows no retained foreign body.  Plan will be to treat him with antibiotics and have him do Epsom salt soaks.  He will also be given a short course of tramadol to help with pain.  He is to follow-up with his primary care provider if symptoms or not improving over the next 2 to 3 days.  If symptoms change or worsen and he is unable to see primary care, he is to return to the emergency department.      FINAL CLINICAL IMPRESSION(S) /  ED DIAGNOSES   Final diagnoses:  Finger infection     Rx / DC Orders   ED Discharge Orders          Ordered    sulfamethoxazole-trimethoprim (BACTRIM DS) 800-160 MG tablet  2 times daily        03/24/23 0853    cephALEXin (KEFLEX) 500 MG capsule  3 times daily        03/24/23 0853    traMADol (ULTRAM) 50 MG tablet  Every 6 hours PRN        03/24/23 0853             Note:  This document was prepared using Dragon voice recognition software and may include unintentional dictation errors.   Chinita Pester, FNP 03/26/23 2347    Merwyn Katos, MD 03/30/23 [phone]

## 2023-03-28 ENCOUNTER — Other Ambulatory Visit: Payer: Self-pay

## 2023-03-29 ENCOUNTER — Telehealth: Payer: Self-pay

## 2023-03-29 NOTE — Telephone Encounter (Signed)
Transition Care Management Unsuccessful Follow-up Telephone Call  Date of discharge and from where:  03/24/2023 [name]  Attempts:  1st Attempt  Reason for unsuccessful TCM follow-up call:  Left voice message  Danny Maldonado [name]  [name] Population Health Community Resource Care Guide   [name]  ?? [phone]   [name]  [name]

## 2023-03-30 ENCOUNTER — Telehealth: Payer: Self-pay

## 2023-03-30 NOTE — Telephone Encounter (Signed)
Transition Care Management Unsuccessful Follow-up Telephone Call  Date of discharge and from where:  03/24/2023 [facility]  Attempts:  2nd Attempt  Reason for unsuccessful TCM follow-up call:  Left voice message  Lorice Lafave [name]  [facility] Population Health Community Resource Care Guide   [email or web address]  ?? [phone]   [name]  [email or web address]

## 2023-04-03 ENCOUNTER — Other Ambulatory Visit: Payer: Self-pay

## 2023-04-03 DIAGNOSIS — J45909 Unspecified asthma, uncomplicated: Secondary | ICD-10-CM | POA: Diagnosis not present

## 2023-04-03 DIAGNOSIS — J4540 Moderate persistent asthma, uncomplicated: Secondary | ICD-10-CM | POA: Diagnosis not present

## 2023-04-03 MED ORDER — GLUCOSE BLOOD VI STRP
1.0000 | ORAL_STRIP | Freq: Two times a day (BID) | 3 refills | Status: DC
  Filled 2023-04-03: qty 200, 100d supply, fill #0
  Filled 2023-07-07: qty 200, 100d supply, fill #1
  Filled 2023-10-28 – 2023-10-29 (×2): qty 200, 100d supply, fill #2
  Filled 2024-02-03: qty 200, 100d supply, fill #3

## 2023-04-03 MED ORDER — BISOPROLOL-HYDROCHLOROTHIAZIDE 5-6.25 MG PO TABS
1.0000 | ORAL_TABLET | Freq: Every day | ORAL | 3 refills | Status: DC
  Filled 2023-04-03: qty 90, 90d supply, fill #0
  Filled 2023-07-07: qty 90, 90d supply, fill #1
  Filled 2023-10-08: qty 90, 90d supply, fill #2
  Filled 2024-01-04: qty 90, 90d supply, fill #3

## 2023-04-03 MED ORDER — ONETOUCH DELICA LANCETS 33G MISC
1.0000 | Freq: Two times a day (BID) | 3 refills | Status: AC
  Filled 2023-04-03: qty 200, 100d supply, fill #0
  Filled 2023-07-07: qty 200, 100d supply, fill #1
  Filled 2023-10-28 – 2023-10-29 (×2): qty 200, 100d supply, fill #2
  Filled 2024-02-03: qty 200, 100d supply, fill #3

## 2023-04-06 ENCOUNTER — Other Ambulatory Visit: Payer: Self-pay

## 2023-04-17 ENCOUNTER — Other Ambulatory Visit: Payer: Self-pay

## 2023-04-17 DIAGNOSIS — K219 Gastro-esophageal reflux disease without esophagitis: Secondary | ICD-10-CM | POA: Diagnosis not present

## 2023-04-17 DIAGNOSIS — Z86010 Personal history of colonic polyps: Secondary | ICD-10-CM | POA: Diagnosis not present

## 2023-04-17 DIAGNOSIS — Z800 Family history of malignant neoplasm of digestive organs: Secondary | ICD-10-CM | POA: Diagnosis not present

## 2023-04-17 MED ORDER — NA SULFATE-K SULFATE-MG SULF 17.5-3.13-1.6 GM/177ML PO SOLN
1.0000 | ORAL | 0 refills | Status: DC
  Filled 2023-04-17: qty 354, 1d supply, fill #0

## 2023-04-18 ENCOUNTER — Other Ambulatory Visit: Payer: Self-pay

## 2023-04-20 ENCOUNTER — Other Ambulatory Visit: Payer: Self-pay

## 2023-04-21 DIAGNOSIS — N390 Urinary tract infection, site not specified: Secondary | ICD-10-CM | POA: Diagnosis not present

## 2023-04-21 DIAGNOSIS — R300 Dysuria: Secondary | ICD-10-CM | POA: Diagnosis not present

## 2023-04-21 DIAGNOSIS — Z8639 Personal history of other endocrine, nutritional and metabolic disease: Secondary | ICD-10-CM | POA: Diagnosis not present

## 2023-04-30 ENCOUNTER — Other Ambulatory Visit: Payer: Self-pay

## 2023-04-30 MED ORDER — NITROFURANTOIN MONOHYD MACRO 100 MG PO CAPS
100.0000 mg | ORAL_CAPSULE | Freq: Two times a day (BID) | ORAL | 0 refills | Status: DC
  Filled 2023-04-30: qty 14, 7d supply, fill #0

## 2023-05-01 ENCOUNTER — Other Ambulatory Visit: Payer: Self-pay

## 2023-05-08 ENCOUNTER — Other Ambulatory Visit: Payer: Self-pay

## 2023-05-08 DIAGNOSIS — N390 Urinary tract infection, site not specified: Secondary | ICD-10-CM | POA: Diagnosis not present

## 2023-05-15 ENCOUNTER — Other Ambulatory Visit: Payer: Self-pay

## 2023-05-15 DIAGNOSIS — Z1624 Resistance to multiple antibiotics: Secondary | ICD-10-CM | POA: Diagnosis not present

## 2023-05-15 DIAGNOSIS — Z8619 Personal history of other infectious and parasitic diseases: Secondary | ICD-10-CM | POA: Diagnosis not present

## 2023-05-15 DIAGNOSIS — N390 Urinary tract infection, site not specified: Secondary | ICD-10-CM | POA: Diagnosis not present

## 2023-05-15 DIAGNOSIS — R300 Dysuria: Secondary | ICD-10-CM | POA: Diagnosis not present

## 2023-05-15 MED ORDER — FOSFOMYCIN TROMETHAMINE 3 G PO PACK
PACK | ORAL | 0 refills | Status: DC
  Filled 2023-05-15: qty 9, 18d supply, fill #0
  Filled 2023-05-15: qty 3, 6d supply, fill #0

## 2023-05-16 ENCOUNTER — Other Ambulatory Visit: Payer: Self-pay

## 2023-05-17 ENCOUNTER — Other Ambulatory Visit: Payer: Self-pay

## 2023-05-17 MED ORDER — ATORVASTATIN CALCIUM 20 MG PO TABS
20.0000 mg | ORAL_TABLET | Freq: Every day | ORAL | 3 refills | Status: DC
  Filled 2023-05-17: qty 90, 90d supply, fill #0
  Filled 2023-08-28: qty 90, 90d supply, fill #1
  Filled 2023-11-24: qty 90, 90d supply, fill #2
  Filled 2024-03-02: qty 90, 90d supply, fill #3

## 2023-05-22 DIAGNOSIS — R399 Unspecified symptoms and signs involving the genitourinary system: Secondary | ICD-10-CM | POA: Diagnosis not present

## 2023-05-31 ENCOUNTER — Ambulatory Visit: Payer: PPO | Admitting: Urology

## 2023-06-04 ENCOUNTER — Encounter: Payer: Self-pay | Admitting: Urology

## 2023-06-04 ENCOUNTER — Ambulatory Visit: Payer: PPO | Admitting: Urology

## 2023-06-04 VITALS — BP 125/74 | HR 82 | Ht 68.0 in | Wt 246.0 lb

## 2023-06-04 DIAGNOSIS — R3129 Other microscopic hematuria: Secondary | ICD-10-CM | POA: Diagnosis not present

## 2023-06-04 DIAGNOSIS — N390 Urinary tract infection, site not specified: Secondary | ICD-10-CM

## 2023-06-04 LAB — BLADDER SCAN AMB NON-IMAGING: Scan Result: 103

## 2023-06-04 MED ORDER — TAMSULOSIN HCL 0.4 MG PO CAPS: 0.4000 mg | ORAL_CAPSULE | Freq: Every day | ORAL | 0 refills | Status: DC

## 2023-06-04 NOTE — Progress Notes (Signed)
I, Maysun Anabel Bene, acting as a scribe for Riki Altes, MD., have documented all relevant documentation on the behalf of Riki Altes, MD, as directed by Riki Altes, MD while in the presence of Riki Altes, MD.  06/04/2023 1:52 PM   Danny Maldonado 02/15/1956 [id number]  Referring provider: Bobby Rumpf, MD [address] [place],  [place] [address]  Chief Complaint  Patient presents with   Follow-up    HPI: Danny Maldonado is a 67 y.o. male referred for a recurrent UTI.  Acute care visit 04/21/23 with complaints of dysuria, frequency, and subjective fever x24 hours. He did have a temp up to 99. Urinalysis showed 4-10 WBC/10-50 RBC, and he was started on Cipro. Urine culture grew MDR ESBL E. coli.  His antibiotic was changed to [facility], which he took for 7 days with improvement in his symptoms, however symptoms recurred after 3-4 days. Follow-up acute care visit 05/15/23 with complaints of increased frequency, urgency, and dysuria x2 days. Urinalysis again showed pyuria and he was treated with fosfomycin 3 grams every other day x3 doses. He again had resolution of his symptoms and presently has not had recurrent dysuria.  Denies baseline voiding symptoms including hesitancy, slow stream, and urinary frequency. Prior to his most recent episodes, no history of recurrent UTI.    PMH: Past Medical History:  Diagnosis Date   Actinic keratosis    Allergy    Cancer (HCC)    Diabetes mellitus without complication (HCC)    DJD (degenerative joint disease)    Gastritis    GERD (gastroesophageal reflux disease)    Hyperlipidemia    Sleep apnea    Squamous cell carcinoma of skin 06/08/2010   R temple    Surgical History: Past Surgical History:  Procedure Laterality Date   CARDIAC CATHETERIZATION     CHOLECYSTECTOMY     COLONOSCOPY WITH PROPOFOL N/A 09/26/2017   Procedure: COLONOSCOPY WITH PROPOFOL;  Surgeon: Scot Jun, MD;  Location: [facility] ENDOSCOPY;   Service: Endoscopy;  Laterality: N/A;   HERNIA REPAIR     right trigger thumb release     VASECTOMY      Home Medications:  Allergies as of 06/04/2023   No Known Allergies      Medication List        Accurate as of June 04, 2023  1:52 PM. If you have any questions, ask your nurse or doctor.          STOP taking these medications    aspirin EC 81 MG tablet Stopped by: Riki Altes   cetirizine 10 MG tablet Commonly known as: ZYRTEC Stopped by: Riki Altes   famotidine 20 MG tablet Commonly known as: PEPCID Stopped by: Verna Czech Ishana Blades   fluticasone 50 MCG/ACT nasal spray Commonly known as: FLONASE Stopped by: Riki Altes   HYDROcodone-acetaminophen 7.5-325 MG tablet Commonly known as: NORCO Stopped by: Riki Altes   lisinopril 10 MG tablet Commonly known as: ZESTRIL Stopped by: Riki Altes   loratadine 10 MG tablet Commonly known as: CLARITIN Stopped by: Riki Altes   meloxicam 15 MG tablet Commonly known as: MOBIC Stopped by: Riki Altes   metoCLOPramide 10 MG tablet Commonly known as: REGLAN Stopped by: Riki Altes   omeprazole 20 MG capsule Commonly known as: PRILOSEC Stopped by: Riki Altes   sucralfate 1 g tablet Commonly known as: Carafate Stopped by: Riki Altes  tiZANidine 4 MG capsule Commonly known as: ZANAFLEX Stopped by: Riki Altes   triamcinolone ointment 0.5 % Commonly known as: KENALOG Stopped by: Riki Altes   Voltaren 1 % Gel Generic drug: diclofenac sodium Stopped by: Riki Altes       TAKE these medications    acetaminophen 500 MG tablet Commonly known as: TYLENOL Take 1,000 mg by mouth every 6 (six) hours as needed (PRN - takes approximately once per week).   albuterol 1.25 MG/3ML nebulizer solution Commonly known as: ACCUNEB Use one vial via nebulizer every 6 hours as needed for wheezing (Take 3 mLs (1.25 mg total) by nebulization every 6 (six) hours as  needed for Wheezing)   albuterol 108 (90 Base) MCG/ACT inhaler Commonly known as: VENTOLIN HFA Inhale 2 puffs into the lungs every 6 (six) hours as needed for wheezing.   Arexvy 120 MCG/0.5ML injection Generic drug: RSV vaccine recomb adjuvanted Inject into the muscle.   atorvastatin 10 MG tablet Commonly known as: LIPITOR Take 10 mg by mouth daily.   atorvastatin 10 MG tablet Commonly known as: LIPITOR Take 1 tablet (10 mg total) by mouth once daily   atorvastatin 20 MG tablet Commonly known as: LIPITOR Take 1 tablet (20 mg total) by mouth once daily   azithromycin 250 MG tablet Commonly known as: ZITHROMAX Take 2 tablets by mouth daily on day 1 then 1 tablet daily on days 2-5. (2 tabs on day one, then 1 tab daily x 4 days)   bisoprolol-hydrochlorothiazide 5-6.25 MG tablet Commonly known as: ZIAC Take 1 tablet by mouth daily.   bisoprolol-hydrochlorothiazide 5-6.25 MG tablet Commonly known as: ZIAC Take 1 tablet by mouth daily.   budesonide 0.5 MG/2ML nebulizer solution Commonly known as: PULMICORT Inhale into the lungs.   budesonide 0.5 MG/2ML nebulizer solution Commonly known as: PULMICORT USE 2 MILLILITERS (ML) BY NEBULIZATION 2 TIMES DAILY   Comirnaty syringe Generic drug: COVID-19 mRNA vaccine (Pfizer) Inject into the muscle.   Cyanocobalamin 500 MCG Subl Place 1,000 mcg under the tongue daily.   erythromycin ophthalmic ointment Place 1 application into the left eye at bedtime.   erythromycin ophthalmic ointment Apply small amount to eye lids at night as needed (apply small amount to eye lids at night as needed)   fosfomycin 3 g Pack Commonly known as: MONUROL Dissolve 1 packet (3 grams) in 3-4 ounces of water and take by mouth every other day for 3 doses.   freestyle lancets Use 2 times daily as instructed (2 (two) times daily Use as instructed.)   OneTouch Delica Plus Lancet33G Misc Use to test twice a day (1 each by Other route 2 (two) times  daily.)   FREESTYLE LITE test strip Generic drug: glucose blood 2 (two) times daily Use as instructed.   FREESTYLE LITE test strip Generic drug: glucose blood USE AS DIRECTED 2 TIMES A DAY   FREESTYLE LITE test strip Generic drug: glucose blood Test 2 (two) times daily as instructed.   OneTouch Ultra Test test strip Generic drug: glucose blood Test twice a day (1 each by Other route 2 (two) times daily.)   furosemide 20 MG tablet Commonly known as: LASIX Take 1 tablet (20 mg total) by mouth once daily as needed for Edema   gabapentin 300 MG capsule Commonly known as: NEURONTIN TAKE 1 CAPSULE BY MOUTH TWICE DAILY AS TOLERATED   gabapentin 300 MG capsule Commonly known as: NEURONTIN Take 1 capsule (300 mg total) by mouth 2 (two) times  daily.   glimepiride 2 MG tablet Commonly known as: AMARYL Take 2 mg by mouth daily with breakfast.   glimepiride 2 MG tablet Commonly known as: AMARYL TAKE 1 TABLET BY MOUTH DAILY WITH BREAKFAST   glimepiride 2 MG tablet Commonly known as: AMARYL Take 1 tablet (2 mg total) by mouth daily with breakfast.   hydrOXYzine 25 MG capsule Commonly known as: VISTARIL Take 1 capsule (25 mg total) by mouth 3 (three) times daily as needed for Itching for up to 10 days   Invokana 300 MG Tabs tablet Generic drug: canagliflozin TAKE 1 TABLET BY MOUTH EVERY MORNING BEFORE BREAKFAST   Invokana 300 MG Tabs tablet Generic drug: canagliflozin Take 1 tablet (300 mg total) by mouth every morning before breakfast   ipratropium-albuterol 0.5-2.5 (3) MG/3ML Soln Commonly known as: DUONEB Take by nebulization.   ipratropium-albuterol 0.5-2.5 (3) MG/3ML Soln Commonly known as: DUONEB Take 3 mLs by nebulization 4 (four) times daily as needed for Wheezing   Jardiance 25 MG Tabs tablet Generic drug: empagliflozin Take 1 tablet (25 mg total) by mouth once daily   magnesium oxide 400 MG tablet Commonly known as: MAG-OX Take 400 mg by mouth daily.    meclizine 25 MG tablet Commonly known as: ANTIVERT take 1/2 to 1 tablet by mouth 3 times a day as needed for dizziness   metFORMIN 1000 MG tablet Commonly known as: GLUCOPHAGE Take 1,000 mg by mouth 2 (two) times daily with a meal.   metFORMIN 1000 MG tablet Commonly known as: GLUCOPHAGE Take 1 tablet (1,000 mg total) by mouth 2 (two) times daily with a meal.   montelukast 10 MG tablet Commonly known as: SINGULAIR Take by mouth.   montelukast 10 MG tablet Commonly known as: SINGULAIR TAKE 1 TABLET BY MOUTH NIGHTLY   montelukast 10 MG tablet Commonly known as: SINGULAIR Take 1 tablet (10 mg total) by mouth at bedtime.   multivitamin tablet Take 1 tablet by mouth daily.   Na Sulfate-K Sulfate-Mg Sulf 17.5-3.13-1.6 GM/177ML Soln Take 1 Bottle by mouth as directed. One kit contains 2 bottles.  Take both bottles at the times instructed by your provider.   nitrofurantoin (macrocrystal-monohydrate) 100 MG capsule Commonly known as: MACROBID Take 1 capsule (100 mg total) by mouth 2 (two) times daily for 7 days   ONE TOUCH ULTRA 2 w/Device Kit Use as instructed.   Ozempic (0.25 or 0.5 MG/DOSE) 2 MG/3ML Sopn Generic drug: Semaglutide(0.25 or 0.5MG /DOS) Inject 0.5 mg into the skin once a week.   pantoprazole 40 MG tablet Commonly known as: PROTONIX Take 40 mg by mouth daily.   pantoprazole 40 MG tablet Commonly known as: PROTONIX TAKE 1 TABLET BY MOUTH ONCE DAILY   pantoprazole 40 MG tablet Commonly known as: PROTONIX Take 1 tablet (40 mg total) by mouth once daily   pantoprazole 40 MG tablet Commonly known as: PROTONIX Take 1 tablet (40 mg total) by mouth daily.   Pfizer COVID-19 Vac Bivalent injection Generic drug: COVID-19 mRNA bivalent vaccine Proofreader) Inject into the muscle.   predniSONE 10 MG tablet Commonly known as: DELTASONE Take 6 tabs on day 1, 5 tabs on day 2, 4 tabs on day 3, 3 tabs on day 4, 2 tabs on day 5, 1 tab on day 6. Then stop. (Taper  6-5-4-3-2-1-off)   predniSONE 10 MG tablet Commonly known as: DELTASONE Take 4 tablets by mouth daily for 3 days, then 3 tabs daily for 3 days, then 2 tabs daily for 3 days, then  1 tab daily for 3 days. (Take 4 tabs daily for 3 days, then 3 tabs daily x 3 days, then 2 tabs daily for 3 days, then 1 tab daily x 3 days.)   Prevnar 13 Susp injection Generic drug: pneumococcal 13-valent conjugate vaccine Inject 0.5 mLs into the muscle once for 1 dose   Shingrix injection Generic drug: Zoster Vaccine Adjuvanted Inject into the muscle.   sulfamethoxazole-trimethoprim 800-160 MG tablet Commonly known as: BACTRIM DS Take 1 tablet by mouth 2 (two) times daily.   tamsulosin 0.4 MG Caps capsule Commonly known as: FLOMAX Take 1 capsule (0.4 mg total) by mouth daily. Started by: Riki Altes   traMADol 50 MG tablet Commonly known as: Ultram Take 1 tablet (50 mg total) by mouth every 6 (six) hours as needed.        Allergies: No Known Allergies  Family History: Family History  Problem Relation Age of Onset   Hypertension Mother    COPD Father    Hypertension Father    Stroke Maternal Grandfather     Social History:  reports that he quit smoking about 37 years ago. His smoking use included cigarettes. He has never used smokeless tobacco. He reports that he does not drink alcohol and does not use drugs.   Physical Exam: BP 125/74   Pulse 82   Ht 5\' 8"  (1.727 m)   Wt 246 lb (111.6 kg)   BMI 37.40 kg/m   Constitutional:  Alert and oriented, No acute distress. HEENT:  AT Respiratory: Normal respiratory effort, no increased work of breathing. GI: Abdomen is soft, nontender, nondistended, no abdominal masses GU: Prostate 35 grams, smooth without nodules. Psychiatric: Normal mood and affect.   Urinalysis Dipstick 1+ blood/nitrate positive/3+ leukocytes. Microscopy >30 WBC/3-10 RBC.   Assessment & Plan:    67 year old male with multidrug-resistant ESBL E. coli. This  most likely represents a persistent and not recurrent infection.  PVR today was 103 mL and will start tamsulosin 0.4 mg daily to see if bladder emptying can be improved.  CT abdomen pelvis ordered to evaluate for any anatomic abnormalities.  We discussed urinary tract infections in men are commonly related to prostatitis, which take a longer course of antibiotics to clear, which is complicated with his multidrug-resistant organism and no effective long-term oral antibiotic.  Urine culture was again ordered today and if no definite anatomic abnormality identified, I recommend infectious disease consult for consideration of outpatient IV therapy. Cystoscopy if no significant improvement in bladder emptying on tamsulosin   I have reviewed the above documentation for accuracy and completeness, and I agree with the above.   Riki Altes, MD  [facility] [address] [place], [place] [address] [phone]

## 2023-06-05 LAB — URINALYSIS, COMPLETE
Bilirubin, UA: NEGATIVE
Glucose, UA: NEGATIVE
Ketones, UA: NEGATIVE
Nitrite, UA: POSITIVE — AB
Protein,UA: NEGATIVE
Specific Gravity, UA: 1.015 (ref 1.005–1.030)
Urobilinogen, Ur: 0.2 mg/dL (ref 0.2–1.0)
pH, UA: 7 (ref 5.0–7.5)

## 2023-06-05 LAB — MICROSCOPIC EXAMINATION: WBC, UA: >30 /[HPF] — AB (ref 0–5)

## 2023-06-07 ENCOUNTER — Other Ambulatory Visit: Payer: Self-pay | Admitting: Urology

## 2023-06-07 DIAGNOSIS — N390 Urinary tract infection, site not specified: Secondary | ICD-10-CM

## 2023-06-07 LAB — CULTURE, URINE COMPREHENSIVE

## 2023-06-08 ENCOUNTER — Ambulatory Visit
Admission: RE | Admit: 2023-06-08 | Discharge: 2023-06-08 | Disposition: A | Discharge status: Home / Self Care | Payer: PPO | Source: Ambulatory Visit | Attending: Urology | Admitting: Urology

## 2023-06-08 DIAGNOSIS — R319 Hematuria, unspecified: Secondary | ICD-10-CM | POA: Diagnosis not present

## 2023-06-08 DIAGNOSIS — N281 Cyst of kidney, acquired: Secondary | ICD-10-CM | POA: Diagnosis not present

## 2023-06-08 DIAGNOSIS — R3129 Other microscopic hematuria: Secondary | ICD-10-CM

## 2023-06-18 ENCOUNTER — Other Ambulatory Visit: Payer: Self-pay

## 2023-06-18 MED ORDER — MONTELUKAST SODIUM 10 MG PO TABS
10.0000 mg | ORAL_TABLET | Freq: Every evening | ORAL | 1 refills | Status: DC
  Filled 2023-06-18: qty 90, 90d supply, fill #0
  Filled 2023-09-21: qty 90, 90d supply, fill #1

## 2023-06-19 ENCOUNTER — Inpatient Hospital Stay
Admission: RE | Admit: 2023-06-19 | Discharge: 2023-06-19 | Disposition: A | Discharge status: Home / Self Care | Payer: Self-pay | Source: Ambulatory Visit | Attending: Infectious Diseases | Admitting: Infectious Diseases

## 2023-06-19 ENCOUNTER — Encounter: Payer: Self-pay | Admitting: Infectious Diseases

## 2023-06-19 ENCOUNTER — Ambulatory Visit: Payer: PPO | Attending: Urology | Admitting: Infectious Diseases

## 2023-06-19 ENCOUNTER — Ambulatory Visit
Admission: RE | Admit: 2023-06-19 | Discharge: 2023-06-19 | Disposition: A | Discharge status: Home / Self Care | Payer: PPO | Source: Ambulatory Visit | Attending: Infectious Diseases | Admitting: Infectious Diseases

## 2023-06-19 ENCOUNTER — Telehealth: Payer: Self-pay

## 2023-06-19 VITALS — BP 103/73 | HR 75 | Temp 98.2°F | Resp 16

## 2023-06-19 VITALS — BP 126/74 | HR 80 | Temp 97.4°F | Ht 68.0 in | Wt 255.0 lb

## 2023-06-19 DIAGNOSIS — Z79899 Other long term (current) drug therapy: Secondary | ICD-10-CM | POA: Diagnosis not present

## 2023-06-19 DIAGNOSIS — G4733 Obstructive sleep apnea (adult) (pediatric): Secondary | ICD-10-CM | POA: Insufficient documentation

## 2023-06-19 DIAGNOSIS — B999 Unspecified infectious disease: Secondary | ICD-10-CM | POA: Diagnosis not present

## 2023-06-19 DIAGNOSIS — N390 Urinary tract infection, site not specified: Secondary | ICD-10-CM | POA: Diagnosis not present

## 2023-06-19 DIAGNOSIS — Z1612 Extended spectrum beta lactamase (ESBL) resistance: Secondary | ICD-10-CM

## 2023-06-19 DIAGNOSIS — Z7984 Long term (current) use of oral hypoglycemic drugs: Secondary | ICD-10-CM | POA: Insufficient documentation

## 2023-06-19 DIAGNOSIS — I10 Essential (primary) hypertension: Secondary | ICD-10-CM | POA: Diagnosis not present

## 2023-06-19 DIAGNOSIS — B9620 Unspecified Escherichia coli [E. coli] as the cause of diseases classified elsewhere: Secondary | ICD-10-CM | POA: Insufficient documentation

## 2023-06-19 DIAGNOSIS — E119 Type 2 diabetes mellitus without complications: Secondary | ICD-10-CM | POA: Diagnosis not present

## 2023-06-19 DIAGNOSIS — A498 Other bacterial infections of unspecified site: Secondary | ICD-10-CM

## 2023-06-19 DIAGNOSIS — N3289 Other specified disorders of bladder: Secondary | ICD-10-CM | POA: Diagnosis not present

## 2023-06-19 LAB — CBC WITH DIFFERENTIAL/PLATELET
Abs Immature Granulocytes: 0.02 10*3/uL (ref 0.00–0.07)
Basophils Absolute: 0.1 10*3/uL (ref 0.0–0.1)
Basophils Relative: 1 %
Eosinophils Absolute: 0.7 10*3/uL — ABNORMAL HIGH (ref 0.0–0.5)
Eosinophils Relative: 7 %
HCT: 42.4 % (ref 39.0–52.0)
Hemoglobin: 14.2 g/dL (ref 13.0–17.0)
Immature Granulocytes: 0 %
Lymphocytes Relative: 22 %
Lymphs Abs: 2.3 10*3/uL (ref 0.7–4.0)
MCH: 27.8 pg (ref 26.0–34.0)
MCHC: 33.5 g/dL (ref 30.0–36.0)
MCV: 83.1 fL (ref 80.0–100.0)
Monocytes Absolute: 0.8 10*3/uL (ref 0.1–1.0)
Monocytes Relative: 7 %
Neutro Abs: 6.5 10*3/uL (ref 1.7–7.7)
Neutrophils Relative %: 63 %
Platelets: 238 10*3/uL (ref 150–400)
RBC: 5.1 MIL/uL (ref 4.22–5.81)
RDW: 14.4 % (ref 11.5–15.5)
WBC: 10.3 10*3/uL (ref 4.0–10.5)
nRBC: 0 % (ref 0.0–0.2)

## 2023-06-19 LAB — COMPREHENSIVE METABOLIC PANEL
ALT: 22 U/L (ref 0–44)
AST: 23 U/L (ref 15–41)
Albumin: 3.8 g/dL (ref 3.5–5.0)
Alkaline Phosphatase: 53 U/L (ref 38–126)
Anion gap: 9 (ref 5–15)
BUN: 17 mg/dL (ref 8–23)
CO2: 23 mmol/L (ref 22–32)
Calcium: 8.9 mg/dL (ref 8.9–10.3)
Chloride: 106 mmol/L (ref 98–111)
Creatinine, Ser: 0.93 mg/dL (ref 0.61–1.24)
GFR, Estimated: >60 mL/min (ref >60)
Glucose, Bld: 181 mg/dL — ABNORMAL HIGH (ref 70–99)
Potassium: 3.7 mmol/L (ref 3.5–5.1)
Sodium: 138 mmol/L (ref 135–145)
Total Bilirubin: 0.4 mg/dL (ref 0.3–1.2)
Total Protein: 6.8 g/dL (ref 6.5–8.1)

## 2023-06-19 MED ORDER — CHLORHEXIDINE GLUCONATE CLOTH 2 % EX PADS: 6.0000 | MEDICATED_PAD | Freq: Every day | CUTANEOUS | Status: DC

## 2023-06-19 MED ORDER — ERTAPENEM SODIUM 1 G IJ SOLR
1.0000 g | INTRAMUSCULAR | Status: AC
Start: 2023-06-19 — End: 2023-06-20

## 2023-06-19 MED ORDER — SODIUM CHLORIDE 0.9% FLUSH: 10.0000 mL | INTRAVENOUS | Status: DC | PRN

## 2023-06-19 MED ORDER — SODIUM CHLORIDE 0.9 % IV SOLN
1.0000 g | Freq: Once | INTRAVENOUS | Status: AC
  Administered 2023-06-19: 1 g via INTRAVENOUS
  Filled 2023-06-19: qty 1

## 2023-06-19 MED ORDER — SODIUM CHLORIDE 0.9% FLUSH: 10.0000 mL | Freq: Two times a day (BID) | INTRAVENOUS | Status: DC

## 2023-06-19 NOTE — Progress Notes (Signed)
NAME: Danny Maldonado  DOB: [birth year]  MRN: [id number]  Date/Time: 06/19/2023 10:58 AM   Subjective:  Discussed the use of AI scribe software for clinical note transcription with the patient, who gave verbal consent to proceed.  History of Present Illness          Danny Maldonado, a patient with a history of diabetes, HTN, and OSA, was referred to me by urologist Dr.Stoioiff for  symptoms consistent with a urinary tract infection (UTI) that has been ongoing since late August. The patient reports experiencing sharp pain during urination, which is located more towards the bladder. The pain is sometimes present throughout the entire process of urination. The patient also reports an increased frequency of urination, especially at night, with variations in the volume of urine passed. The patient notes that when a large volume of urine is passed, there is no pain, but when the volume is small, the pain is more intense. The patient has been diagnosed with drug-resistant E. coli bacteria in the urine. The patient also reports a history of incomplete emptying of the bladder.as evidenced by post void scan of 103 in the urologist office. HE was given nitrofurantoin and fosfomycin with no significant improvement He stopped jardince himself because of glucosuria and increased risk for UTI?  Past Medical History:  Diagnosis Date   Actinic keratosis    Allergy    Cancer (HCC)    Diabetes mellitus without complication (HCC)    DJD (degenerative joint disease)    Gastritis    GERD (gastroesophageal reflux disease)    Hyperlipidemia    Sleep apnea    Squamous cell carcinoma of skin 06/08/2010   R temple    Past Surgical History:  Procedure Laterality Date   CARDIAC CATHETERIZATION     CHOLECYSTECTOMY     COLONOSCOPY WITH PROPOFOL N/A 09/26/2017   Procedure: COLONOSCOPY WITH PROPOFOL;  Surgeon: Scot Jun, MD;  Location: [facility] ENDOSCOPY;  Service: Endoscopy;  Laterality: N/A;   HERNIA REPAIR      right trigger thumb release     VASECTOMY      Social History   Socioeconomic History   Marital status: Married    Spouse name: Not on file   Number of children: Not on file   Years of education: Not on file   Highest education level: Not on file  Occupational History   Not on file  Tobacco Use   Smoking status: Former    Current packs/day: 0.00    Types: Cigarettes    Quit date: 12/02/1985    Years since quitting: 37.5   Smokeless tobacco: Never  Substance and Sexual Activity   Alcohol use: No    Alcohol/week: 0.0 standard drinks of alcohol   Drug use: No   Sexual activity: Not on file  Other Topics Concern   Not on file  Social History Narrative   Not on file   Social Determinants of Health   Financial Resource Strain: Low Risk  (03/26/2023)   Received from [facility]   Overall Financial Resource Strain (CARDIA)    Difficulty of Paying Living Expenses: Not hard at all  Food Insecurity: No Food Insecurity (03/26/2023)   Received from [facility]   Hunger Vital Sign    Worried About Running Out of Food in the Last Year: Never true    Ran Out of Food in the Last Year: Never true  Transportation Needs: No Transportation Needs (03/26/2023)   Received from [facility]   PRAPARE - Transportation    In the past 12 months, has lack of transportation kept you from medical appointments or from getting medications?: No    Lack of Transportation (Non-Medical): No  Physical Activity: Not on file  Stress: Not on file  Social Connections: Not on file  Intimate Partner Violence: Not on file    Family History  Problem Relation Age of Onset   Hypertension Mother    COPD Father    Hypertension Father    Stroke Maternal Grandfather    No Known Allergies I? Current Outpatient Medications  Medication Sig Dispense Refill   acetaminophen (TYLENOL) 500 MG tablet Take 1,000 mg by mouth every 6 (six) hours as needed (PRN - takes  approximately once per week).     albuterol (ACCUNEB) 1.25 MG/3ML nebulizer solution Take 3 mLs (1.25 mg total) by nebulization every 6 (six) hours as needed for Wheezing 75 mL 2   albuterol (VENTOLIN HFA) 108 (90 Base) MCG/ACT inhaler Inhale 2 puffs into the lungs every 6 (six) hours as needed for wheezing. 18 g 2   atorvastatin (LIPITOR) 20 MG tablet Take 1 tablet (20 mg total) by mouth once daily 90 tablet 3   bisoprolol-hydrochlorothiazide (ZIAC) 5-6.25 MG tablet Take 1 tablet by mouth daily. 90 tablet 3   Blood Glucose Monitoring Suppl (ONE TOUCH ULTRA 2) w/Device KIT Use as instructed. 1 kit 0   budesonide (PULMICORT) 0.5 MG/2ML nebulizer solution USE 2 MILLILITERS (ML) BY NEBULIZATION 2 TIMES DAILY 120 mL 5   Cyanocobalamin 500 MCG SUBL Place 1,000 mcg under the tongue daily.     empagliflozin (JARDIANCE) 25 MG TABS tablet Take 1 tablet (25 mg total) by mouth once daily 30 tablet 11   gabapentin (NEURONTIN) 300 MG capsule Take 1 capsule (300 mg total) by mouth 2 (two) times daily. 60 capsule 11   glimepiride (AMARYL) 2 MG tablet Take 1 tablet (2 mg total) by mouth daily with breakfast. 90 tablet 1   glucose blood (ONETOUCH ULTRA TEST) test strip 1 each by Other route 2 (two) times daily. 200 each 3   ipratropium-albuterol (DUONEB) 0.5-2.5 (3) MG/3ML SOLN Take 3 mLs by nebulization 4 (four) times daily as needed for Wheezing 360 mL 8   magnesium oxide (MAG-OX) 400 MG tablet Take 400 mg by mouth daily.     metFORMIN (GLUCOPHAGE) 1000 MG tablet Take 1 tablet (1,000 mg total) by mouth 2 (two) times daily with a meal. 180 tablet 3   montelukast (SINGULAIR) 10 MG tablet Take 1 tablet (10 mg total) by mouth at bedtime. 90 tablet 1   Multiple Vitamin (MULTIVITAMIN) tablet Take 1 tablet by mouth daily.     OneTouch Delica Lancets 33G MISC 1 each by Other route 2 (two) times daily. 200 each 3   pantoprazole (PROTONIX) 40 MG tablet Take 1 tablet (40 mg total) by mouth daily. 90 tablet 5    Semaglutide,0.25 or 0.5MG /DOS, (OZEMPIC, 0.25 OR 0.5 MG/DOSE,) 2 MG/3ML SOPN Inject 0.5 mg into the skin once a week. 12 mL 3   tamsulosin (FLOMAX) 0.4 MG CAPS capsule Take 1 capsule (0.4 mg total) by mouth daily. 30 capsule 0   traMADol (ULTRAM) 50 MG tablet Take 1 tablet (50 mg total) by mouth every 6 (six) hours as needed. 12 tablet 0   No current facility-administered medications for this visit.     Abtx:  Anti-infectives (From admission, onward)    None  REVIEW OF SYSTEMS:  Const: negative fever, negative chills, weight loss of > 40 pounds with ozempic Eyes: negative diplopia or visual changes, negative eye pain ENT: negative coryza, negative sore throat Resp: negative cough, hemoptysis, dyspnea Cards: negative for chest pain, palpitations, lower extremity edema GU: as above GI: Negative for abdominal pain, diarrhea, bleeding, constipation Skin: negative for rash and pruritus Heme: negative for easy bruising and gum/nose bleeding MS: negative for myalgias, arthralgias, back pain and muscle weakness Neurolo:negative for headaches, dizziness, vertigo, memory problems  Psych: negative for feelings of anxiety, depression  Endocrine:  diabetes Allergy/Immunology- negative for any medication or food allergies ?  Objective:  VITALS:  BP 126/74   Pulse 80   Temp (!) 97.4 F (36.3 C) (Temporal)   Ht 5\' 8"  (1.727 m)   Wt 255 lb (115.7 kg)   BMI 38.77 kg/m   PHYSICAL EXAM:  General: Alert, cooperative, no distress, appears stated age.  Head: Normocephalic, without obvious abnormality, atraumatic. Eyes: Conjunctivae clear, anicteric sclerae. Pupils are equal ENT Nares normal. No drainage or sinus tenderness. Lips, mucosa, and tongue normal. No Thrush Neck: Supple, symmetrical, no adenopathy, thyroid: non tender no carotid bruit and no JVD. Back: No CVA tenderness. Lungs: Clear to auscultation bilaterally. No Wheezing or Rhonchi. No rales. Heart: Regular rate and  rhythm, no murmur, rub or gallop. Abdomen: Soft, non-tender,not distended. Bowel sounds normal. No masses Extremities: atraumatic, no cyanosis. No edema. No clubbing Skin: No rashes or lesions. Or bruising Lymph: Cervical, supraclavicular normal. Neurologic: Grossly non-focal Pertinent Labs Urine culture 10/7- ESBl Ecoli  IMAGING RESULTS: CT abdomen from RADIOLOGY CT scan: No kidney stone, no hydronephrosis, small cyst, pelvis not imaged (06/08/2023) ? Impression/Recommendation  Urinary Tract Infection due to ESBL Ecoli Persistent symptoms of dysuria and urinary urgency since late August. Urine cultures have repeatedly grown drug-resistant E. coli. No signs of prostatitis on physical examination as per urologist- no prostate imaging done. -Start intravenous Ertapenem for 2 weeks, with potential extension to 4 weeks if necessary after discussion with Dr. Lonna Cobb and possible imaging of the prostate and bladder. -Check blood weekly while on antibiotics. -Advise to maintain good perineal hygiene, especially after bowel movements.  LUTS Symptoms suggestive of bladder irritation, possibly secondary to prostate enlargement. Currently on Tamsulosin 0.4mg  daily since October 8th. -Continue Tamsulosin 0.4mg  daily. -Consider further evaluation if symptoms persist after resolution of UTI.  DM_ on metformin and sulfonyl urea Also on ozempic  HTN on Bisoprolol hydrochlorothiazide    General Health Maintenance -Advise to get flu and COVID vaccines. -Advise to maintain regular bowel movements to reduce risk of UTI. -Advise to maintain good hydration, avoid artificial sweeteners and acidic juices which can irritate the bladder.? ? Diagnosis: ESBL ecoli UTI Baseline Creatinine <1   No Known Allergies  OPAT Orders Discharge antibiotics: Ertapenem 1 gram IV every 24 hrs Duration: 2 weeks ( may need more) End Date: 07/03/23  [facility] Care Per Protocol: Place biopatch  Labs weekly while  on IV antibiotics: _X_ CBC with differential __ BMP _X_ CMP    _X_ Please leave PIC in place until doctor has seen patient or been notified  Fax weekly lab results  promptly to [phone]  Clinic Follow Up Appt: 07/03/23    Call [phone] with any critical values or questions  Pt is getting PICC line today  ? I have personally spent  -60--minutes involved in face-to-face and non-face-to-face activities for this patient on the day of the visit. Professional time spent includes the  following activities: Preparing to see the patient (review of tests), Obtaining and/or reviewing separately obtained history (admission/discharge record), Performing a medically appropriate examination and/or evaluation , Ordering medications/tests/procedures, referring and communicating with other health care professionals, Documenting clinical information in the EMR, Independently interpreting results (not separately reported), Communicating results to the patient/family/caregiver, Counseling and educating the patient and Care coordination with urologist, Day surgery and Infusion company (not separately reported).    ________________________________________________ Discussed with patient, requesting provider Note:  This document was prepared using Dragon voice recognition software and may include unintentional dictation errors.

## 2023-06-19 NOTE — Progress Notes (Signed)
PICC line is ready to use , placement confirmed by ECG witness by 2 PICC RN's Conrad [place] RN and Reginia Forts RN.

## 2023-06-19 NOTE — Telephone Encounter (Signed)
Patient will be having picc line placed today at [facility] short stay.  Patient will get first dose at short stay as well.  IV ertapenem 1 gram. I have updated Pam with [employer] of patient picc line appointment and first dose at short stay.  Pam will reach out to [facility] to get them out to the patient's home tomorrow.  Patient aware of all information.  Greg Eckrich Jonathon Resides, CMA

## 2023-06-19 NOTE — Patient Instructions (Signed)
Dear Mr. Riegel, during your recent visit, we discussed your ongoing symptoms of a urinary tract infection (UTI) and bladder irritation. We have identified drug-resistant E. coli bacteria in your urine, which is likely causing your UTI. We also discussed your general health maintenance, including the importance of getting your flu and COVID vaccines, maintaining regular bowel movements, and staying well-hydrated.  YOUR PLAN:  -RECURRENT URINARY TRACT INFECTIONS: You have been experiencing frequent UTIs, likely due to drug-resistant E. coli bacteria. We will start you on an intravenous antibiotic called Ertapenem for 2 weeks, which may be extended to 4 weeks if necessary. We will also monitor your blood weekly while you are on these antibiotics. It's important to maintain good hygiene, especially after bowel movements, to help prevent UTIs.  -LOWER URINARY TRACT SYMPTOMS: Your symptoms suggest bladder irritation, possibly due to an enlarged prostate. You should continue taking Tamsulosin 0.4mg  daily, which helps to relax the muscles in the prostate and bladder. If your symptoms persist after your UTI is resolved, we may need to evaluate further.  -GENERAL HEALTH MAINTENANCE: For your overall health, it's important to get your flu and COVID vaccines. Regular bowel movements can help reduce the risk of UTIs. Also, staying well-hydrated is important, but try to avoid artificial sweeteners and acidic juices, as they can irritate the bladder.  INSTRUCTIONS:  Please ensure to get your flu and COVID vaccines as soon as possible. Maintain regular bowel movements and good hydration. Avoid artificial sweeteners and acidic juices. Continue taking Tamsulosin 0.4mg  daily. We will start you on Ertapenem for your UTI, and monitor your blood weekly while you are on this medication. If your symptoms persist, please contact us for further evaluation

## 2023-06-19 NOTE — Progress Notes (Signed)
Peripherally Inserted Central Catheter Placement  The IV Nurse has discussed with the patient and/or persons authorized to consent for the patient, the purpose of this procedure and the potential benefits and risks involved with this procedure.  The benefits include less needle sticks, lab draws from the catheter, and the patient may be discharged home with the catheter. Risks include, but not limited to, infection, bleeding, blood clot (thrombus formation), and puncture of an artery; nerve damage and irregular heartbeat and possibility to perform a PICC exchange if needed/ordered by physician.  Alternatives to this procedure were also discussed.  Bard Power PICC patient education guide, fact sheet on infection prevention and patient information card has been provided to patient /or left at bedside.    PICC Placement Documentation  PICC Single Lumen 06/19/23 Right Basilic 43 cm 0 cm (Active)  Indication for Insertion or Continuance of Line Home intravenous therapies (PICC only) 06/19/23 1557  Exposed Catheter (cm) 0 cm 06/19/23 1557  Site Assessment Clean, Dry, Intact 06/19/23 1557  Line Status Flushed;Saline locked;Blood return noted 06/19/23 1557  Dressing Type Transparent;Securing device 06/19/23 1557  Dressing Status Antimicrobial disc in place;Clean, Dry, Intact 06/19/23 1557  Line Adjustment (NICU/IV Team Only) No 06/19/23 1557  Dressing Intervention New dressing;Adhesive placed at insertion site (IV team only);Other (Comment) 06/19/23 1557  Dressing Change Due 06/26/23 06/19/23 1557       Reginia Forts Albarece 06/19/2023, 3:58 PM

## 2023-06-19 NOTE — Telephone Encounter (Signed)
OPAT Orders Discharge antibiotics: Ertapenem 1 gram IV every 24 hrs Duration: 2 weeks ( may need more) End Date: 07/03/23   [facility] Care Per Protocol: Place biopatch   Labs weekly while on IV antibiotics: _X_ CBC with differential __ BMP _X_ CMP       _X_ Please leave PIC in place until doctor has seen patient or been notified   Fax weekly lab results  promptly to [phone]

## 2023-06-20 ENCOUNTER — Encounter: Payer: Self-pay | Admitting: *Deleted

## 2023-06-20 ENCOUNTER — Other Ambulatory Visit: Payer: Self-pay | Admitting: *Deleted

## 2023-06-20 DIAGNOSIS — E785 Hyperlipidemia, unspecified: Secondary | ICD-10-CM | POA: Diagnosis not present

## 2023-06-20 DIAGNOSIS — Z792 Long term (current) use of antibiotics: Secondary | ICD-10-CM | POA: Diagnosis not present

## 2023-06-20 DIAGNOSIS — E119 Type 2 diabetes mellitus without complications: Secondary | ICD-10-CM | POA: Diagnosis not present

## 2023-06-20 DIAGNOSIS — Z7951 Long term (current) use of inhaled steroids: Secondary | ICD-10-CM | POA: Diagnosis not present

## 2023-06-20 DIAGNOSIS — Z87891 Personal history of nicotine dependence: Secondary | ICD-10-CM | POA: Diagnosis not present

## 2023-06-20 DIAGNOSIS — Z7985 Long-term (current) use of injectable non-insulin antidiabetic drugs: Secondary | ICD-10-CM | POA: Diagnosis not present

## 2023-06-20 DIAGNOSIS — N412 Abscess of prostate: Secondary | ICD-10-CM

## 2023-06-20 DIAGNOSIS — I10 Essential (primary) hypertension: Secondary | ICD-10-CM | POA: Diagnosis not present

## 2023-06-20 DIAGNOSIS — Z22358 Carrier of other enterobacterales: Secondary | ICD-10-CM | POA: Diagnosis not present

## 2023-06-20 DIAGNOSIS — G4733 Obstructive sleep apnea (adult) (pediatric): Secondary | ICD-10-CM | POA: Diagnosis not present

## 2023-06-20 DIAGNOSIS — Z85828 Personal history of other malignant neoplasm of skin: Secondary | ICD-10-CM | POA: Diagnosis not present

## 2023-06-20 DIAGNOSIS — Z452 Encounter for adjustment and management of vascular access device: Secondary | ICD-10-CM | POA: Diagnosis not present

## 2023-06-20 DIAGNOSIS — B9620 Unspecified Escherichia coli [E. coli] as the cause of diseases classified elsewhere: Secondary | ICD-10-CM | POA: Diagnosis not present

## 2023-06-20 DIAGNOSIS — Z9181 History of falling: Secondary | ICD-10-CM | POA: Diagnosis not present

## 2023-06-20 DIAGNOSIS — Z7984 Long term (current) use of oral hypoglycemic drugs: Secondary | ICD-10-CM | POA: Diagnosis not present

## 2023-06-20 DIAGNOSIS — N390 Urinary tract infection, site not specified: Secondary | ICD-10-CM | POA: Diagnosis not present

## 2023-06-20 DIAGNOSIS — K2970 Gastritis, unspecified, without bleeding: Secondary | ICD-10-CM | POA: Diagnosis not present

## 2023-06-26 ENCOUNTER — Other Ambulatory Visit: Payer: Self-pay | Admitting: Urology

## 2023-06-26 DIAGNOSIS — N390 Urinary tract infection, site not specified: Secondary | ICD-10-CM | POA: Diagnosis not present

## 2023-06-27 ENCOUNTER — Other Ambulatory Visit: Payer: Self-pay

## 2023-06-27 MED ORDER — COMIRNATY 30 MCG/0.3ML IM SUSY
0.3000 mL | PREFILLED_SYRINGE | Freq: Once | INTRAMUSCULAR | 0 refills | Status: AC
  Filled 2023-06-27: qty 0.3, 1d supply, fill #0

## 2023-06-27 MED ORDER — FLUAD 0.5 ML IM SUSY
0.5000 mL | PREFILLED_SYRINGE | Freq: Once | INTRAMUSCULAR | 0 refills | Status: AC
  Filled 2023-06-27: qty 0.5, 1d supply, fill #0

## 2023-06-28 ENCOUNTER — Encounter: Payer: Self-pay | Admitting: Internal Medicine

## 2023-06-28 ENCOUNTER — Other Ambulatory Visit: Payer: Self-pay

## 2023-06-29 ENCOUNTER — Ambulatory Visit
Admission: RE | Admit: 2023-06-29 | Discharge: 2023-06-29 | Disposition: A | Discharge status: Home / Self Care | Payer: PPO | Source: Ambulatory Visit | Attending: Urology | Admitting: Urology

## 2023-06-29 DIAGNOSIS — N400 Enlarged prostate without lower urinary tract symptoms: Secondary | ICD-10-CM | POA: Insufficient documentation

## 2023-06-29 DIAGNOSIS — Z87440 Personal history of urinary (tract) infections: Secondary | ICD-10-CM | POA: Diagnosis present

## 2023-06-29 DIAGNOSIS — N412 Abscess of prostate: Secondary | ICD-10-CM | POA: Diagnosis present

## 2023-06-29 DIAGNOSIS — N419 Inflammatory disease of prostate, unspecified: Secondary | ICD-10-CM | POA: Diagnosis present

## 2023-06-29 DIAGNOSIS — K5730 Diverticulosis of large intestine without perforation or abscess without bleeding: Secondary | ICD-10-CM | POA: Insufficient documentation

## 2023-06-29 MED ORDER — IOHEXOL 300 MG/ML  SOLN
100.0000 mL | Freq: Once | INTRAMUSCULAR | Status: AC | PRN
  Administered 2023-06-29: 100 mL via INTRAVENOUS

## 2023-07-03 ENCOUNTER — Ambulatory Visit: Payer: PPO | Attending: Infectious Diseases | Admitting: Infectious Diseases

## 2023-07-03 ENCOUNTER — Encounter: Payer: Self-pay | Admitting: Infectious Diseases

## 2023-07-03 ENCOUNTER — Telehealth: Payer: Self-pay

## 2023-07-03 VITALS — BP 116/76 | HR 72 | Temp 97.3°F | Ht 68.0 in | Wt 260.0 lb

## 2023-07-03 DIAGNOSIS — N390 Urinary tract infection, site not specified: Secondary | ICD-10-CM | POA: Diagnosis not present

## 2023-07-03 DIAGNOSIS — Z1612 Extended spectrum beta lactamase (ESBL) resistance: Secondary | ICD-10-CM | POA: Diagnosis not present

## 2023-07-03 DIAGNOSIS — E119 Type 2 diabetes mellitus without complications: Secondary | ICD-10-CM | POA: Diagnosis not present

## 2023-07-03 DIAGNOSIS — Z7984 Long term (current) use of oral hypoglycemic drugs: Secondary | ICD-10-CM | POA: Insufficient documentation

## 2023-07-03 DIAGNOSIS — I10 Essential (primary) hypertension: Secondary | ICD-10-CM | POA: Diagnosis not present

## 2023-07-03 DIAGNOSIS — Z87440 Personal history of urinary (tract) infections: Secondary | ICD-10-CM | POA: Diagnosis not present

## 2023-07-03 DIAGNOSIS — Z79899 Other long term (current) drug therapy: Secondary | ICD-10-CM | POA: Insufficient documentation

## 2023-07-03 DIAGNOSIS — A498 Other bacterial infections of unspecified site: Secondary | ICD-10-CM | POA: Diagnosis not present

## 2023-07-03 DIAGNOSIS — Z7985 Long-term (current) use of injectable non-insulin antidiabetic drugs: Secondary | ICD-10-CM | POA: Diagnosis not present

## 2023-07-03 DIAGNOSIS — B9623 Unspecified Shiga toxin-producing Escherichia coli [E. coli] (STEC) as the cause of diseases classified elsewhere: Secondary | ICD-10-CM

## 2023-07-03 NOTE — Progress Notes (Unsigned)
NAME: Danny Maldonado  DOB: [birth year]  MRN: [id number]  Date/Time: 07/03/2023 11:46 AM   Subjective:  Discussed the use of AI scribe software for clinical note transcription with the patient, who gave verbal consent to proceed. Here for follow up  Last seen  History of Present Illness          Danny Maldonado, a patient with a history of diabetes, HTN, and OSA, was referred to me by urologist Dr.Stoioiff for  symptoms consistent with a urinary tract infection (UTI) that has been ongoing since late August. The patient reports experiencing sharp pain during urination, which is located more towards the bladder. The pain is sometimes present throughout the entire process of urination. The patient also reports an increased frequency of urination, especially at night, with variations in the volume of urine passed. The patient notes that when a large volume of urine is passed, there is no pain, but when the volume is small, the pain is more intense. The patient has been diagnosed with drug-resistant E. coli bacteria in the urine. The patient also reports a history of incomplete emptying of the bladder.as evidenced by post void scan of 103 in the urologist office. HE was given nitrofurantoin and fosfomycin with no significant improvement He stopped jardince himself because of glucosuria and increased risk for UTI?  Past Medical History:  Diagnosis Date   Actinic keratosis    Allergy    Atherosclerosis of abdominal aorta (HCC)    Cancer (HCC)    Coronary artery disease    Diabetes mellitus without complication (HCC)    DJD (degenerative joint disease)    Gastritis    GERD (gastroesophageal reflux disease)    Hx of adenomatous colonic polyps    Hyperlipidemia    Hypertension    Seasonal allergies    Sleep apnea    Squamous cell carcinoma of skin 06/08/2010   R temple    Past Surgical History:  Procedure Laterality Date   CARDIAC CATHETERIZATION     CHOLECYSTECTOMY     COLONOSCOPY WITH PROPOFOL  N/A 09/26/2017   Procedure: COLONOSCOPY WITH PROPOFOL;  Surgeon: Scot Jun, MD;  Location: [facility] ENDOSCOPY;  Service: Endoscopy;  Laterality: N/A;   Dental implant     ESOPHAGOGASTRODUODENOSCOPY     HERNIA REPAIR     right trigger thumb release     VASECTOMY      Social History   Socioeconomic History   Marital status: Married    Spouse name: Not on file   Number of children: Not on file   Years of education: Not on file   Highest education level: Not on file  Occupational History   Not on file  Tobacco Use   Smoking status: Former    Current packs/day: 0.00    Types: Cigarettes    Quit date: 12/02/1985    Years since quitting: 37.6    Passive exposure: Past   Smokeless tobacco: Never  Vaping Use   Vaping status: Never Used  Substance and Sexual Activity   Alcohol use: No    Alcohol/week: 0.0 standard drinks of alcohol   Drug use: No   Sexual activity: Not on file  Other Topics Concern   Not on file  Social History Narrative   Not on file   Social Determinants of Health   Financial Resource Strain: Low Risk  (03/26/2023)   Received from [facility]   Overall Financial Resource Strain (CARDIA)    Difficulty of Paying Living Expenses:  Not hard at all  Food Insecurity: No Food Insecurity (03/26/2023)   Received from [facility]   Hunger Vital Sign    Worried About Running Out of Food in the Last Year: Never true    Ran Out of Food in the Last Year: Never true  Transportation Needs: No Transportation Needs (03/26/2023)   Received from [facility] - Transportation    In the past 12 months, has lack of transportation kept you from medical appointments or from getting medications?: No    Lack of Transportation (Non-Medical): No  Physical Activity: Not on file  Stress: Not on file  Social Connections: Not on file  Intimate Partner Violence: Not on file    Family History  Problem Relation Age of Onset    Hypertension Mother    COPD Father    Hypertension Father    Stroke Maternal Grandfather    No Known Allergies I? Current Outpatient Medications  Medication Sig Dispense Refill   acetaminophen (TYLENOL) 500 MG tablet Take 1,000 mg by mouth every 6 (six) hours as needed (PRN - takes approximately once per week).     albuterol (ACCUNEB) 1.25 MG/3ML nebulizer solution Take 3 mLs (1.25 mg total) by nebulization every 6 (six) hours as needed for Wheezing 75 mL 2   albuterol (VENTOLIN HFA) 108 (90 Base) MCG/ACT inhaler Inhale 2 puffs into the lungs every 6 (six) hours as needed for wheezing. 18 g 2   atorvastatin (LIPITOR) 20 MG tablet Take 1 tablet (20 mg total) by mouth once daily 90 tablet 3   bisoprolol-hydrochlorothiazide (ZIAC) 5-6.25 MG tablet Take 1 tablet by mouth daily. 90 tablet 3   Blood Glucose Monitoring Suppl (ONE TOUCH ULTRA 2) w/Device KIT Use as instructed. 1 kit 0   budesonide (PULMICORT) 0.5 MG/2ML nebulizer solution USE 2 MILLILITERS (ML) BY NEBULIZATION 2 TIMES DAILY 120 mL 5   Cyanocobalamin 500 MCG SUBL Place 1,000 mcg under the tongue daily.     empagliflozin (JARDIANCE) 25 MG TABS tablet Take 1 tablet (25 mg total) by mouth once daily 30 tablet 11   ertapenem (INVANZ) 1 g injection      gabapentin (NEURONTIN) 300 MG capsule Take 1 capsule (300 mg total) by mouth 2 (two) times daily. 60 capsule 11   glimepiride (AMARYL) 2 MG tablet Take 1 tablet (2 mg total) by mouth daily with breakfast. 90 tablet 1   glucose blood (ONETOUCH ULTRA TEST) test strip 1 each by Other route 2 (two) times daily. 200 each 3   ipratropium-albuterol (DUONEB) 0.5-2.5 (3) MG/3ML SOLN Take 3 mLs by nebulization 4 (four) times daily as needed for Wheezing 360 mL 8   loratadine (CLARITIN) 10 MG tablet Take 10 mg by mouth daily.     magnesium oxide (MAG-OX) 400 MG tablet Take 400 mg by mouth daily.     metFORMIN (GLUCOPHAGE) 1000 MG tablet Take 1 tablet (1,000 mg total) by mouth 2 (two) times daily  with a meal. 180 tablet 3   montelukast (SINGULAIR) 10 MG tablet Take 1 tablet (10 mg total) by mouth at bedtime. 90 tablet 1   Multiple Vitamin (MULTIVITAMIN) tablet Take 1 tablet by mouth daily.     OneTouch Delica Lancets 33G MISC 1 each by Other route 2 (two) times daily. 200 each 3   pantoprazole (PROTONIX) 40 MG tablet Take 1 tablet (40 mg total) by mouth daily. 90 tablet 5   Semaglutide,0.25 or 0.5MG /DOS, (OZEMPIC, 0.25 OR 0.5  MG/DOSE,) 2 MG/3ML SOPN Inject 0.5 mg into the skin once a week. 12 mL 3   tamsulosin (FLOMAX) 0.4 MG CAPS capsule TAKE 1 CAPSULE BY MOUTH EVERY DAY 90 capsule 1   traMADol (ULTRAM) 50 MG tablet Take 1 tablet (50 mg total) by mouth every 6 (six) hours as needed. 12 tablet 0   No current facility-administered medications for this visit.     Abtx:  Anti-infectives (From admission, onward)    None       REVIEW OF SYSTEMS:  Const: negative fever, negative chills, weight loss of > 40 pounds with ozempic Eyes: negative diplopia or visual changes, negative eye pain ENT: negative coryza, negative sore throat Resp: negative cough, hemoptysis, dyspnea Cards: negative for chest pain, palpitations, lower extremity edema GU: as above GI: Negative for abdominal pain, diarrhea, bleeding, constipation Skin: negative for rash and pruritus Heme: negative for easy bruising and gum/nose bleeding MS: negative for myalgias, arthralgias, back pain and muscle weakness Neurolo:negative for headaches, dizziness, vertigo, memory problems  Psych: negative for feelings of anxiety, depression  Endocrine:  diabetes Allergy/Immunology- negative for any medication or food allergies ?  Objective:  VITALS:  BP 116/76   Pulse 72   Temp (!) 97.3 F (36.3 C) (Temporal)   Ht 5\' 8"  (1.727 m)   Wt 260 lb (117.9 kg)   BMI 39.53 kg/m   PHYSICAL EXAM:  General: Alert, cooperative, no distress, appears stated age.  Head: Normocephalic, without obvious abnormality,  atraumatic. Eyes: Conjunctivae clear, anicteric sclerae. Pupils are equal ENT Nares normal. No drainage or sinus tenderness. Lips, mucosa, and tongue normal. No Thrush Neck: Supple, symmetrical, no adenopathy, thyroid: non tender no carotid bruit and no JVD. Back: No CVA tenderness. Lungs: Clear to auscultation bilaterally. No Wheezing or Rhonchi. No rales. Heart: Regular rate and rhythm, no murmur, rub or gallop. Abdomen: Soft, non-tender,not distended. Bowel sounds normal. No masses Extremities: atraumatic, no cyanosis. No edema. No clubbing Skin: No rashes or lesions. Or bruising Lymph: Cervical, supraclavicular normal. Neurologic: Grossly non-focal Pertinent Labs Urine culture 10/7- ESBl Ecoli  IMAGING RESULTS: CT abdomen from RADIOLOGY CT scan: No kidney stone, no hydronephrosis, small cyst, pelvis not imaged (06/08/2023) ? Impression/Recommendation  Urinary Tract Infection due to ESBL Ecoli Persistent symptoms of dysuria and urinary urgency since late August. Urine cultures have repeatedly grown drug-resistant E. coli. No signs of prostatitis on physical examination as per urologist- no prostate imaging done. -Start intravenous Ertapenem for 2 weeks, with potential extension to 4 weeks if necessary after discussion with Dr. Lonna Cobb and possible imaging of the prostate and bladder. -Check blood weekly while on antibiotics. -Advise to maintain good perineal hygiene, especially after bowel movements.  LUTS Symptoms suggestive of bladder irritation, possibly secondary to prostate enlargement. Currently on Tamsulosin 0.4mg  daily since October 8th. -Continue Tamsulosin 0.4mg  daily. -Consider further evaluation if symptoms persist after resolution of UTI.  DM_ on metformin and sulfonyl urea Also on ozempic  HTN on Bisoprolol hydrochlorothiazide    General Health Maintenance -Advise to get flu and COVID vaccines. -Advise to maintain regular bowel movements to reduce risk of  UTI. -Advise to maintain good hydration, avoid artificial sweeteners and acidic juices which can irritate the bladder.? ? Diagnosis: ESBL ecoli UTI Baseline Creatinine <1   No Known Allergies  OPAT Orders Discharge antibiotics: Ertapenem 1 gram IV every 24 hrs Duration: 2 weeks ( may need more) End Date: 07/03/23  [facility] Care Per Protocol: Place biopatch  Labs weekly while on IV antibiotics:  _X_ CBC with differential __ BMP _X_ CMP    _X_ Please leave PIC in place until doctor has seen patient or been notified  Fax weekly lab results  promptly to [phone]  Clinic Follow Up Appt: 07/03/23    Call [phone] with any critical values or questions  Pt is getting PICC line today  ? I have personally spent  -60--minutes involved in face-to-face and non-face-to-face activities for this patient on the day of the visit. Professional time spent includes the following activities: Preparing to see the patient (review of tests), Obtaining and/or reviewing separately obtained history (admission/discharge record), Performing a medically appropriate examination and/or evaluation , Ordering medications/tests/procedures, referring and communicating with other health care professionals, Documenting clinical information in the EMR, Independently interpreting results (not separately reported), Communicating results to the patient/family/caregiver, Counseling and educating the patient and Care coordination with urologist, Day surgery and Infusion company (not separately reported).    ________________________________________________ Discussed with patient, requesting provider Note:  This document was prepared using Dragon voice recognition software and may include unintentional dictation errors.

## 2023-07-03 NOTE — Patient Instructions (Signed)
VISIT SUMMARY:  During today's visit, we discussed your recent urinary symptoms and the significant improvement you've experienced with antibiotic therapy. You reported no pain during urination, decreased frequency, and no urgency. Additionally, you mentioned no side effects from the medication and some localized itching around the dressing site. We also reviewed your ongoing home nursing care.  YOUR PLAN:  -you may have PROSTATITIS: Prostatitis is an inflammation of the prostate gland. Your urinary symptoms have significantly improved with antibiotic therapy. We are awaiting the results of your CT scan to determine if there is any remaining prostate inflammation. If the scan shows inflammation, you will need to continue antibiotics for an additional two weeks. If not, you can stop the antibiotics. We will continue to monitor for any adverse effects from the medication.  -GENERAL HEALTH MAINTENANCE: You will continue with weekly nursing visits for blood checks and dressing changes. This will help ensure that your overall health is monitored and that any issues are addressed promptly.  INSTRUCTIONS:  We will  follow up for the results of your CT scan and let you know and your pharmacy. Continue with your current schedule of weekly nursing visits for blood checks and dressing changes.

## 2023-07-03 NOTE — Telephone Encounter (Signed)
I have updated Pam with [employer] to let her know per Dr. Rivka Safer patient's picc line can be removed.  Roc Streett Jonathon Resides, CMA

## 2023-07-07 ENCOUNTER — Other Ambulatory Visit: Payer: Self-pay

## 2023-07-09 ENCOUNTER — Other Ambulatory Visit: Payer: Self-pay

## 2023-07-09 ENCOUNTER — Encounter: Payer: Self-pay | Admitting: Internal Medicine

## 2023-07-09 MED ORDER — IPRATROPIUM-ALBUTEROL 0.5-2.5 (3) MG/3ML IN SOLN
3.0000 mL | Freq: Four times a day (QID) | RESPIRATORY_TRACT | 8 refills | Status: AC
  Filled 2023-07-09: qty 360, 30d supply, fill #0
  Filled 2023-10-08: qty 360, 30d supply, fill #1
  Filled 2024-02-12: qty 360, 30d supply, fill #2

## 2023-07-10 ENCOUNTER — Encounter: Payer: Self-pay | Admitting: Internal Medicine

## 2023-07-10 ENCOUNTER — Ambulatory Visit: Payer: PPO | Admitting: Certified Registered Nurse Anesthetist

## 2023-07-10 ENCOUNTER — Ambulatory Visit
Admission: RE | Admit: 2023-07-10 | Discharge: 2023-07-10 | Disposition: A | Discharge status: Home / Self Care | Payer: PPO | Attending: Internal Medicine | Admitting: Internal Medicine

## 2023-07-10 ENCOUNTER — Encounter
Admission: RE | Disposition: A | Discharge status: Home / Self Care | Payer: Self-pay | Source: Home / Self Care | Attending: Internal Medicine

## 2023-07-10 ENCOUNTER — Other Ambulatory Visit: Payer: Self-pay

## 2023-07-10 DIAGNOSIS — I2510 Atherosclerotic heart disease of native coronary artery without angina pectoris: Secondary | ICD-10-CM | POA: Insufficient documentation

## 2023-07-10 DIAGNOSIS — Z860101 Personal history of adenomatous and serrated colon polyps: Secondary | ICD-10-CM | POA: Diagnosis not present

## 2023-07-10 DIAGNOSIS — K640 First degree hemorrhoids: Secondary | ICD-10-CM | POA: Insufficient documentation

## 2023-07-10 DIAGNOSIS — I700 Atherosclerosis of aorta: Secondary | ICD-10-CM | POA: Insufficient documentation

## 2023-07-10 DIAGNOSIS — Z87891 Personal history of nicotine dependence: Secondary | ICD-10-CM | POA: Insufficient documentation

## 2023-07-10 DIAGNOSIS — Z85828 Personal history of other malignant neoplasm of skin: Secondary | ICD-10-CM | POA: Insufficient documentation

## 2023-07-10 DIAGNOSIS — K219 Gastro-esophageal reflux disease without esophagitis: Secondary | ICD-10-CM | POA: Diagnosis not present

## 2023-07-10 DIAGNOSIS — G4730 Sleep apnea, unspecified: Secondary | ICD-10-CM | POA: Diagnosis not present

## 2023-07-10 DIAGNOSIS — D122 Benign neoplasm of ascending colon: Secondary | ICD-10-CM | POA: Diagnosis not present

## 2023-07-10 DIAGNOSIS — E119 Type 2 diabetes mellitus without complications: Secondary | ICD-10-CM | POA: Insufficient documentation

## 2023-07-10 DIAGNOSIS — Z1211 Encounter for screening for malignant neoplasm of colon: Secondary | ICD-10-CM | POA: Diagnosis not present

## 2023-07-10 DIAGNOSIS — I10 Essential (primary) hypertension: Secondary | ICD-10-CM | POA: Diagnosis not present

## 2023-07-10 DIAGNOSIS — K635 Polyp of colon: Secondary | ICD-10-CM | POA: Diagnosis not present

## 2023-07-10 DIAGNOSIS — K648 Other hemorrhoids: Secondary | ICD-10-CM | POA: Diagnosis not present

## 2023-07-10 HISTORY — DX: Other seasonal allergic rhinitis: J30.2

## 2023-07-10 HISTORY — PX: ESOPHAGOGASTRODUODENOSCOPY (EGD) WITH PROPOFOL: SHX5813

## 2023-07-10 HISTORY — DX: Atherosclerosis of aorta: I70.0

## 2023-07-10 HISTORY — PX: COLONOSCOPY WITH PROPOFOL: SHX5780

## 2023-07-10 HISTORY — PX: POLYPECTOMY: SHX5525

## 2023-07-10 HISTORY — DX: Essential (primary) hypertension: I10

## 2023-07-10 HISTORY — DX: Atherosclerotic heart disease of native coronary artery without angina pectoris: I25.10

## 2023-07-10 HISTORY — DX: Personal history of adenomatous and serrated colon polyps: Z86.0101

## 2023-07-10 LAB — GLUCOSE, CAPILLARY: Glucose-Capillary: 131 mg/dL — ABNORMAL HIGH (ref 70–99)

## 2023-07-10 SURGERY — COLONOSCOPY WITH PROPOFOL
Anesthesia: General

## 2023-07-10 MED ORDER — SODIUM CHLORIDE 0.9 % IV SOLN: INTRAVENOUS | Status: DC

## 2023-07-10 MED ORDER — LIDOCAINE HCL (CARDIAC) PF 100 MG/5ML IV SOSY
PREFILLED_SYRINGE | INTRAVENOUS | Status: DC | PRN
  Administered 2023-07-10: 100 mg via INTRAVENOUS

## 2023-07-10 MED ORDER — PROPOFOL 500 MG/50ML IV EMUL
INTRAVENOUS | Status: DC | PRN
  Administered 2023-07-10: 60 mg via INTRAVENOUS
  Administered 2023-07-10: 100 ug/kg/min via INTRAVENOUS
  Administered 2023-07-10: 40 mg via INTRAVENOUS

## 2023-07-10 NOTE — Interval H&P Note (Signed)
History and Physical Interval Note:  07/10/2023 11:18 AM  Danny Maldonado  has presented today for surgery, with the diagnosis of Z86.0101 (ICD-10-CM) - Hx of adenomatous colonic polyps K21.9 (ICD-10-CM) - Gastroesophageal reflux disease, unspecified whether esophagitis present.  The various methods of treatment have been discussed with the patient and family. After consideration of risks, benefits and other options for treatment, the patient has consented to  Procedure(s): COLONOSCOPY WITH PROPOFOL (N/A) ESOPHAGOGASTRODUODENOSCOPY (EGD) WITH PROPOFOL (N/A) as a surgical intervention.  The patient's history has been reviewed, patient examined, no change in status, stable for surgery.  I have reviewed the patient's chart and labs.  Questions were answered to the patient's satisfaction.     Meta, [place]

## 2023-07-10 NOTE — Op Note (Signed)
[facility] Gastroenterology Patient Name: Danny Maldonado Procedure Date: 07/10/2023 11:45 AM MRN: [id number] Account #: [id number] Date of Birth: 26-Apr-1956 Admit Type: Outpatient Age: 67 Room: [facility] ENDO ROOM 1 Gender: Male Note Status: Finalized Instrument Name: Upper Endoscope [id number] Procedure:             Upper GI endoscopy Indications:           Gastro-esophageal reflux disease Providers:             Boykin Nearing. Ja Pistole MD, MD Medicines:             Propofol per Anesthesia Complications:         No immediate complications. Procedure:             Pre-Anesthesia Assessment:                        - The risks and benefits of the procedure and the                         sedation options and risks were discussed with the                         patient. All questions were answered and informed                         consent was obtained.                        - Patient identification and proposed procedure were                         verified prior to the procedure by the nurse. The                         procedure was verified in the procedure room.                        - ASA Grade Assessment: III - A patient with severe                         systemic disease.                        - After reviewing the risks and benefits, the patient                         was deemed in satisfactory condition to undergo the                         procedure.                        After obtaining informed consent, the endoscope was                         passed under direct vision. Throughout the procedure,                         the patient's blood pressure, pulse, and oxygen  saturations were monitored continuously. The Endoscope                         was introduced through the mouth, and advanced to the                         third part of duodenum. The upper GI endoscopy was                         accomplished without difficulty. The  patient tolerated                         the procedure well. Findings:      The esophagus was normal.      The stomach was normal.      The examined duodenum was normal. Impression:            - Normal esophagus.                        - Normal stomach.                        - Normal examined duodenum.                        - No specimens collected. Recommendation:        - Continue present medications.                        - Proceed with colonoscopy Procedure Code(s):     --- Professional ---                        [id number], Esophagogastroduodenoscopy, flexible,                         transoral; diagnostic, including collection of                         specimen(s) by brushing or washing, when performed                         (separate procedure) Diagnosis Code(s):     --- Professional ---                        K21.9, Gastro-esophageal reflux disease without                         esophagitis CPT copyright 2022 [employer] Association. All rights reserved. The codes documented in this report are preliminary and upon coder review may  be revised to meet current compliance requirements. Stanton Kidney MD, MD 07/10/2023 12:00:05 PM This report has been signed electronically. Number of Addenda: 0 Note Initiated On: 07/10/2023 11:45 AM Estimated Blood Loss:  Estimated blood loss: none.      [facility]

## 2023-07-10 NOTE — Anesthesia Preprocedure Evaluation (Signed)
Anesthesia Evaluation  Patient identified by MRN, date of birth, ID band Patient awake    Reviewed: Allergy & Precautions, NPO status , Patient's Chart, lab work & pertinent test results  History of Anesthesia Complications Negative for: history of anesthetic complications  Airway Mallampati: III  TM Distance: >3 FB Neck ROM: full    Dental no notable dental hx.    Pulmonary sleep apnea and Continuous Positive Airway Pressure Ventilation , former smoker   Pulmonary exam normal        Cardiovascular hypertension, On Medications and On Home Beta Blockers + CAD  Normal cardiovascular exam     Neuro/Psych negative neurological ROS  negative psych ROS   GI/Hepatic Neg liver ROS,GERD  Controlled,,  Endo/Other  negative endocrine ROSdiabetes, Type 2    Renal/GU negative Renal ROS  negative genitourinary   Musculoskeletal   Abdominal   Peds  Hematology negative hematology ROS (+)   Anesthesia Other Findings Past Medical History: No date: Actinic keratosis No date: Allergy No date: Atherosclerosis of abdominal aorta (HCC) No date: Cancer (HCC) No date: Coronary artery disease No date: Diabetes mellitus without complication (HCC) No date: DJD (degenerative joint disease) No date: Gastritis No date: GERD (gastroesophageal reflux disease) No date: Hx of adenomatous colonic polyps No date: Hyperlipidemia No date: Hypertension No date: Seasonal allergies No date: Sleep apnea 06/08/2010: Squamous cell carcinoma of skin     Comment:  R temple  Past Surgical History: No date: CARDIAC CATHETERIZATION No date: CHOLECYSTECTOMY 09/26/2017: COLONOSCOPY WITH PROPOFOL; N/A     Comment:  Procedure: COLONOSCOPY WITH PROPOFOL;  Surgeon: Scot Jun, MD;  Location: [facility] ENDOSCOPY;  Service:               Endoscopy;  Laterality: N/A; No date: Dental implant No date: ESOPHAGOGASTRODUODENOSCOPY No date:  HERNIA REPAIR No date: right trigger thumb release No date: VASECTOMY  BMI    Body Mass Index: 38.01 kg/m      Reproductive/Obstetrics negative OB ROS                             Anesthesia Physical Anesthesia Plan  ASA: 3  Anesthesia Plan: General   Post-op Pain Management: Minimal or no pain anticipated   Induction: Intravenous  PONV Risk Score and Plan: 1 and Propofol infusion and TIVA  Airway Management Planned: Natural Airway and Nasal Cannula  Additional Equipment:   Intra-op Plan:   Post-operative Plan:   Informed Consent: I have reviewed the patients History and Physical, chart, labs and discussed the procedure including the risks, benefits and alternatives for the proposed anesthesia with the patient or authorized representative who has indicated his/her understanding and acceptance.     Dental Advisory Given  Plan Discussed with: Anesthesiologist, CRNA and Surgeon  Anesthesia Plan Comments: (Patient consented for risks of anesthesia including but not limited to:  - adverse reactions to medications - risk of airway placement if required - damage to eyes, teeth, lips or other oral mucosa - nerve damage due to positioning  - sore throat or hoarseness - Damage to heart, brain, nerves, lungs, other parts of body or loss of life  Patient voiced understanding and assent.)       Anesthesia Quick Evaluation

## 2023-07-10 NOTE — Transfer of Care (Signed)
Immediate Anesthesia Transfer of Care Note  Patient: Danny Maldonado  Procedure(s) Performed: COLONOSCOPY WITH PROPOFOL ESOPHAGOGASTRODUODENOSCOPY (EGD) WITH PROPOFOL  Patient Location: PACU  Anesthesia Type:MAC  Level of Consciousness: awake  Airway & Oxygen Therapy: Patient Spontanous Breathing  Post-op Assessment: Report given to RN and Post -op Vital signs reviewed and stable  Post vital signs: Reviewed and stable  Last Vitals:  Vitals Value Taken Time  BP 100/68 07/10/23 1223  Temp 36.1 C 07/10/23 1223  Pulse 73 07/10/23 1224  Resp    SpO2 96 % 07/10/23 1224  Vitals shown include unfiled device data.  Last Pain:  Vitals:   07/10/23 1223  TempSrc: Temporal  PainSc: 0-No pain         Complications: No notable events documented.

## 2023-07-10 NOTE — Op Note (Signed)
[facility] Patient Name: Danny Maldonado Procedure Date: 07/10/2023 11:44 AM MRN: [id number] Account #: [id number] Date of Birth: 10-May-1956 Admit Type: Outpatient Age: 67 Room: [facility] ENDO ROOM 1 Gender: Male Note Status: Finalized Instrument Name: Prentice Docker [id number] Procedure:             Colonoscopy Indications:           High risk colon cancer surveillance: Personal history                         of multiple (3 or more) adenomas Providers:             Boykin Nearing. Arianne Klinge MD, MD Medicines:             Propofol per Anesthesia Complications:         No immediate complications. Estimated blood loss: None. Procedure:             Pre-Anesthesia Assessment:                        - The risks and benefits of the procedure and the                         sedation options and risks were discussed with the                         patient. All questions were answered and informed                         consent was obtained.                        - Patient identification and proposed procedure were                         verified prior to the procedure by the nurse. The                         procedure was verified in the procedure room.                        - ASA Grade Assessment: III - A patient with severe                         systemic disease.                        - After reviewing the risks and benefits, the patient                         was deemed in satisfactory condition to undergo the                         procedure.                        After obtaining informed consent, the colonoscope was                         passed under direct vision. Throughout the procedure,  the patient's blood pressure, pulse, and oxygen                         saturations were monitored continuously. The                         Colonoscope was introduced through the anus and                         advanced to the the cecum,  identified by appendiceal                         orifice and ileocecal valve. The colonoscopy was                         performed without difficulty. The patient tolerated                         the procedure well. The quality of the bowel                         preparation was adequate. The ileocecal valve,                         appendiceal orifice, and rectum were photographed. Findings:      The perianal and digital rectal examinations were normal. Pertinent       negatives include normal sphincter tone and no palpable rectal lesions.      Non-bleeding internal hemorrhoids were found during retroflexion. The       hemorrhoids were Grade I (internal hemorrhoids that do not prolapse).      Two semi-pedunculated polyps were found in the ascending colon. The       polyps were 9 to 12 mm in size. These polyps were removed with a hot       snare. Resection and retrieval were complete.      The exam was otherwise without abnormality. Impression:            - Non-bleeding internal hemorrhoids.                        - Two 9 to 12 mm polyps in the ascending colon,                         removed with a hot snare. Resected and retrieved.                        - The examination was otherwise normal. Recommendation:        - Patient has a contact number available for                         emergencies. The signs and symptoms of potential                         delayed complications were discussed with the patient.                         Return to normal activities tomorrow. Written  discharge instructions were provided to the patient.                        - Resume previous diet.                        - Continue present medications.                        - Repeat colonoscopy is recommended for surveillance.                         The colonoscopy date will be determined after                         pathology results from today's exam become available                          for review.                        - Return to GI office as previously scheduled.                        - The findings and recommendations were discussed with                         the patient. Procedure Code(s):     --- Professional ---                        [phone], Colonoscopy, flexible; with removal of                         tumor(s), polyp(s), or other lesion(s) by snare                         technique Diagnosis Code(s):     --- Professional ---                        K64.0, First degree hemorrhoids                        D12.2, Benign neoplasm of ascending colon                        Z86.010, Personal history of colonic polyps CPT copyright 2022 [employer]. All rights reserved. The codes documented in this report are preliminary and upon coder review may  be revised to meet current compliance requirements. Stanton Kidney MD, MD 07/10/2023 12:21:52 PM This report has been signed electronically. Number of Addenda: 0 Note Initiated On: 07/10/2023 11:44 AM Scope Withdrawal Time: 0 hours 11 minutes 26 seconds  Total Procedure Duration: 0 hours 15 minutes 6 seconds  Estimated Blood Loss:  Estimated blood loss: none.      [facility]

## 2023-07-10 NOTE — Anesthesia Postprocedure Evaluation (Signed)
Anesthesia Post Note  Patient: Danny Maldonado  Procedure(s) Performed: COLONOSCOPY WITH PROPOFOL ESOPHAGOGASTRODUODENOSCOPY (EGD) WITH PROPOFOL  Patient location during evaluation: Endoscopy Anesthesia Type: General Level of consciousness: awake and alert Pain management: pain level controlled Vital Signs Assessment: post-procedure vital signs reviewed and stable Respiratory status: spontaneous breathing, nonlabored ventilation, respiratory function stable and patient connected to nasal cannula oxygen Cardiovascular status: blood pressure returned to baseline and stable Postop Assessment: no apparent nausea or vomiting Anesthetic complications: no   No notable events documented.   Last Vitals:  Vitals:   07/10/23 1236 07/10/23 1244  BP: 117/76 132/76  Pulse: 70 81  Temp:    SpO2: 98% 98%    Last Pain:  Vitals:   07/10/23 1244  TempSrc:   PainSc: 0-No pain                 Louie Boston

## 2023-07-10 NOTE — H&P (Signed)
Outpatient short stay form Pre-procedure 07/10/2023 11:18 AM Dontai Maldonado K. Norma Danny Maldonado, M.D.  Primary Physician: Danny Maldonado, M.D.  Reason for visit:  Personal history of adenomatous colon polyps  History of present illness:   [name] is a 67 year old male with history of CAD,T2DM, DJD, HTN, HLD, morbid obesity, seasonal allergies, OSA on CPAP, asthma, GERD returns for f/up colonoscopy for [facility] colon polyps. He has no upper or lower GI complaints. He reports normal bowel habits. He presents on pantoprazole milligrams daily.    Current Facility-Administered Medications:    0.9 %  sodium chloride infusion, , Intravenous, Continuous, [place], Boykin Nearing, MD, Last Rate: 20 mL/hr at 07/10/23 1036, New Bag at 07/10/23 1036  Medications Prior to Admission  Medication Sig Dispense Refill Last Dose   acetaminophen (TYLENOL) 500 MG tablet Take 1,000 mg by mouth every 6 (six) hours as needed (PRN - takes approximately once per week).   07/09/2023   albuterol (ACCUNEB) 1.25 MG/3ML nebulizer solution Take 3 mLs (1.25 mg total) by nebulization every 6 (six) hours as needed for Wheezing 75 mL 2 Past Week   albuterol (VENTOLIN HFA) 108 (90 Base) MCG/ACT inhaler Inhale 2 puffs into the lungs every 6 (six) hours as needed for wheezing. 18 g 2 Past Week   atorvastatin (LIPITOR) 20 MG tablet Take 1 tablet (20 mg total) by mouth once daily 90 tablet 3 07/09/2023   bisoprolol-hydrochlorothiazide (ZIAC) 5-6.25 MG tablet Take 1 tablet by mouth daily. 90 tablet 3 07/09/2023   budesonide (PULMICORT) 0.5 MG/2ML nebulizer solution USE 2 MILLILITERS (ML) BY NEBULIZATION 2 TIMES DAILY 120 mL 5 07/09/2023   Cyanocobalamin 500 MCG SUBL Place 1,000 mcg under the tongue daily.   Past Week   empagliflozin (JARDIANCE) 25 MG TABS tablet Take 1 tablet (25 mg total) by mouth once daily 30 tablet 11 07/09/2023   gabapentin (NEURONTIN) 300 MG capsule Take 1 capsule (300 mg total) by mouth 2 (two) times daily. 60 capsule 11 07/09/2023    glimepiride (AMARYL) 2 MG tablet Take 1 tablet (2 mg total) by mouth daily with breakfast. 90 tablet 1 07/09/2023   ipratropium-albuterol (DUONEB) 0.5-2.5 (3) MG/3ML SOLN Inhale 3 mLs by nebulization 4 (four) times daily as needed for wheezing. 360 mL 8 Past Week   loratadine (CLARITIN) 10 MG tablet Take 10 mg by mouth daily.   07/09/2023   magnesium oxide (MAG-OX) 400 MG tablet Take 400 mg by mouth daily.   Past Week   metFORMIN (GLUCOPHAGE) 1000 MG tablet Take 1 tablet (1,000 mg total) by mouth 2 (two) times daily with a meal. 180 tablet 3 07/09/2023   montelukast (SINGULAIR) 10 MG tablet Take 1 tablet (10 mg total) by mouth at bedtime. 90 tablet 1 07/09/2023   Multiple Vitamin (MULTIVITAMIN) tablet Take 1 tablet by mouth daily.   Past Week   pantoprazole (PROTONIX) 40 MG tablet Take 1 tablet (40 mg total) by mouth daily. 90 tablet 5 07/09/2023   tamsulosin (FLOMAX) 0.4 MG CAPS capsule TAKE 1 CAPSULE BY MOUTH EVERY DAY 90 capsule 1 07/09/2023   traMADol (ULTRAM) 50 MG tablet Take 1 tablet (50 mg total) by mouth every 6 (six) hours as needed. 12 tablet 0 07/09/2023   Blood Glucose Monitoring Suppl (ONE TOUCH ULTRA 2) w/Device KIT Use as instructed. 1 kit 0    ertapenem (INVANZ) 1 g injection  (Patient not taking: Reported on 07/10/2023)   Completed Course   glucose blood (ONETOUCH ULTRA TEST) test strip 1 each by Other route 2 (  two) times daily. 200 each 3    OneTouch Delica Lancets 33G MISC 1 each by Other route 2 (two) times daily. 200 each 3    Semaglutide,0.25 or 0.5MG /DOS, (OZEMPIC, 0.25 OR 0.5 MG/DOSE,) 2 MG/3ML SOPN Inject 0.5 mg into the skin once a week. 12 mL 3 06/27/2023     No Known Allergies   Past Medical History:  Diagnosis Date   Actinic keratosis    Allergy    Atherosclerosis of abdominal aorta (HCC)    Cancer (HCC)    Coronary artery disease    Diabetes mellitus without complication (HCC)    DJD (degenerative joint disease)    Gastritis    GERD (gastroesophageal  reflux disease)    Hx of adenomatous colonic polyps    Hyperlipidemia    Hypertension    Seasonal allergies    Sleep apnea    Squamous cell carcinoma of skin 06/08/2010   R temple    Review of systems:  Otherwise negative.    Physical Exam  Gen: Alert, oriented. Appears stated age.  HEENT: [place]/AT. PERRLA. Lungs: CTA, no wheezes. CV: RR nl S1, S2. Abd: soft, benign, no masses. BS+ Ext: No edema. Pulses 2+    Planned procedures: Proceed with colonoscopy. The patient understands the nature of the planned procedure, indications, risks, alternatives and potential complications including but not limited to bleeding, infection, perforation, damage to internal organs and possible oversedation/side effects from anesthesia. The patient agrees and gives consent to proceed.  Please refer to procedure notes for findings, recommendations and patient disposition/instructions.     Jemimah Cressy K. Norma Danny Maldonado, M.D. Gastroenterology 07/10/2023  11:18 AM

## 2023-07-11 ENCOUNTER — Encounter: Payer: Self-pay | Admitting: Internal Medicine

## 2023-07-11 ENCOUNTER — Other Ambulatory Visit: Payer: Self-pay

## 2023-07-11 LAB — SURGICAL PATHOLOGY

## 2023-07-15 ENCOUNTER — Encounter: Payer: Self-pay | Admitting: Urology

## 2023-07-24 DIAGNOSIS — H524 Presbyopia: Secondary | ICD-10-CM | POA: Diagnosis not present

## 2023-07-24 DIAGNOSIS — H35033 Hypertensive retinopathy, bilateral: Secondary | ICD-10-CM | POA: Diagnosis not present

## 2023-07-24 DIAGNOSIS — I10 Essential (primary) hypertension: Secondary | ICD-10-CM | POA: Diagnosis not present

## 2023-07-24 DIAGNOSIS — H25813 Combined forms of age-related cataract, bilateral: Secondary | ICD-10-CM | POA: Diagnosis not present

## 2023-07-24 DIAGNOSIS — E119 Type 2 diabetes mellitus without complications: Secondary | ICD-10-CM | POA: Diagnosis not present

## 2023-07-30 NOTE — Progress Notes (Unsigned)
08/02/2023 1:37 PM   [name] 09-03-1955 [id number]  Referring provider: Marguarite Arbour, MD [address] [facility] [place],  [place] [address]  Urological history: 1. rUTI's -Contributing factors of age, diabetes and incomplete bladder emptying -non contrast CT abdomen (05/2023) -benign renal cyst -contrast CT pelvis (06/2023) -no prostate abscess  2. BPH with LU TS -PSA (02/2023) - 0.79 -Tamsulosin 0.4 mg daily  Chief Complaint  Patient presents with   Benign Prostatic Hypertrophy   Recurrent UTI   HPI: Danny Maldonado is a 67 y.o. male who presents today for follow up.   Previous records reviewed.   I PSS 13/6  PVR 161 mL  UA nitrate positive, greater than 30 WBCs, 3-10 RBCs, mucus threads present and many bacteria.    He continues to have issues with urgency, frequency and painful urination.  He had been without the Flomax for approximately 2 weeks but restarted on November 19.  He feels that he empties his bladder.    He states his symptoms did abate after he was on IV antibiotics but then quickly returned after he underwent his colonoscopy prep and the colonoscopy itself.  Patient denies any modifying or aggravating factors.  Patient denies any gross hematuria or suprapubic/flank pain.  Patient denies any fevers, chills, nausea or vomiting.     IPSS     Row Name 08/02/23 1300         International Prostate Symptom Score   How often have you had the sensation of not emptying your bladder? Less than 1 in 5     How often have you had to urinate less than every two hours? Less than half the time     How often have you found you stopped and started again several times when you urinated? Not at All     How often have you found it difficult to postpone urination? About half the time     How often have you had a weak urinary stream? Less than 1 in 5 times     How often have you had to strain to start urination? Less than 1 in 5 times      How many times did you typically get up at night to urinate? 5 Times     Total IPSS Score 13       Quality of Life due to urinary symptoms   If you were to spend the rest of your life with your urinary condition just the way it is now how would you feel about that? Terrible              Score:  1-7 Mild 8-19 Moderate 20-35 Severe   PMH: Past Medical History:  Diagnosis Date   Actinic keratosis    Allergy    Atherosclerosis of abdominal aorta (HCC)    Cancer (HCC)    Coronary artery disease    Diabetes mellitus without complication (HCC)    DJD (degenerative joint disease)    Gastritis    GERD (gastroesophageal reflux disease)    Hx of adenomatous colonic polyps    Hyperlipidemia    Hypertension    Seasonal allergies    Sleep apnea    Squamous cell carcinoma of skin 06/08/2010   R temple    Surgical History: Past Surgical History:  Procedure Laterality Date   CARDIAC CATHETERIZATION     CHOLECYSTECTOMY     COLONOSCOPY WITH PROPOFOL N/A 09/26/2017   Procedure: COLONOSCOPY WITH PROPOFOL;  Surgeon: Scot Jun, MD;  Location: [facility] ENDOSCOPY;  Service: Endoscopy;  Laterality: N/A;   COLONOSCOPY WITH PROPOFOL N/A 07/10/2023   Procedure: COLONOSCOPY WITH PROPOFOL;  Surgeon: Toledo, Boykin Nearing, MD;  Location: [facility] ENDOSCOPY;  Service: Gastroenterology;  Laterality: N/A;   Dental implant     ESOPHAGOGASTRODUODENOSCOPY     ESOPHAGOGASTRODUODENOSCOPY (EGD) WITH PROPOFOL N/A 07/10/2023   Procedure: ESOPHAGOGASTRODUODENOSCOPY (EGD) WITH PROPOFOL;  Surgeon: Toledo, Boykin Nearing, MD;  Location: [facility] ENDOSCOPY;  Service: Gastroenterology;  Laterality: N/A;   HERNIA REPAIR     POLYPECTOMY  07/10/2023   Procedure: POLYPECTOMY;  Surgeon: Toledo, Boykin Nearing, MD;  Location: [facility] ENDOSCOPY;  Service: Gastroenterology;;   right trigger thumb release     VASECTOMY      Home Medications:  Allergies as of 08/02/2023   No Known Allergies      Medication List         Accurate as of August 02, 2023  1:37 PM. If you have any questions, ask your nurse or doctor.          STOP taking these medications    ertapenem 1 g injection Commonly known as: INVANZ Stopped by: Jobeth Pangilinan       TAKE these medications    acetaminophen 500 MG tablet Commonly known as: TYLENOL Take 1,000 mg by mouth every 6 (six) hours as needed (PRN - takes approximately once per week).   albuterol 1.25 MG/3ML nebulizer solution Commonly known as: ACCUNEB Use one vial via nebulizer every 6 hours as needed for wheezing (Take 3 mLs (1.25 mg total) by nebulization every 6 (six) hours as needed for Wheezing)   albuterol 108 (90 Base) MCG/ACT inhaler Commonly known as: VENTOLIN HFA Inhale 2 puffs into the lungs every 6 (six) hours as needed for wheezing.   atorvastatin 20 MG tablet Commonly known as: LIPITOR Take 1 tablet (20 mg total) by mouth once daily   bisoprolol-hydrochlorothiazide 5-6.25 MG tablet Commonly known as: ZIAC Take 1 tablet by mouth daily.   budesonide 0.5 MG/2ML nebulizer solution Commonly known as: PULMICORT USE 2 MILLILITERS (ML) BY NEBULIZATION 2 TIMES DAILY   Cyanocobalamin 500 MCG Subl Place 1,000 mcg under the tongue daily.   gabapentin 300 MG capsule Commonly known as: NEURONTIN Take 1 capsule (300 mg total) by mouth 2 (two) times daily.   glimepiride 2 MG tablet Commonly known as: AMARYL Take 1 tablet (2 mg total) by mouth daily with breakfast.   ipratropium-albuterol 0.5-2.5 (3) MG/3ML Soln Commonly known as: DUONEB Inhale 3 mLs by nebulization 4 (four) times daily as needed for wheezing.   Jardiance 25 MG Tabs tablet Generic drug: empagliflozin Take 1 tablet (25 mg total) by mouth once daily   loratadine 10 MG tablet Commonly known as: CLARITIN Take 10 mg by mouth daily.   magnesium oxide 400 MG tablet Commonly known as: MAG-OX Take 400 mg by mouth daily.   metFORMIN 1000 MG tablet Commonly known as:  GLUCOPHAGE Take 1 tablet (1,000 mg total) by mouth 2 (two) times daily with a meal.   montelukast 10 MG tablet Commonly known as: SINGULAIR Take 1 tablet (10 mg total) by mouth at bedtime.   multivitamin tablet Take 1 tablet by mouth daily.   ONE TOUCH ULTRA 2 w/Device Kit Use as instructed.   OneTouch Delica Plus Lancet33G Misc Use to test twice a day (1 each by Other route 2 (two) times daily.)   OneTouch Ultra Test test strip Generic drug: glucose blood Test twice a  day (1 each by Other route 2 (two) times daily.)   Ozempic (0.25 or 0.5 MG/DOSE) 2 MG/3ML Sopn Generic drug: Semaglutide(0.25 or 0.5MG /DOS) Inject 0.5 mg into the skin once a week.   pantoprazole 40 MG tablet Commonly known as: PROTONIX Take 1 tablet (40 mg total) by mouth daily.   tamsulosin 0.4 MG Caps capsule Commonly known as: FLOMAX TAKE 1 CAPSULE BY MOUTH EVERY DAY   traMADol 50 MG tablet Commonly known as: Ultram Take 1 tablet (50 mg total) by mouth every 6 (six) hours as needed.        Allergies: No Known Allergies  Family History: Family History  Problem Relation Age of Onset   Hypertension Mother    COPD Father    Hypertension Father    Stroke Maternal Grandfather     Social History:  reports that he quit smoking about 37 years ago. His smoking use included cigarettes. He has been exposed to tobacco smoke. He has never used smokeless tobacco. He reports that he does not drink alcohol and does not use drugs.  ROS: Pertinent ROS in HPI  Physical Exam: BP 126/74   Pulse 79   Ht 5\' 8"  (1.727 m)   Wt 250 lb (113.4 kg)   BMI 38.01 kg/m   Constitutional:  Well nourished. Alert and oriented, No acute distress. HEENT: [place] AT, moist mucus membranes.  Trachea midline Cardiovascular: No clubbing, cyanosis, or edema. Respiratory: Normal respiratory effort, no increased work of breathing. Neurologic: Grossly intact, no focal deficits, moving all 4 extremities. Psychiatric: Normal mood  and affect.  Laboratory Data: Lab Results  Component Value Date   WBC 10.3 06/19/2023   HGB 14.2 06/19/2023   HCT 42.4 06/19/2023   MCV 83.1 06/19/2023   PLT 238 06/19/2023    Lab Results  Component Value Date   CREATININE 0.93 06/19/2023    Lab Results  Component Value Date   AST 23 06/19/2023   Lab Results  Component Value Date   ALT 22 06/19/2023   Urinalysis See EPIC and HPI I have reviewed the labs.   Pertinent Imaging:  08/02/23 13:13  Scan Result 161 ml    Assessment & Plan:    1. rUTI's/persistent UTI -completed 15 days of Ertapenem, but symptoms returned after colonoscopy -UA grossly infected -urine culture pending -Explained that with his history of highly resistant bacteria and he has completed a course of IV antibiotics, it is possible that his bladder flora has changed and another bacteria is now responsible for his symptoms -I will wait for urine culture results to prescribe antibiotic -Discussed that him being diabetic was also contributing to his symptoms and his persistent UTIs  2. BPH with LUTS -Continue tamsulosin 0.4 mg to help facilitate bladder emptying -Explained that incomplete bladder emptying may also be contributing to his persistent UTI -Explained that we will need to get him scheduled for cystoscopy for further evaluation -Explained that the cystoscopy is a safe and common diagnostic test performed by one of our physicians in the office.  It consist of using a thin, lighted tube to look directly inside the bladder, prostate  and urethra to evaluate the anatomy.  The procedure is brief, typically taking about 5 minutes. -This will enable [place] to assess bladder health, diagnose and enlarged prostate, assess which BPH procedure may be most appropriate and rule out other bladder conditions (stricture disease, stones, cancer, etc.)  -Advised the patient that there are no restrictions to eating or drinking prior to the cystoscopy -  They can  continue to take all of their medications as prescribed -They can drive themselves to and from the appointment -I explained that during the procedure, the area around the urethra will be cleansed thoroughly, topical anesthetic will be applied to numb your urethra, the thin tube is then gently inserted through the urethra into your bladder while fluid flows through the tube to the bladder to enable better visualization -I explained the procedure is usually not painful, however there may be some discomfort (pinching feeling), and they may feel an urge to urinate, coolness or fullness in the bladder and then the cystoscope is removed -After the cystoscopy, I advised them that they may experience urinary frequency, hematuria, dysuria which will resolve within 24 to 48 hours -Reviewed red flag signs (fever, bright red blood or blood clots in the urine, abdominal pain or difficulty urinating) and to contact the office immediately or seek treatment in the ED if they should experience any of these -The physician will discuss the results of the cystoscopy at the time of the procedure  -He is in agreement and would like to proceed with cystoscopy -I explained to him that we will need to put it off for at least 3 weeks in order to clear his urine of infection prior to instrumentation, he voices understanding   Return for cysto w/ Dr. Lonna Cobb for persistent UTI's .  These notes generated with voice recognition software. I apologize for typographical errors.  Cloretta Ned  [facility] [address]  [address] [place], [place] [address] [phone]

## 2023-08-02 ENCOUNTER — Encounter: Payer: Self-pay | Admitting: Urology

## 2023-08-02 ENCOUNTER — Other Ambulatory Visit: Payer: Self-pay

## 2023-08-02 ENCOUNTER — Ambulatory Visit: Payer: PPO | Admitting: Urology

## 2023-08-02 VITALS — BP 126/74 | HR 79 | Ht 68.0 in | Wt 250.0 lb

## 2023-08-02 DIAGNOSIS — N401 Enlarged prostate with lower urinary tract symptoms: Secondary | ICD-10-CM

## 2023-08-02 DIAGNOSIS — N390 Urinary tract infection, site not specified: Secondary | ICD-10-CM | POA: Diagnosis not present

## 2023-08-02 DIAGNOSIS — Z87898 Personal history of other specified conditions: Secondary | ICD-10-CM | POA: Diagnosis not present

## 2023-08-02 DIAGNOSIS — N138 Other obstructive and reflux uropathy: Secondary | ICD-10-CM

## 2023-08-02 DIAGNOSIS — R82998 Other abnormal findings in urine: Secondary | ICD-10-CM

## 2023-08-02 LAB — MICROSCOPIC EXAMINATION: WBC, UA: >30 /[HPF] — AB (ref 0–5)

## 2023-08-02 LAB — URINALYSIS, COMPLETE
Bilirubin, UA: NEGATIVE
Glucose, UA: NEGATIVE
Ketones, UA: NEGATIVE
Nitrite, UA: NEGATIVE
Protein,UA: NEGATIVE
Specific Gravity, UA: 1.015 (ref 1.005–1.030)
Urobilinogen, Ur: 0.2 mg/dL (ref 0.2–1.0)
pH, UA: 5.5 (ref 5.0–7.5)

## 2023-08-02 LAB — BLADDER SCAN AMB NON-IMAGING: Scan Result: 161

## 2023-08-02 MED ORDER — GLIMEPIRIDE 2 MG PO TABS
2.0000 mg | ORAL_TABLET | Freq: Every day | ORAL | 1 refills | Status: DC
  Filled 2023-08-02: qty 90, 90d supply, fill #0
  Filled 2023-11-06: qty 90, 90d supply, fill #1

## 2023-08-02 NOTE — Patient Instructions (Signed)

## 2023-08-03 ENCOUNTER — Other Ambulatory Visit: Payer: Self-pay

## 2023-08-06 ENCOUNTER — Other Ambulatory Visit: Payer: Self-pay | Admitting: Urology

## 2023-08-06 DIAGNOSIS — N390 Urinary tract infection, site not specified: Secondary | ICD-10-CM

## 2023-08-06 LAB — CULTURE, URINE COMPREHENSIVE

## 2023-08-06 MED ORDER — NITROFURANTOIN MONOHYD MACRO 100 MG PO CAPS: 100.0000 mg | ORAL_CAPSULE | Freq: Two times a day (BID) | ORAL | 0 refills | Status: DC

## 2023-08-09 ENCOUNTER — Other Ambulatory Visit: Payer: Self-pay | Admitting: Urology

## 2023-08-09 DIAGNOSIS — N390 Urinary tract infection, site not specified: Secondary | ICD-10-CM

## 2023-08-09 MED ORDER — NITROFURANTOIN MONOHYD MACRO 100 MG PO CAPS
100.0000 mg | ORAL_CAPSULE | Freq: Every day | ORAL | 0 refills | Status: DC
Start: 2023-08-09 — End: 2023-09-05

## 2023-08-13 DIAGNOSIS — Z9889 Other specified postprocedural states: Secondary | ICD-10-CM | POA: Diagnosis not present

## 2023-08-13 DIAGNOSIS — G4733 Obstructive sleep apnea (adult) (pediatric): Secondary | ICD-10-CM | POA: Diagnosis not present

## 2023-08-13 DIAGNOSIS — I700 Atherosclerosis of aorta: Secondary | ICD-10-CM | POA: Diagnosis not present

## 2023-08-13 DIAGNOSIS — I2510 Atherosclerotic heart disease of native coronary artery without angina pectoris: Secondary | ICD-10-CM | POA: Diagnosis not present

## 2023-08-13 DIAGNOSIS — E782 Mixed hyperlipidemia: Secondary | ICD-10-CM | POA: Diagnosis not present

## 2023-08-13 DIAGNOSIS — I10 Essential (primary) hypertension: Secondary | ICD-10-CM | POA: Diagnosis not present

## 2023-08-15 ENCOUNTER — Other Ambulatory Visit: Payer: Self-pay

## 2023-08-15 ENCOUNTER — Encounter: Payer: Self-pay | Admitting: Internal Medicine

## 2023-08-15 DIAGNOSIS — R6889 Other general symptoms and signs: Secondary | ICD-10-CM | POA: Diagnosis not present

## 2023-08-15 DIAGNOSIS — R051 Acute cough: Secondary | ICD-10-CM | POA: Diagnosis not present

## 2023-08-15 DIAGNOSIS — Z03818 Encounter for observation for suspected exposure to other biological agents ruled out: Secondary | ICD-10-CM | POA: Diagnosis not present

## 2023-08-15 DIAGNOSIS — J019 Acute sinusitis, unspecified: Secondary | ICD-10-CM | POA: Diagnosis not present

## 2023-08-15 DIAGNOSIS — J188 Other pneumonia, unspecified organism: Secondary | ICD-10-CM | POA: Diagnosis not present

## 2023-08-15 DIAGNOSIS — B9689 Other specified bacterial agents as the cause of diseases classified elsewhere: Secondary | ICD-10-CM | POA: Diagnosis not present

## 2023-08-15 DIAGNOSIS — R509 Fever, unspecified: Secondary | ICD-10-CM | POA: Diagnosis not present

## 2023-08-15 DIAGNOSIS — R911 Solitary pulmonary nodule: Secondary | ICD-10-CM | POA: Diagnosis not present

## 2023-08-15 DIAGNOSIS — I517 Cardiomegaly: Secondary | ICD-10-CM | POA: Diagnosis not present

## 2023-08-15 MED ORDER — PREDNISONE 20 MG PO TABS
20.0000 mg | ORAL_TABLET | Freq: Two times a day (BID) | ORAL | 0 refills | Status: DC
  Filled 2023-08-15: qty 10, 5d supply, fill #0

## 2023-08-15 MED ORDER — BENZONATATE 200 MG PO CAPS
200.0000 mg | ORAL_CAPSULE | Freq: Three times a day (TID) | ORAL | 0 refills | Status: DC | PRN
  Filled 2023-08-15: qty 20, 7d supply, fill #0

## 2023-08-15 MED ORDER — MOXIFLOXACIN HCL 400 MG PO TABS
400.0000 mg | ORAL_TABLET | Freq: Every day | ORAL | 0 refills | Status: DC
  Filled 2023-08-15: qty 10, 10d supply, fill #0

## 2023-08-27 ENCOUNTER — Other Ambulatory Visit: Payer: Self-pay | Admitting: Internal Medicine

## 2023-08-27 DIAGNOSIS — R911 Solitary pulmonary nodule: Secondary | ICD-10-CM

## 2023-08-31 ENCOUNTER — Other Ambulatory Visit: Payer: Self-pay

## 2023-09-05 ENCOUNTER — Ambulatory Visit: Payer: PPO | Admitting: Urology

## 2023-09-05 VITALS — BP 130/80 | HR 86 | Ht 70.0 in | Wt 250.0 lb

## 2023-09-05 DIAGNOSIS — Z09 Encounter for follow-up examination after completed treatment for conditions other than malignant neoplasm: Secondary | ICD-10-CM

## 2023-09-05 DIAGNOSIS — N390 Urinary tract infection, site not specified: Secondary | ICD-10-CM

## 2023-09-05 DIAGNOSIS — R3129 Other microscopic hematuria: Secondary | ICD-10-CM | POA: Diagnosis not present

## 2023-09-05 NOTE — Progress Notes (Signed)
   09/05/23  CC:  Chief Complaint  Patient presents with   Cysto    HPI: Refer to [facility]'s note 08/02/2023.  Urine culture 08/02/2023 grew multidrug-resistant E. coli.  Treated with nitrofurantoin  and kept on low-dose until cystoscopy performed.  UA today clear  See rooming tab for vitals NED. A&Ox3.   No respiratory distress   Abd soft, NT, ND Normal phallus with bilateral descended testicles  Cystoscopy Procedure Note  Patient identification was confirmed, informed consent was obtained, and patient was prepped using Betadine solution.  Lidocaine  jelly was administered per urethral meatus.     Pre-Procedure: - Inspection reveals a normal caliber urethral meatus.  Procedure: The flexible cystoscope was introduced without difficulty - No urethral strictures/lesions are present. -Moderate lateral lobe enlargement prostate  -Mild elevation bladder neck - Bilateral ureteral orifices identified - Bladder mucosa  reveals no ulcers, tumors, or lesions - No bladder stones -Mild-moderate trabeculation  Retroflexion shows no abnormalities   Post-Procedure: - Patient tolerated the procedure well  Assessment/ Plan: Moderate BPH No bladder mucosal abnormalities   Glendia JAYSON Barba, MD

## 2023-09-06 LAB — MICROSCOPIC EXAMINATION: Bacteria, UA: NONE SEEN

## 2023-09-06 LAB — URINALYSIS, COMPLETE
Bilirubin, UA: NEGATIVE
Glucose, UA: NEGATIVE
Ketones, UA: NEGATIVE
Leukocytes,UA: NEGATIVE
Nitrite, UA: NEGATIVE
Protein,UA: NEGATIVE
RBC, UA: NEGATIVE
Specific Gravity, UA: 1.01 (ref 1.005–1.030)
Urobilinogen, Ur: 0.2 mg/dL (ref 0.2–1.0)
pH, UA: 6 (ref 5.0–7.5)

## 2023-09-11 ENCOUNTER — Encounter: Payer: Self-pay | Admitting: Urology

## 2023-09-18 ENCOUNTER — Ambulatory Visit
Admission: RE | Admit: 2023-09-18 | Discharge: 2023-09-18 | Disposition: A | Discharge status: Home / Self Care | Payer: PPO | Source: Ambulatory Visit | Attending: Internal Medicine | Admitting: Internal Medicine

## 2023-09-18 DIAGNOSIS — I700 Atherosclerosis of aorta: Secondary | ICD-10-CM | POA: Diagnosis not present

## 2023-09-18 DIAGNOSIS — R911 Solitary pulmonary nodule: Secondary | ICD-10-CM | POA: Diagnosis not present

## 2023-09-19 DIAGNOSIS — Z79899 Other long term (current) drug therapy: Secondary | ICD-10-CM | POA: Diagnosis not present

## 2023-09-19 DIAGNOSIS — E782 Mixed hyperlipidemia: Secondary | ICD-10-CM | POA: Diagnosis not present

## 2023-09-19 DIAGNOSIS — E118 Type 2 diabetes mellitus with unspecified complications: Secondary | ICD-10-CM | POA: Diagnosis not present

## 2023-09-19 DIAGNOSIS — I10 Essential (primary) hypertension: Secondary | ICD-10-CM | POA: Diagnosis not present

## 2023-09-26 ENCOUNTER — Other Ambulatory Visit: Payer: Self-pay

## 2023-09-26 DIAGNOSIS — E782 Mixed hyperlipidemia: Secondary | ICD-10-CM | POA: Diagnosis not present

## 2023-09-26 DIAGNOSIS — E118 Type 2 diabetes mellitus with unspecified complications: Secondary | ICD-10-CM | POA: Diagnosis not present

## 2023-09-26 DIAGNOSIS — E66812 Obesity, class 2: Secondary | ICD-10-CM | POA: Diagnosis not present

## 2023-09-26 DIAGNOSIS — Z0000 Encounter for general adult medical examination without abnormal findings: Secondary | ICD-10-CM | POA: Diagnosis not present

## 2023-09-26 DIAGNOSIS — I10 Essential (primary) hypertension: Secondary | ICD-10-CM | POA: Diagnosis not present

## 2023-09-26 DIAGNOSIS — Z6839 Body mass index (BMI) 39.0-39.9, adult: Secondary | ICD-10-CM | POA: Diagnosis not present

## 2023-09-26 DIAGNOSIS — Z79899 Other long term (current) drug therapy: Secondary | ICD-10-CM | POA: Diagnosis not present

## 2023-09-26 MED ORDER — OZEMPIC (1 MG/DOSE) 4 MG/3ML ~~LOC~~ SOPN
PEN_INJECTOR | SUBCUTANEOUS | 1 refills | Status: DC
  Filled 2023-09-26: qty 9, 84d supply, fill #0
  Filled 2023-12-14: qty 9, 84d supply, fill #1
  Filled 2023-12-18: qty 3, 28d supply, fill #1
  Filled 2023-12-18: qty 9, 84d supply, fill #1

## 2023-10-04 ENCOUNTER — Other Ambulatory Visit: Payer: Self-pay

## 2023-10-09 ENCOUNTER — Other Ambulatory Visit: Payer: Self-pay

## 2023-10-16 DIAGNOSIS — H903 Sensorineural hearing loss, bilateral: Secondary | ICD-10-CM | POA: Diagnosis not present

## 2023-10-29 ENCOUNTER — Other Ambulatory Visit: Payer: Self-pay

## 2023-10-30 NOTE — Progress Notes (Unsigned)
 10/31/2023 1:29 PM   [name] Apr 24, 1956 [id number]  Referring provider: Marguarite Arbour, MD [address] [facility] [place],  [place] [address]  Urological history: 1. rUTI's -Contributing factors of age, diabetes and incomplete bladder emptying -non contrast CT abdomen (05/2023) -benign renal cyst -contrast CT pelvis (06/2023) -no prostate abscess  2. BPH with LU TS -PSA (02/2023) - 0.79 -cysto (08/2023) moderate lateral lobe enlargement, mild elevation of bladder neck and mild to moderate trabeculation -Tamsulosin 0.4 mg daily  Chief Complaint  Patient presents with   Follow-up   HPI: Danny Maldonado is a 68 y.o. male who presents today for two month follow up.   Previous records reviewed.   At his visit on 08/02/2023, I PSS 13/6.  PVR 161 mL.  UA nitrate positive, greater than 30 WBCs, 3-10 RBCs, mucus threads present and many bacteria.   He continues to have issues with urgency, frequency and painful urination.  He had been without the Flomax for approximately 2 weeks but restarted on November 19.  He feels that he empties his bladder.  He states his symptoms did abate after he was on IV antibiotics but then quickly returned after he underwent his colonoscopy prep and the colonoscopy itself.  Patient denies any modifying or aggravating factors.  Patient denies any gross hematuria or suprapubic/flank pain.  Patient denies any fevers, chills, nausea or vomiting.   Cysto w/ BPH.    He feels well today.  His urinalysis with his primary care physician in January was benign.  He has a few daily MacDiarmid's left and he will finish that prescription by the end of the month.   Patient denies any modifying or aggravating factors.  Patient denies any recent UTI's, gross hematuria, dysuria or suprapubic/flank pain.  Patient denies any fevers, chills, nausea or vomiting.    He has discontinue the Jardiance and upped the Ozempic w/ Dr. Judithann Sheen,   He has not  shown any bladder outlet procedure unless its last option.  PMH: Past Medical History:  Diagnosis Date   Actinic keratosis    Allergy    Atherosclerosis of abdominal aorta (HCC)    Cancer (HCC)    Coronary artery disease    Diabetes mellitus without complication (HCC)    DJD (degenerative joint disease)    Gastritis    GERD (gastroesophageal reflux disease)    Hx of adenomatous colonic polyps    Hyperlipidemia    Hypertension    Seasonal allergies    Sleep apnea    Squamous cell carcinoma of skin 06/08/2010   R temple    Surgical History: Past Surgical History:  Procedure Laterality Date   CARDIAC CATHETERIZATION     CHOLECYSTECTOMY     COLONOSCOPY WITH PROPOFOL N/A 09/26/2017   Procedure: COLONOSCOPY WITH PROPOFOL;  Surgeon: Scot Jun, MD;  Location: [facility] ENDOSCOPY;  Service: Endoscopy;  Laterality: N/A;   COLONOSCOPY WITH PROPOFOL N/A 07/10/2023   Procedure: COLONOSCOPY WITH PROPOFOL;  Surgeon: Toledo, Boykin Nearing, MD;  Location: [facility] ENDOSCOPY;  Service: Gastroenterology;  Laterality: N/A;   Dental implant     ESOPHAGOGASTRODUODENOSCOPY     ESOPHAGOGASTRODUODENOSCOPY (EGD) WITH PROPOFOL N/A 07/10/2023   Procedure: ESOPHAGOGASTRODUODENOSCOPY (EGD) WITH PROPOFOL;  Surgeon: Toledo, Boykin Nearing, MD;  Location: [facility] ENDOSCOPY;  Service: Gastroenterology;  Laterality: N/A;   HERNIA REPAIR     POLYPECTOMY  07/10/2023   Procedure: POLYPECTOMY;  Surgeon: Toledo, Boykin Nearing, MD;  Location: [facility] ENDOSCOPY;  Service: Gastroenterology;;  right trigger thumb release     VASECTOMY      Home Medications:  Allergies as of 10/31/2023   No Known Allergies      Medication List        Accurate as of October 31, 2023  1:29 PM. If you have any questions, ask your nurse or doctor.          STOP taking these medications    Jardiance 25 MG Tabs tablet Generic drug: empagliflozin Stopped by: Carollee Herter Ronnie Mallette       TAKE these medications    acetaminophen 500 MG  tablet Commonly known as: TYLENOL Take 1,000 mg by mouth every 6 (six) hours as needed (PRN - takes approximately once per week).   albuterol 1.25 MG/3ML nebulizer solution Commonly known as: ACCUNEB Use one vial via nebulizer every 6 hours as needed for wheezing (Take 3 mLs (1.25 mg total) by nebulization every 6 (six) hours as needed for Wheezing)   albuterol 108 (90 Base) MCG/ACT inhaler Commonly known as: VENTOLIN HFA Inhale 2 puffs into the lungs every 6 (six) hours as needed for wheezing.   atorvastatin 20 MG tablet Commonly known as: LIPITOR Take 1 tablet (20 mg total) by mouth once daily   benzonatate 200 MG capsule Commonly known as: TESSALON Take 1 capsule (200 mg total) by mouth 3 (three) times daily as needed for cough for 7 days   bisoprolol-hydrochlorothiazide 5-6.25 MG tablet Commonly known as: ZIAC Take 1 tablet by mouth daily.   budesonide 0.5 MG/2ML nebulizer solution Commonly known as: PULMICORT USE 2 MILLILITERS (ML) BY NEBULIZATION 2 TIMES DAILY   Cyanocobalamin 500 MCG Subl Place 1,000 mcg under the tongue daily.   gabapentin 300 MG capsule Commonly known as: NEURONTIN Take 1 capsule (300 mg total) by mouth 2 (two) times daily.   glimepiride 2 MG tablet Commonly known as: AMARYL Take 1 tablet (2 mg total) by mouth daily with breakfast.   ipratropium-albuterol 0.5-2.5 (3) MG/3ML Soln Commonly known as: DUONEB Inhale 3 mLs by nebulization 4 (four) times daily as needed for wheezing.   loratadine 10 MG tablet Commonly known as: CLARITIN Take 10 mg by mouth daily.   magnesium oxide 400 MG tablet Commonly known as: MAG-OX Take 400 mg by mouth daily.   metFORMIN 1000 MG tablet Commonly known as: GLUCOPHAGE Take 1 tablet (1,000 mg total) by mouth 2 (two) times daily with a meal.   montelukast 10 MG tablet Commonly known as: SINGULAIR Take 1 tablet (10 mg total) by mouth at bedtime.   multivitamin tablet Take 1 tablet by mouth daily.   ONE  TOUCH ULTRA 2 w/Device Kit Use as instructed.   OneTouch Delica Plus Lancet33G Misc Use to test twice a day (1 each by Other route 2 (two) times daily.)   OneTouch Ultra Test test strip Generic drug: glucose blood Test twice a day (1 each by Other route 2 (two) times daily.)   Ozempic (1 MG/DOSE) 4 MG/3ML Sopn Generic drug: Semaglutide (1 MG/DOSE) Inject 0.75 mLs (1 mg total) subcutaneously once a week What changed: Another medication with the same name was removed. Continue taking this medication, and follow the directions you see here. Changed by: Michiel Cowboy   pantoprazole 40 MG tablet Commonly known as: PROTONIX Take 1 tablet (40 mg total) by mouth daily.   tamsulosin 0.4 MG Caps capsule Commonly known as: FLOMAX Take 1 capsule (0.4 mg total) by mouth daily.        Allergies: No Known  Allergies  Family History: Family History  Problem Relation Age of Onset   Hypertension Mother    COPD Father    Hypertension Father    Stroke Maternal Grandfather     Social History:  reports that he quit smoking about 37 years ago. His smoking use included cigarettes. He has been exposed to tobacco smoke. He has never used smokeless tobacco. He reports that he does not drink alcohol and does not use drugs.  ROS: Pertinent ROS in HPI  Physical Exam: BP 136/76   Pulse 86   Constitutional:  Well nourished. Alert and oriented, No acute distress. HEENT: [place] AT, moist mucus membranes.  Trachea midline Cardiovascular: No clubbing, cyanosis, or edema. Respiratory: Normal respiratory effort, no increased work of breathing. Neurologic: Grossly intact, no focal deficits, moving all 4 extremities. Psychiatric: Normal mood and affect.   Laboratory Data: Lipid Panel w/calc LDL Order: [id number] Component Ref Range & Units 1 mo ago  Cholesterol, Total 100 - 200 mg/dL 409  Triglyceride 35 - 199 mg/dL 76  HDL (High Density Lipoprotein) Cholesterol 29.0 - 71.0 mg/dL 81.1  LDL  Calculated 0 - 130 mg/dL 66  VLDL Cholesterol mg/dL 15  Cholesterol/HDL Ratio 2.8  Resulting Agency [facility]   Specimen Collected: 09/19/23 11:10   Performed by: [facility] Last Resulted: 09/19/23 13:48  Received From: [facility]  Result Received: 09/27/23 14:47   CBC w/auto Differential (5 Part) Order: [id number] Component Ref Range & Units 1 mo ago  WBC (White Blood Cell Count) 4.1 - 10.2 10^3/uL 8.1  RBC (Red Blood Cell Count) 4.69 - 6.13 10^6/uL 4.76  Hemoglobin 14.1 - 18.1 gm/dL 21.3 Low   Hematocrit 08.6 - 52.0 % 40.9  MCV (Mean Corpuscular Volume) 80.0 - 100.0 fl 85.9  MCH (Mean Corpuscular Hemoglobin) 27.0 - 31.2 pg 28.2  MCHC (Mean Corpuscular Hemoglobin Concentration) 32.0 - 36.0 gm/dL 57.8  Platelet Count 469 - 450 10^3/uL 265  RDW-CV (Red Cell Distribution Width) 11.6 - 14.8 % 14.2  MPV (Mean Platelet Volume) 9.4 - 12.4 fl 9.4  Neutrophils 1.50 - 7.80 10^3/uL 4.79  Lymphocytes 1.00 - 3.60 10^3/uL 1.85  Monocytes 0.00 - 1.50 10^3/uL 0.79  Eosinophils 0.00 - 0.55 10^3/uL 0.56 High   Basophils 0.00 - 0.09 10^3/uL 0.08  Neutrophil % 32.0 - 70.0 % 59.2  Lymphocyte % 10.0 - 50.0 % 22.9  Monocyte % 4.0 - 13.0 % 9.8  Eosinophil % 1.0 - 5.0 % 6.9 High   Basophil% 0.0 - 2.0 % 1  Immature Granulocyte % <=0.7 % 0.2  Immature Granulocyte Count <=0.06 10^3/L 0.02  Resulting Agency [facility]   Specimen Collected: 09/19/23 11:10   Performed by: [facility] Last Resulted: 09/19/23 12:11  Received From: [facility]  Result Received: 09/27/23 14:47    Comprehensive Metabolic Panel (CMP) Order: [id number] Component Ref Range & Units 1 mo ago  Glucose 70 - 110 mg/dL 97  Sodium 244 - 010 mmol/L 140  Potassium 3.6 - 5.1 mmol/L 4.5  Chloride 97 - 109 mmol/L 103  Carbon Dioxide (CO2) 22.0 - 32.0 mmol/L 27.5  Urea Nitrogen (BUN) 7 - 25 mg/dL 14   Creatinine 0.7 - 1.3 mg/dL 0.9  Glomerular Filtration Rate (eGFR) >60 mL/min/1.73sq m 92  Comment: CKD-EPI (2021) does not include patient's race in the calculation of eGFR.  Monitoring changes of plasma creatinine and eGFR over time is useful for monitoring  kidney function.  Interpretive Ranges for eGFR (CKD-EPI 2021):  eGFR:       >60 mL/min/1.73 sq. m - Normal eGFR:       30-59 mL/min/1.73 sq. m - Moderately Decreased eGFR:       15-29 mL/min/1.73 sq. m  - Severely Decreased eGFR:       < 15 mL/min/1.73 sq. m  - Kidney Failure   Note: These eGFR calculations do not apply in acute situations when eGFR is changing rapidly or patients on dialysis.  Calcium 8.7 - 10.3 mg/dL 9.6  AST 8 - 39 U/L 19  ALT 6 - 57 U/L 19  Alk Phos (alkaline Phosphatase) 34 - 104 U/L 53  Albumin 3.5 - 4.8 g/dL 4.2  Bilirubin, Total 0.3 - 1.2 mg/dL 0.7  Protein, Total 6.1 - 7.9 g/dL 7.1  A/G Ratio 1.0 - 5.0 gm/dL 1.4  Resulting Agency [facility]   Specimen Collected: 09/19/23 11:10   Performed by: [facility] Last Resulted: 09/19/23 13:48  Received From: [facility]  Result Received: 09/27/23 14:47    Hemoglobin A1C Order: [id number] Component Ref Range & Units 1 mo ago  Hemoglobin A1C 4.2 - 5.6 % 7 High   Average Blood Glucose (Calc) mg/dL 829  Resulting Agency [facility]  Narrative Performed by [facility] Normal Range:    4.2 - 5.6% Increased Risk:  5.7 - 6.4% Diabetes:        >= 6.5% Glycemic Control for adults with diabetes:  <7%    Specimen Collected: 09/19/23 11:10   Performed by: [facility] Last Resulted: 09/19/23 12:49  Received From: [facility]  Result Received: 09/27/23 14:47   Urinalysis w/Microscopic Order: [id number] Component Ref Range & Units 1 mo ago  Color Colorless, Straw, Light Yellow, Yellow, Dark Yellow Colorless  Clarity Clear Clear  Specific  Gravity 1.005 - 1.030 1.005  pH, Urine 5.0 - 8.0 5.5  Protein, Urinalysis Negative mg/dL Negative  Glucose, Urinalysis Negative mg/dL Negative  Ketones, Urinalysis Negative mg/dL Negative  Blood, Urinalysis Negative Negative  Nitrite, Urinalysis Negative Negative  Leukocyte Esterase, Urinalysis Negative Negative  Bilirubin, Urinalysis Negative Negative  Urobilinogen, Urinalysis 0.2 - 1.0 mg/dL 0.2  WBC, UA <=5 /hpf <1  Red Blood Cells, Urinalysis <=3 /hpf 0  Bacteria, Urinalysis 0 - 5 /hpf 0-5  Squamous Epithelial Cells, Urinalysis /hpf 0  Resulting Agency [facility]   Specimen Collected: 09/19/23 11:10   Performed by: [facility] Last Resulted: 09/19/23 12:41  Received From: [facility]  Result Received: 09/27/23 14:47  I have reviewed the labs.   Pertinent Imaging: N/A   Assessment & Plan:    1. rUTI's/persistent UTI -Resolved -Now the patient is no longer on Jardiance and he will complete his Macrobid prophylactic this month hopefully he will not have any further UTIs   2. BPH with LUTS -Recent cystoscopy demonstrated BPH -Continue tamsulosin 0.4 mg daily  Return in about 1 year (around 10/30/2024) for I PSS/PVR .  These notes generated with voice recognition software. I apologize for typographical errors.  Cloretta Ned  [facility] [address]  [address] [place], [place] [address] [phone]

## 2023-10-31 ENCOUNTER — Encounter: Payer: Self-pay | Admitting: Urology

## 2023-10-31 ENCOUNTER — Other Ambulatory Visit: Payer: Self-pay

## 2023-10-31 ENCOUNTER — Ambulatory Visit: Payer: PPO | Admitting: Urology

## 2023-10-31 VITALS — BP 136/76 | HR 86

## 2023-10-31 DIAGNOSIS — N138 Other obstructive and reflux uropathy: Secondary | ICD-10-CM

## 2023-10-31 DIAGNOSIS — N390 Urinary tract infection, site not specified: Secondary | ICD-10-CM | POA: Diagnosis not present

## 2023-10-31 DIAGNOSIS — N401 Enlarged prostate with lower urinary tract symptoms: Secondary | ICD-10-CM | POA: Diagnosis not present

## 2023-10-31 MED ORDER — TAMSULOSIN HCL 0.4 MG PO CAPS: 0.4000 mg | ORAL_CAPSULE | Freq: Every day | ORAL | 3 refills | Status: DC

## 2023-11-06 ENCOUNTER — Other Ambulatory Visit: Payer: Self-pay

## 2023-11-19 ENCOUNTER — Other Ambulatory Visit: Payer: Self-pay

## 2023-11-20 ENCOUNTER — Other Ambulatory Visit: Payer: Self-pay

## 2023-11-20 NOTE — Progress Notes (Unsigned)
 11/21/2023 12:25 PM   [name] 12/22/1955 [id number]  Referring provider: Marguarite Arbour, MD [address] [facility] [place],  [place] [address]  Urological history: 1. rUTI's -Contributing factors of age, diabetes and incomplete bladder emptying -non contrast CT abdomen (05/2023) -benign renal cyst -contrast CT pelvis (06/2023) -no prostate abscess  2. BPH with LU TS -PSA (02/2023) - 0.79 -cysto (08/2023) moderate lateral lobe enlargement, mild elevation of bladder neck and mild to moderate trabeculation -Tamsulosin 0.4 mg daily  No chief complaint on file.  HPI: Danny Maldonado is a 68 y.o. male who presents today for frequency.    Previous records reviewed.   At his visit on 08/02/2023, I PSS 13/6.  PVR 161 mL.  UA nitrate positive, greater than 30 WBCs, 3-10 RBCs, mucus threads present and many bacteria.   He continues to have issues with urgency, frequency and painful urination.  He had been without the Flomax for approximately 2 weeks but restarted on November 19.  He feels that he empties his bladder.  He states his symptoms did abate after he was on IV antibiotics but then quickly returned after he underwent his colonoscopy prep and the colonoscopy itself.  Patient denies any modifying or aggravating factors.  Patient denies any gross hematuria or suprapubic/flank pain.  Patient denies any fevers, chills, nausea or vomiting.   Cysto w/ BPH.    At his visit on 10/31/2023, He feels well today.  His urinalysis with his primary care physician in January was benign.  He has a few daily macrobid left and he will finish that prescription by the end of the month.   Patient denies any modifying or aggravating factors.  Patient denies any recent UTI's, gross hematuria, dysuria or suprapubic/flank pain.  Patient denies any fevers, chills, nausea or vomiting.  He has discontinue the Jardiance and upped the Ozempic w/ Dr. Judithann Sheen.  He does not want any bladder  outlet procedure unless its last option.  He contacted [place] via MyChart stating that on 03/18 he started to feel his symptoms return.  He is having more frequent urination (every hour to 1-1/2 hours) with slight pain (level 1-2). Today, March 19, is no different. He is not having a fever. He has seen no blood in my urine. His bladder feels empty after he urinates, but a few minutes later while standing, he feels that he has to urinate again. The volume of urine feels small.  He is still taking the Flomax, the Azo cranberry soft gels and a probiotic.      UA ***  PVR ***  PMH: Past Medical History:  Diagnosis Date   Actinic keratosis    Allergy    Atherosclerosis of abdominal aorta (HCC)    Cancer (HCC)    Coronary artery disease    Diabetes mellitus without complication (HCC)    DJD (degenerative joint disease)    Gastritis    GERD (gastroesophageal reflux disease)    Hx of adenomatous colonic polyps    Hyperlipidemia    Hypertension    Seasonal allergies    Sleep apnea    Squamous cell carcinoma of skin 06/08/2010   R temple    Surgical History: Past Surgical History:  Procedure Laterality Date   CARDIAC CATHETERIZATION     CHOLECYSTECTOMY     COLONOSCOPY WITH PROPOFOL N/A 09/26/2017   Procedure: COLONOSCOPY WITH PROPOFOL;  Surgeon: Scot Jun, MD;  Location: [facility] ENDOSCOPY;  Service: Endoscopy;  Laterality:  N/A;   COLONOSCOPY WITH PROPOFOL N/A 07/10/2023   Procedure: COLONOSCOPY WITH PROPOFOL;  Surgeon: Toledo, Boykin Nearing, MD;  Location: [facility] ENDOSCOPY;  Service: Gastroenterology;  Laterality: N/A;   Dental implant     ESOPHAGOGASTRODUODENOSCOPY     ESOPHAGOGASTRODUODENOSCOPY (EGD) WITH PROPOFOL N/A 07/10/2023   Procedure: ESOPHAGOGASTRODUODENOSCOPY (EGD) WITH PROPOFOL;  Surgeon: Toledo, Boykin Nearing, MD;  Location: [facility] ENDOSCOPY;  Service: Gastroenterology;  Laterality: N/A;   HERNIA REPAIR     POLYPECTOMY  07/10/2023   Procedure: POLYPECTOMY;  Surgeon: Toledo,  Boykin Nearing, MD;  Location: [facility] ENDOSCOPY;  Service: Gastroenterology;;   right trigger thumb release     VASECTOMY      Home Medications:  Allergies as of 11/21/2023   No Known Allergies      Medication List        Accurate as of November 20, 2023 12:25 PM. If you have any questions, ask your nurse or doctor.          acetaminophen 500 MG tablet Commonly known as: TYLENOL Take 1,000 mg by mouth every 6 (six) hours as needed (PRN - takes approximately once per week).   albuterol 1.25 MG/3ML nebulizer solution Commonly known as: ACCUNEB Use one vial via nebulizer every 6 hours as needed for wheezing (Take 3 mLs (1.25 mg total) by nebulization every 6 (six) hours as needed for Wheezing)   albuterol 108 (90 Base) MCG/ACT inhaler Commonly known as: VENTOLIN HFA Inhale 2 puffs into the lungs every 6 (six) hours as needed for wheezing.   atorvastatin 20 MG tablet Commonly known as: LIPITOR Take 1 tablet (20 mg total) by mouth once daily   benzonatate 200 MG capsule Commonly known as: TESSALON Take 1 capsule (200 mg total) by mouth 3 (three) times daily as needed for cough for 7 days   bisoprolol-hydrochlorothiazide 5-6.25 MG tablet Commonly known as: ZIAC Take 1 tablet by mouth daily.   budesonide 0.5 MG/2ML nebulizer solution Commonly known as: PULMICORT USE 2 MILLILITERS (ML) BY NEBULIZATION 2 TIMES DAILY   Cyanocobalamin 500 MCG Subl Place 1,000 mcg under the tongue daily.   gabapentin 300 MG capsule Commonly known as: NEURONTIN Take 1 capsule (300 mg total) by mouth 2 (two) times daily.   glimepiride 2 MG tablet Commonly known as: AMARYL Take 1 tablet (2 mg total) by mouth daily with breakfast.   ipratropium-albuterol 0.5-2.5 (3) MG/3ML Soln Commonly known as: DUONEB Inhale 3 mLs by nebulization 4 (four) times daily as needed for wheezing.   loratadine 10 MG tablet Commonly known as: CLARITIN Take 10 mg by mouth daily.   magnesium oxide 400 MG  tablet Commonly known as: MAG-OX Take 400 mg by mouth daily.   metFORMIN 1000 MG tablet Commonly known as: GLUCOPHAGE Take 1 tablet (1,000 mg total) by mouth 2 (two) times daily with a meal.   montelukast 10 MG tablet Commonly known as: SINGULAIR Take 1 tablet (10 mg total) by mouth at bedtime.   multivitamin tablet Take 1 tablet by mouth daily.   ONE TOUCH ULTRA 2 w/Device Kit Use as instructed.   OneTouch Delica Plus Lancet33G Misc Use to test twice a day (1 each by Other route 2 (two) times daily.)   OneTouch Ultra Test test strip Generic drug: glucose blood Test twice a day (1 each by Other route 2 (two) times daily.)   Ozempic (1 MG/DOSE) 4 MG/3ML Sopn Generic drug: Semaglutide (1 MG/DOSE) Inject 0.75 mLs (1 mg total) subcutaneously once a week   pantoprazole 40  MG tablet Commonly known as: PROTONIX Take 1 tablet (40 mg total) by mouth daily.   tamsulosin 0.4 MG Caps capsule Commonly known as: FLOMAX Take 1 capsule (0.4 mg total) by mouth daily.        Allergies: No Known Allergies  Family History: Family History  Problem Relation Age of Onset   Hypertension Mother    COPD Father    Hypertension Father    Stroke Maternal Grandfather     Social History:  reports that he quit smoking about 37 years ago. His smoking use included cigarettes. He has been exposed to tobacco smoke. He has never used smokeless tobacco. He reports that he does not drink alcohol and does not use drugs.  ROS: Pertinent ROS in HPI  Physical Exam: There were no vitals taken for this visit.  Constitutional:  Well nourished. Alert and oriented, No acute distress. HEENT: [place] AT, moist mucus membranes.  Trachea midline, no masses. Cardiovascular: No clubbing, cyanosis, or edema. Respiratory: Normal respiratory effort, no increased work of breathing. GI: Abdomen is soft, non tender, non distended, no abdominal masses. Liver and spleen not palpable.  No hernias appreciated.  Stool  sample for occult testing is not indicated.   GU: No CVA tenderness.  No bladder fullness or masses.  Patient with circumcised/uncircumcised phallus. ***Foreskin easily retracted***  Urethral meatus is patent.  No penile discharge. No penile lesions or rashes. Scrotum without lesions, cysts, rashes and/or edema.  Testicles are located scrotally bilaterally. No masses are appreciated in the testicles. Left and right epididymis are normal. Rectal: Patient with  normal sphincter tone. Anus and perineum without scarring or rashes. No rectal masses are appreciated. Prostate is approximately *** grams, *** nodules are appreciated. Seminal vesicles are normal. Skin: No rashes, bruises or suspicious lesions. Lymph: No cervical or inguinal adenopathy. Neurologic: Grossly intact, no focal deficits, moving all 4 extremities. Psychiatric: Normal mood and affect.   Laboratory Data: Urinalysis See EPIC and HPI I have reviewed the labs.  Pertinent Imaging: ***   Assessment & Plan:    1. Suspected UTI -UA grossly infected  -Urine culture pending -Started empirically on ***, will adjust if necessary once urine culture and sensitivity results are available    2. BPH with LUTS -Recent cystoscopy demonstrated BPH -Continue tamsulosin 0.4 mg daily -Discussed that he may want to consider a bladder outlet procedure as incomplete bladder emptying may be contributing to his recurrent urinary tract infections or start finasteride -We discussed the side effects of finasteride such as erectile dysfunction, fatigue, possible gynecomastia, increase in blood sugar and also that it takes up to 3 to 6 months to see effect with the medication ***  No follow-ups on file.  These notes generated with voice recognition software. I apologize for typographical errors.  Cloretta Ned  [facility] [address]  [address] [place], [place] [address] [phone]

## 2023-11-21 ENCOUNTER — Ambulatory Visit: Admitting: Urology

## 2023-11-21 ENCOUNTER — Other Ambulatory Visit: Payer: Self-pay

## 2023-11-21 ENCOUNTER — Encounter: Payer: Self-pay | Admitting: Urology

## 2023-11-21 VITALS — BP 124/80 | HR 75 | Ht 68.0 in | Wt 250.0 lb

## 2023-11-21 DIAGNOSIS — N138 Other obstructive and reflux uropathy: Secondary | ICD-10-CM | POA: Diagnosis not present

## 2023-11-21 DIAGNOSIS — N401 Enlarged prostate with lower urinary tract symptoms: Secondary | ICD-10-CM

## 2023-11-21 DIAGNOSIS — R3989 Other symptoms and signs involving the genitourinary system: Secondary | ICD-10-CM | POA: Diagnosis not present

## 2023-11-21 LAB — MICROSCOPIC EXAMINATION: WBC, UA: >30 /HPF — AB (ref 0–5)

## 2023-11-21 LAB — URINALYSIS, COMPLETE
Bilirubin, UA: NEGATIVE
Glucose, UA: NEGATIVE
Ketones, UA: NEGATIVE
Nitrite, UA: NEGATIVE
Protein,UA: NEGATIVE
Specific Gravity, UA: <1.005 — ABNORMAL LOW (ref 1.005–1.030)
Urobilinogen, Ur: 0.2 mg/dL (ref 0.2–1.0)
pH, UA: 6 (ref 5.0–7.5)

## 2023-11-21 LAB — BLADDER SCAN AMB NON-IMAGING

## 2023-11-21 MED ORDER — NITROFURANTOIN MONOHYD MACRO 100 MG PO CAPS
100.0000 mg | ORAL_CAPSULE | Freq: Two times a day (BID) | ORAL | 0 refills | Status: DC
  Filled 2023-11-21: qty 14, 7d supply, fill #0

## 2023-11-21 MED ORDER — TAMSULOSIN HCL 0.4 MG PO CAPS: 0.8000 mg | ORAL_CAPSULE | Freq: Every day | ORAL | 3 refills | Status: DC

## 2023-11-25 LAB — CULTURE, URINE COMPREHENSIVE

## 2023-12-04 ENCOUNTER — Other Ambulatory Visit: Payer: Self-pay

## 2023-12-05 ENCOUNTER — Other Ambulatory Visit: Payer: Self-pay

## 2023-12-13 ENCOUNTER — Ambulatory Visit: Payer: PPO | Admitting: Dermatology

## 2023-12-14 ENCOUNTER — Other Ambulatory Visit: Payer: Self-pay

## 2023-12-18 ENCOUNTER — Other Ambulatory Visit: Payer: Self-pay

## 2023-12-18 MED ORDER — PANTOPRAZOLE SODIUM 40 MG PO TBEC
40.0000 mg | DELAYED_RELEASE_TABLET | Freq: Every day | ORAL | 5 refills | Status: AC
  Filled 2023-12-18: qty 90, 90d supply, fill #0
  Filled 2024-03-19: qty 90, 90d supply, fill #1
  Filled 2024-06-19: qty 90, 90d supply, fill #2
  Filled 2024-09-16: qty 90, 90d supply, fill #3

## 2023-12-18 MED ORDER — MONTELUKAST SODIUM 10 MG PO TABS
10.0000 mg | ORAL_TABLET | Freq: Every evening | ORAL | 1 refills | Status: DC
  Filled 2023-12-18: qty 90, 90d supply, fill #0
  Filled 2024-03-31: qty 90, 90d supply, fill #1

## 2023-12-19 ENCOUNTER — Other Ambulatory Visit: Payer: Self-pay

## 2023-12-21 ENCOUNTER — Other Ambulatory Visit: Payer: Self-pay

## 2023-12-24 DIAGNOSIS — Z79899 Other long term (current) drug therapy: Secondary | ICD-10-CM | POA: Diagnosis not present

## 2023-12-24 DIAGNOSIS — E118 Type 2 diabetes mellitus with unspecified complications: Secondary | ICD-10-CM | POA: Diagnosis not present

## 2023-12-25 ENCOUNTER — Ambulatory Visit: Admitting: Dermatology

## 2023-12-25 ENCOUNTER — Encounter: Payer: Self-pay | Admitting: Dermatology

## 2023-12-25 DIAGNOSIS — L814 Other melanin hyperpigmentation: Secondary | ICD-10-CM

## 2023-12-25 DIAGNOSIS — W908XXA Exposure to other nonionizing radiation, initial encounter: Secondary | ICD-10-CM | POA: Diagnosis not present

## 2023-12-25 DIAGNOSIS — H61001 Unspecified perichondritis of right external ear: Secondary | ICD-10-CM | POA: Diagnosis not present

## 2023-12-25 DIAGNOSIS — Z872 Personal history of diseases of the skin and subcutaneous tissue: Secondary | ICD-10-CM | POA: Diagnosis not present

## 2023-12-25 DIAGNOSIS — L821 Other seborrheic keratosis: Secondary | ICD-10-CM | POA: Diagnosis not present

## 2023-12-25 DIAGNOSIS — L570 Actinic keratosis: Secondary | ICD-10-CM | POA: Diagnosis not present

## 2023-12-25 DIAGNOSIS — L578 Other skin changes due to chronic exposure to nonionizing radiation: Secondary | ICD-10-CM

## 2023-12-25 DIAGNOSIS — L918 Other hypertrophic disorders of the skin: Secondary | ICD-10-CM

## 2023-12-25 DIAGNOSIS — Z85828 Personal history of other malignant neoplasm of skin: Secondary | ICD-10-CM

## 2023-12-25 DIAGNOSIS — D1801 Hemangioma of skin and subcutaneous tissue: Secondary | ICD-10-CM | POA: Diagnosis not present

## 2023-12-25 DIAGNOSIS — Z8589 Personal history of malignant neoplasm of other organs and systems: Secondary | ICD-10-CM

## 2023-12-25 DIAGNOSIS — D229 Melanocytic nevi, unspecified: Secondary | ICD-10-CM

## 2023-12-25 DIAGNOSIS — Z1283 Encounter for screening for malignant neoplasm of skin: Secondary | ICD-10-CM | POA: Diagnosis not present

## 2023-12-25 NOTE — Progress Notes (Signed)
 Follow-Up Visit   Subjective  Danny Maldonado is a 68 y.o. male who presents for the following: Skin Cancer Screening and Full Body Skin Exam Hx of scc, hx of aks, hx of isks, hx of CNCH at right mid helix ear Reports some spots at temples b/l.   Bite reaction at right side of face   The patient presents for Total-Body Skin Exam (TBSE) for skin cancer screening and mole check. The patient has spots, moles and lesions to be evaluated, some may be new or changing and the patient may have concern these could be cancer.  The following portions of the chart were reviewed this encounter and updated as appropriate: medications, allergies, medical history  Review of Systems:  No other skin or systemic complaints except as noted in HPI or Assessment and Plan.  Objective  Well appearing patient in no apparent distress; mood and affect are within normal limits.  A full examination was performed including scalp, head, eyes, ears, nose, lips, neck, chest, axillae, abdomen, back, buttocks, bilateral upper extremities, bilateral lower extremities, hands, feet, fingers, toes, fingernails, and toenails. All findings within normal limits unless otherwise noted below.   Relevant physical exam findings are noted in the Assessment and Plan.  Right Ear x 2 (2) Erythematous thin papules/macules with gritty scale.   Assessment & Plan   SKIN CANCER SCREENING PERFORMED TODAY.  ACTINIC DAMAGE - Chronic condition, secondary to cumulative UV/sun exposure - diffuse scaly erythematous macules with underlying dyspigmentation - Recommend daily broad spectrum sunscreen SPF 30+ to sun-exposed areas, reapply every 2 hours as needed.  - Staying in the shade or wearing long sleeves, sun glasses (UVA+UVB protection) and wide brim hats (4-inch brim around the entire circumference of the hat) are also recommended for sun protection.  - Call for new or changing lesions.  LENTIGINES, SEBORRHEIC KERATOSES, HEMANGIOMAS -  Benign normal skin lesions - Benign-appearing - Call for any changes  MELANOCYTIC NEVI - Tan-brown and/or pink-flesh-colored symmetric macules and papules - Benign appearing on exam today - Observation - Call clinic for new or changing moles - Recommend daily use of broad spectrum spf 30+ sunscreen to sun-exposed areas.   Chondrodermatitis nodularis chronica helicis, right ear R mid ear helix Exam: Clear  Chondrodermatitis Nodularis Chronica Helicis (CNCH or CNH) is a common, benign inflammatory condition of the ear cartilage and overlying skin associated with very sensitive tender papule(s).  Trauma or pressure from sleeping on the ear or from cell phone use and sun damage may be exacerbating factors.  Treatment may include using a C-shaped airplane neck pillow for sleeping on the side of the head so no pressure is on the ear.  Other treatments include topical or intralesional steroids; liquid nitrogen or laser destruction; shave removal or excision.  The condition can be difficult to treat and persist or recur despite treatment.  Biopsy proven Chrondrodermatitis nodularis helicis of right ear  Clear. Observe for recurrence. Call clinic for new or changing lesions.  Recommend regular skin exams, daily broad-spectrum spf 30+ sunscreen use, and photoprotection.    Currently calm. Benign-appearing.  Observation.  Call clinic for new or changing lesions.  Recommend daily use of broad spectrum spf 30+ sunscreen to sun-exposed areas.   Acrochordons (Skin Tags) - Fleshy, skin-colored pedunculated papules - Benign appearing.  - Observe. - If desired, they can be removed with an in office procedure that is not covered by insurance. - Please call the clinic if you notice any new or changing lesions.  HISTORY OF SQUAMOUS CELL CARCINOMA OF THE SKIN Right temple 2011 - No evidence of recurrence today - No lymphadenopathy - Recommend regular full body skin exams - Recommend daily broad spectrum  sunscreen SPF 30+ to sun-exposed areas, reapply every 2 hours as needed.  - Call if any new or changing lesions are noted between office visits  HISTORY OF PRECANCEROUS ACTINIC KERATOSIS At right ear helix and right beard area  - site(s) of PreCancerous Actinic Keratosis clear today. - these may recur and new lesions may form requiring treatment to prevent transformation into skin cancer - observe for new or changing spots and contact [facility] for appointment if occur - photoprotection with sun protective clothing; sunglasses and broad spectrum sunscreen with SPF of at least 30 + and frequent self skin exams recommended - yearly exams by a dermatologist recommended for persons with history of PreCancerous Actinic Keratoses  ACTINIC KERATOSIS (2) Right Ear x 2 (2) Patient advised if not resolved in 2 months to call or send mychart   Actinic keratoses are precancerous spots that appear secondary to cumulative UV radiation exposure/sun exposure over time. They are chronic with expected duration over 1 year. A portion of actinic keratoses will progress to squamous cell carcinoma of the skin. It is not possible to reliably predict which spots will progress to skin cancer and so treatment is recommended to prevent development of skin cancer.  Recommend daily broad spectrum sunscreen SPF 30+ to sun-exposed areas, reapply every 2 hours as needed.  Recommend staying in the shade or wearing long sleeves, sun glasses (UVA+UVB protection) and wide brim hats (4-inch brim around the entire circumference of the hat). Call for new or changing lesions. Destruction of lesion - Right Ear x 2 (2) Complexity: simple   Destruction method: cryotherapy   Informed consent: discussed and consent obtained   Timeout:  patient name, date of birth, surgical site, and procedure verified Lesion destroyed using liquid nitrogen: Yes   Region frozen until ice ball extended beyond lesion: Yes   Outcome: patient  tolerated procedure well with no complications   Post-procedure details: wound care instructions given   Return in about 1 year (around 12/24/2024) for TBSE.  IRandee Busing, CMA, am acting as scribe for Celine Collard, MD.   Documentation: I have reviewed the above documentation for accuracy and completeness, and I agree with the above.  Celine Collard, MD

## 2023-12-25 NOTE — Patient Instructions (Addendum)
 if not resolved in 2 months to call or send mychart   Actinic keratoses are precancerous spots that appear secondary to cumulative UV radiation exposure/sun exposure over time. They are chronic with expected duration over 1 year. A portion of actinic keratoses will progress to squamous cell carcinoma of the skin. It is not possible to reliably predict which spots will progress to skin cancer and so treatment is recommended to prevent development of skin cancer.  Recommend daily broad spectrum sunscreen SPF 30+ to sun-exposed areas, reapply every 2 hours as needed.  Recommend staying in the shade or wearing long sleeves, sun glasses (UVA+UVB protection) and wide brim hats (4-inch brim around the entire circumference of the hat). Call for new or changing lesions.   Cryotherapy Aftercare  Wash gently with soap and water everyday.   Apply Vaseline and Band-Aid daily until healed.     Melanoma ABCDEs  Melanoma is the most dangerous type of skin cancer, and is the leading cause of death from skin disease.  You are more likely to develop melanoma if you: Have light-colored skin, light-colored eyes, or red or blond hair Spend a lot of time in the sun Tan regularly, either outdoors or in a tanning bed Have had blistering sunburns, especially during childhood Have a close family member who has had a melanoma Have atypical moles or large birthmarks  Early detection of melanoma is key since treatment is typically straightforward and cure rates are extremely high if we catch it early.   The first sign of melanoma is often a change in a mole or a new dark spot.  The ABCDE system is a way of remembering the signs of melanoma.  A for asymmetry:  The two halves do not match. B for border:  The edges of the growth are irregular. C for color:  A mixture of colors are present instead of an even brown color. D for diameter:  Melanomas are usually (but not always) greater than 6mm - the size of a pencil  eraser. E for evolution:  The spot keeps changing in size, shape, and color.  Please check your skin once per month between visits. You can use a small mirror in front and a large mirror behind you to keep an eye on the back side or your body.   If you see any new or changing lesions before your next follow-up, please call to schedule a visit.  Please continue daily skin protection including broad spectrum sunscreen SPF 30+ to sun-exposed areas, reapplying every 2 hours as needed when you're outdoors.   Staying in the shade or wearing long sleeves, sun glasses (UVA+UVB protection) and wide brim hats (4-inch brim around the entire circumference of the hat) are also recommended for sun protection.    Due to recent changes in healthcare laws, you may see results of your pathology and/or laboratory studies on MyChart before the doctors have had a chance to review them. We understand that in some cases there may be results that are confusing or concerning to you. Please understand that not all results are received at the same time and often the doctors may need to interpret multiple results in order to provide you with the best plan of care or course of treatment. Therefore, we ask that you please give us  2 business days to thoroughly review all your results before contacting the office for clarification. Should we see a critical lab result, you will be contacted sooner.   If You Need  Anything After Your Visit  If you have any questions or concerns for your doctor, please call our main line at [phone] and press option 4 to reach your doctor's medical assistant. If no one answers, please leave a voicemail as directed and we will return your call as soon as possible. Messages left after 4 pm will be answered the following business day.   You may also send us  a message via MyChart. We typically respond to MyChart messages within 1-2 business days.  For prescription refills, please ask your pharmacy  to contact our office. Our fax number is [phone].  If you have an urgent issue when the clinic is closed that cannot wait until the next business day, you can page your doctor at the number below.    Please note that while we do our best to be available for urgent issues outside of office hours, we are not available 24/7.   If you have an urgent issue and are unable to reach us , you may choose to seek medical care at your doctor's office, retail clinic, urgent care center, or emergency room.  If you have a medical emergency, please immediately call 911 or go to the emergency department.  Pager Numbers  - Dr. Bary Likes: [phone]  - Dr. Annette Barters: [phone]  - Dr. Felipe Horton: [phone]   In the event of inclement weather, please call our main line at [phone] for an update on the status of any delays or closures.  Dermatology Medication Tips: Please keep the boxes that topical medications come in in order to help keep track of the instructions about where and how to use these. Pharmacies typically print the medication instructions only on the boxes and not directly on the medication tubes.   If your medication is too expensive, please contact our office at [phone] option 4 or send us  a message through MyChart.   We are unable to tell what your co-pay for medications will be in advance as this is different depending on your insurance coverage. However, we may be able to find a substitute medication at lower cost or fill out paperwork to get insurance to cover a needed medication.   If a prior authorization is required to get your medication covered by your insurance company, please allow us  1-2 business days to complete this process.  Drug prices often vary depending on where the prescription is filled and some pharmacies may offer cheaper prices.  The website www.goodrx.com contains coupons for medications through different pharmacies. The prices here do not account for  what the cost may be with help from insurance (it may be cheaper with your insurance), but the website can give you the price if you did not use any insurance.  - You can print the associated coupon and take it with your prescription to the pharmacy.  - You may also stop by our office during regular business hours and pick up a GoodRx coupon card.  - If you need your prescription sent electronically to a different pharmacy, notify our office through [facility] or by phone at [phone] option 4.     Si Usted Necesita Algo Despus de Su Visita  Tambin puede enviarnos un mensaje a travs de Clinical cytogeneticist. Por lo general respondemos a los mensajes de MyChart en el transcurso de 1 a 2 das hbiles.  Para renovar recetas, por favor pida a su farmacia que se ponga en contacto con nuestra oficina. Franz Jacks de fax es Indios [phone].  Si tiene un  asunto urgente cuando la clnica est cerrada y que no puede esperar hasta el siguiente da hbil, puede llamar/localizar a su doctor(a) al nmero que aparece a continuacin.   Por favor, tenga en cuenta que aunque hacemos todo lo posible para estar disponibles para asuntos urgentes fuera del horario de [place], no estamos disponibles las 24 horas del da, los 7 [address]  [address] de la [place].   Si tiene un problema urgente y no puede comunicarse con nosotros, puede optar por buscar atencin mdica  en el consultorio de su doctor(a), en una clnica privada, en un centro de atencin urgente o en una sala de emergencias.  Si tiene Engineer, drilling, por favor llame inmediatamente al 911 o vaya a la sala de emergencias.  Nmeros de bper  - Dr. Bary Likes: [phone]  - Dra. Annette Barters: [phone]  - Dr. Felipe Horton: [phone]   En caso de inclemencias del tiempo, por favor llame a [place] principal al [phone] para una actualizacin sobre el [place] de cualquier retraso o cierre.  Consejos para la medicacin en dermatologa: Por favor, guarde  las cajas en las que vienen los medicamentos de uso tpico para ayudarle a seguir las instrucciones sobre dnde y cmo usarlos. Las farmacias generalmente imprimen las instrucciones del medicamento slo en las cajas y no directamente en los tubos del [place].   Si su medicamento es muy caro, por favor, pngase en contacto con Bettyjane Brunet llamando al [phone] y presione la opcin 4 o envenos un mensaje a travs de Clinical cytogeneticist.   No podemos decirle cul ser su copago por los medicamentos por adelantado ya que esto es diferente dependiendo de la cobertura de su seguro. Sin embargo, es posible que podamos encontrar un medicamento sustituto a Audiological scientist un formulario para que el seguro cubra el medicamento que se considera necesario.   Si se requiere una autorizacin previa para que su compaa de seguros [place] su medicamento, por favor permtanos de 1 a 2 das hbiles para completar este proceso.  Los precios de los medicamentos varan con frecuencia dependiendo del Environmental consultant de dnde se surte la receta y alguna farmacias pueden ofrecer precios ms baratos.  El sitio web www.goodrx.com tiene cupones para medicamentos de Health and safety inspector. Los precios aqu no tienen en cuenta lo que podra costar con la ayuda del seguro (puede ser ms barato con su seguro), pero el sitio web puede darle el precio si no utiliz Tourist information centre manager.  - Puede imprimir el cupn correspondiente y llevarlo con su receta a la farmacia.  - Tambin puede pasar por nuestra oficina durante el horario de atencin regular y Education officer, museum una tarjeta de cupones de GoodRx.  - Si necesita que su receta se enve electrnicamente a una farmacia diferente, informe a nuestra oficina a travs de MyChart de [place] o por telfono llamando al [phone] y presione la opcin 4.

## 2023-12-31 ENCOUNTER — Other Ambulatory Visit: Payer: Self-pay

## 2023-12-31 DIAGNOSIS — Z79899 Other long term (current) drug therapy: Secondary | ICD-10-CM | POA: Diagnosis not present

## 2023-12-31 DIAGNOSIS — Z125 Encounter for screening for malignant neoplasm of prostate: Secondary | ICD-10-CM | POA: Diagnosis not present

## 2023-12-31 DIAGNOSIS — E118 Type 2 diabetes mellitus with unspecified complications: Secondary | ICD-10-CM | POA: Diagnosis not present

## 2023-12-31 DIAGNOSIS — I10 Essential (primary) hypertension: Secondary | ICD-10-CM | POA: Diagnosis not present

## 2023-12-31 DIAGNOSIS — Z6839 Body mass index (BMI) 39.0-39.9, adult: Secondary | ICD-10-CM | POA: Diagnosis not present

## 2023-12-31 DIAGNOSIS — E782 Mixed hyperlipidemia: Secondary | ICD-10-CM | POA: Diagnosis not present

## 2023-12-31 DIAGNOSIS — E66812 Obesity, class 2: Secondary | ICD-10-CM | POA: Diagnosis not present

## 2023-12-31 MED ORDER — METFORMIN HCL 1000 MG PO TABS
1000.0000 mg | ORAL_TABLET | Freq: Two times a day (BID) | ORAL | 3 refills | Status: AC
  Filled 2023-12-31: qty 180, 90d supply, fill #0
  Filled 2024-04-10: qty 180, 90d supply, fill #1
  Filled 2024-07-08: qty 180, 90d supply, fill #2

## 2024-01-02 ENCOUNTER — Ambulatory Visit: Admitting: Urology

## 2024-01-02 ENCOUNTER — Encounter: Payer: Self-pay | Admitting: Urology

## 2024-01-02 ENCOUNTER — Other Ambulatory Visit: Payer: Self-pay

## 2024-01-02 VITALS — BP 125/77 | HR 73 | Ht 68.0 in | Wt 250.0 lb

## 2024-01-02 DIAGNOSIS — N401 Enlarged prostate with lower urinary tract symptoms: Secondary | ICD-10-CM | POA: Diagnosis not present

## 2024-01-02 DIAGNOSIS — R3989 Other symptoms and signs involving the genitourinary system: Secondary | ICD-10-CM | POA: Diagnosis not present

## 2024-01-02 DIAGNOSIS — N138 Other obstructive and reflux uropathy: Secondary | ICD-10-CM

## 2024-01-02 LAB — BLADDER SCAN AMB NON-IMAGING

## 2024-01-02 NOTE — Patient Instructions (Signed)

## 2024-01-02 NOTE — Progress Notes (Signed)
 I, Danny Maldonado, acting as a scribe for Danny Knapp, MD., have documented all relevant documentation on the behalf of Danny Knapp, MD, as directed by Danny Knapp, MD while in the presence of Danny Knapp, MD.  01/02/2024 3:48 PM   Danny Maldonado 1955/11/12 [id number]  Referring provider: Yehuda Helms, MD [address] [facility] [place],  [place] [address]  Chief Complaint  Patient presents with   Results   Urological history: 1. rUTI's -Contributing factors of age, diabetes and incomplete bladder emptying -non contrast CT abdomen (05/2023) -benign renal cyst -contrast CT pelvis (06/2023) -no prostate abscess   2. BPH with LUTS -PSA (02/2023) - 0.79 -Tamsulosin  0.4 mg daily  HPI: Danny Maldonado is a 68 y.o. male scheduled to discuss prostate outlet procedures.   Refer to Danny Maldonado's previous note, 11/21/23. At that visit, she titrated the tamsulosin  to 0.8 mg.  Patient states since increasing the tamsulosin , he has noted significant improvement in his voiding symptoms and states he is having no pain or discomfort  and is doing well. He feels like he is emptying his bladder better.  IPSS today 3/35   PMH: Past Medical History:  Diagnosis Date   Actinic keratosis    Allergy    Atherosclerosis of abdominal aorta (HCC)    Cancer (HCC)    Coronary artery disease    Diabetes mellitus without complication (HCC)    DJD (degenerative joint disease)    Gastritis    GERD (gastroesophageal reflux disease)    Hx of adenomatous colonic polyps    Hyperlipidemia    Hypertension    Seasonal allergies    Sleep apnea    Squamous cell carcinoma of skin 06/08/2010   R temple    Surgical History: Past Surgical History:  Procedure Laterality Date   CARDIAC CATHETERIZATION     CHOLECYSTECTOMY     COLONOSCOPY WITH PROPOFOL  N/A 09/26/2017   Procedure: COLONOSCOPY WITH PROPOFOL ;  Surgeon: Cassie Click, MD;  [facility]COPY;   Service: Endoscopy;  Laterality: N/A;   COLONOSCOPY WITH PROPOFOL  N/A 07/10/2023   Procedure: COLONOSCOPY WITH PROPOFOL ;  Surgeon: Toledo, Alphonsus Jeans, MD;  Location: [facility] ENDOSCOPY;  Service: Gastroenterology;  Laterality: N/A;   Dental implant     ESOPHAGOGASTRODUODENOSCOPY     ESOPHAGOGASTRODUODENOSCOPY (EGD) WITH PROPOFOL  N/A 07/10/2023   Procedure: ESOPHAGOGASTRODUODENOSCOPY (EGD) WITH PROPOFOL ;  Surgeon: Toledo, Alphonsus Jeans, MD;  Location: [facility] ENDOSCOPY;  Service: Gastroenterology;  Laterality: N/A;   HERNIA REPAIR     POLYPECTOMY  07/10/2023   Procedure: POLYPECTOMY;  Surgeon: Toledo, Teodoro K, MD;  Location: [facility] ENDOSCOPY;  Service: Gastroenterology;;   right trigger thumb release     VASECTOMY      Home Medications:  Allergies as of 01/02/2024   No Known Allergies      Medication List        Accurate as of Jan 02, 2024  3:48 PM. If you have any questions, ask your nurse or doctor.          acetaminophen  500 MG tablet Commonly known as: TYLENOL  Take 1,000 mg by mouth every 6 (six) hours as needed (PRN - takes approximately once per week).   albuterol  1.25 MG/3ML nebulizer solution Commonly known as: ACCUNEB  Use one vial via nebulizer every 6 hours as needed for wheezing (Take 3 mLs (1.25 mg total) by nebulization every 6 (six) hours as needed for Wheezing)   albuterol  108 (90 Base) MCG/ACT  inhaler Commonly known as: VENTOLIN  HFA Inhale 2 puffs into the lungs every 6 (six) hours as needed for wheezing.   atorvastatin  20 MG tablet Commonly known as: LIPITOR Take 1 tablet (20 mg total) by mouth once daily   bisoprolol -hydrochlorothiazide  5-6.25 MG tablet Commonly known as: ZIAC  Take 1 tablet by mouth daily.   budesonide  0.5 MG/2ML nebulizer solution Commonly known as: PULMICORT  USE 2 MILLILITERS (ML) BY NEBULIZATION 2 TIMES DAILY   Cyanocobalamin 500 MCG Subl Place 1,000 mcg under the tongue daily.   gabapentin  300 MG capsule Commonly known as:  NEURONTIN  Take 1 capsule (300 mg total) by mouth 2 (two) times daily.   glimepiride  2 MG tablet Commonly known as: AMARYL  Take 1 tablet (2 mg total) by mouth daily with breakfast.   ipratropium-albuterol  0.5-2.5 (3) MG/3ML Soln Commonly known as: DUONEB Inhale 3 mLs by nebulization 4 (four) times daily as needed for wheezing.   loratadine 10 MG tablet Commonly known as: CLARITIN Take 10 mg by mouth daily.   magnesium oxide 400 MG tablet Commonly known as: MAG-OX Take 400 mg by mouth daily.   metFORMIN  1000 MG tablet Commonly known as: GLUCOPHAGE  Take 1 tablet (1,000 mg total) by mouth 2 (two) times daily with a meal.   montelukast  10 MG tablet Commonly known as: SINGULAIR  Take 1 tablet (10 mg total) by mouth at bedtime.   multivitamin tablet Take 1 tablet by mouth daily.   nitrofurantoin  (macrocrystal-monohydrate) 100 MG capsule Commonly known as: MACROBID  Take 1 capsule (100 mg total) by mouth every 12 (twelve) hours.   ONE TOUCH ULTRA 2 w/Device Kit Use as instructed.   OneTouch Delica Plus Lancet33G Misc Use to test twice a day (1 each by Other route 2 (two) times daily.)   OneTouch Ultra Test test strip Generic drug: glucose blood Test twice a day (1 each by Other route 2 (two) times daily.)   Ozempic  (1 MG/DOSE) 4 MG/3ML Sopn Generic drug: Semaglutide  (1 MG/DOSE) Inject 0.75 mLs (1 mg total) subcutaneously once a week   pantoprazole  40 MG tablet Commonly known as: PROTONIX  Take 1 tablet (40 mg total) by mouth daily.   tamsulosin  0.4 MG Caps capsule Commonly known as: FLOMAX  Take 2 capsules (0.8 mg total) by mouth daily.        Allergies: No Known Allergies  Family History: Family History  Problem Relation Age of Onset   Hypertension Mother    COPD Father    Hypertension Father    Stroke Maternal Grandfather     Social History:  reports that he quit smoking about 38 years ago. His smoking use included cigarettes. He has been exposed to  tobacco smoke. He has never used smokeless tobacco. He reports that he does not drink alcohol and does not use drugs.   Physical Exam: BP 125/77   Pulse 73   Ht 5\' 8"  (1.727 m)   Wt 250 lb (113.4 kg)   BMI 38.01 kg/m   Constitutional:  Alert and oriented, No acute distress. HEENT: [place] AT Respiratory: Normal respiratory effort, no increased work of breathing. Psychiatric: Normal mood and affect.   Assessment & Plan:    1. BPH with LUTS Has noted significant improvement in his symptoms with the titration of tamsulosin  0.8 mg, he is not interested in surgical management at this time.  Based on his prostate size and cystoscopy, feel his best surgical option would be HoLEP, and he was provided literature if his symptoms worsen in the future.  PVR today  126 mL He has a follow-up scheduled with Matilde Son March 2026.  I have reviewed the above documentation for accuracy and completeness, and I agree with the above.   Danny Knapp, MD  [facility] [address], [address] [place], [place] [address] [phone]

## 2024-01-08 ENCOUNTER — Other Ambulatory Visit: Payer: Self-pay

## 2024-01-11 ENCOUNTER — Other Ambulatory Visit: Payer: Self-pay

## 2024-01-11 MED ORDER — OZEMPIC (1 MG/DOSE) 4 MG/3ML ~~LOC~~ SOPN
1.0000 mg | PEN_INJECTOR | SUBCUTANEOUS | 3 refills | Status: AC
  Filled 2024-01-11: qty 12, 112d supply, fill #0
  Filled 2024-04-30 – 2024-05-01 (×2): qty 12, 112d supply, fill #1
  Filled 2024-08-14: qty 12, 112d supply, fill #2

## 2024-01-16 ENCOUNTER — Other Ambulatory Visit: Payer: Self-pay

## 2024-01-17 ENCOUNTER — Other Ambulatory Visit: Payer: Self-pay

## 2024-01-18 ENCOUNTER — Other Ambulatory Visit: Payer: Self-pay

## 2024-01-18 DIAGNOSIS — B9689 Other specified bacterial agents as the cause of diseases classified elsewhere: Secondary | ICD-10-CM | POA: Diagnosis not present

## 2024-01-18 DIAGNOSIS — J019 Acute sinusitis, unspecified: Secondary | ICD-10-CM | POA: Diagnosis not present

## 2024-01-18 DIAGNOSIS — Z03818 Encounter for observation for suspected exposure to other biological agents ruled out: Secondary | ICD-10-CM | POA: Diagnosis not present

## 2024-01-18 MED ORDER — GABAPENTIN 300 MG PO CAPS
300.0000 mg | ORAL_CAPSULE | Freq: Two times a day (BID) | ORAL | 11 refills | Status: AC
  Filled 2024-01-18: qty 60, 30d supply, fill #0
  Filled 2024-02-16: qty 60, 30d supply, fill #1
  Filled 2024-04-27: qty 60, 30d supply, fill #2
  Filled 2024-06-24: qty 60, 30d supply, fill #3
  Filled 2024-09-16: qty 60, 30d supply, fill #4

## 2024-01-18 MED ORDER — AMOXICILLIN-POT CLAVULANATE 875-125 MG PO TABS
1.0000 | ORAL_TABLET | Freq: Two times a day (BID) | ORAL | 0 refills | Status: DC
  Filled 2024-01-18: qty 14, 7d supply, fill #0

## 2024-01-18 MED ORDER — PREDNISONE 20 MG PO TABS
20.0000 mg | ORAL_TABLET | Freq: Two times a day (BID) | ORAL | 0 refills | Status: DC
  Filled 2024-01-18: qty 10, 5d supply, fill #0

## 2024-01-18 MED ORDER — BENZONATATE 100 MG PO CAPS
100.0000 mg | ORAL_CAPSULE | Freq: Three times a day (TID) | ORAL | 0 refills | Status: DC
  Filled 2024-01-18: qty 20, 7d supply, fill #0

## 2024-01-31 ENCOUNTER — Encounter: Payer: Self-pay | Admitting: Urology

## 2024-02-01 ENCOUNTER — Other Ambulatory Visit

## 2024-02-01 ENCOUNTER — Other Ambulatory Visit: Payer: Self-pay | Admitting: *Deleted

## 2024-02-01 ENCOUNTER — Other Ambulatory Visit: Payer: Self-pay | Admitting: Urology

## 2024-02-01 ENCOUNTER — Ambulatory Visit: Payer: Self-pay | Admitting: Urology

## 2024-02-01 DIAGNOSIS — R3989 Other symptoms and signs involving the genitourinary system: Secondary | ICD-10-CM | POA: Diagnosis not present

## 2024-02-01 LAB — URINALYSIS, COMPLETE
Bilirubin, UA: NEGATIVE
Glucose, UA: NEGATIVE
Nitrite, UA: POSITIVE — AB
Specific Gravity, UA: 1.025 (ref 1.005–1.030)
Urobilinogen, Ur: 0.2 mg/dL (ref 0.2–1.0)
pH, UA: 6 (ref 5.0–7.5)

## 2024-02-01 LAB — MICROSCOPIC EXAMINATION: WBC, UA: >30 /HPF — AB (ref 0–5)

## 2024-02-01 MED ORDER — DOXYCYCLINE HYCLATE 100 MG PO CAPS
100.0000 mg | ORAL_CAPSULE | Freq: Two times a day (BID) | ORAL | 0 refills | Status: DC
Start: 2024-02-01 — End: 2024-02-22

## 2024-02-01 NOTE — Telephone Encounter (Signed)
 Notified patient as instructed, patient pleased. Discussed follow-up appointments, patient agrees

## 2024-02-03 ENCOUNTER — Other Ambulatory Visit: Payer: Self-pay

## 2024-02-04 ENCOUNTER — Other Ambulatory Visit: Payer: Self-pay

## 2024-02-04 MED ORDER — BUDESONIDE 0.5 MG/2ML IN SUSP
2.0000 mL | Freq: Two times a day (BID) | RESPIRATORY_TRACT | 5 refills | Status: AC
  Filled 2024-02-04: qty 120, 30d supply, fill #0
  Filled 2024-03-31: qty 120, 30d supply, fill #1

## 2024-02-04 MED ORDER — GLIMEPIRIDE 2 MG PO TABS
2.0000 mg | ORAL_TABLET | Freq: Every day | ORAL | 1 refills | Status: DC
  Filled 2024-02-04: qty 90, 90d supply, fill #0
  Filled 2024-05-16: qty 90, 90d supply, fill #1

## 2024-02-05 ENCOUNTER — Ambulatory Visit: Admitting: Urology

## 2024-02-05 VITALS — BP 129/75 | HR 78 | Ht 68.0 in | Wt 259.0 lb

## 2024-02-05 DIAGNOSIS — N390 Urinary tract infection, site not specified: Secondary | ICD-10-CM | POA: Diagnosis not present

## 2024-02-05 DIAGNOSIS — N138 Other obstructive and reflux uropathy: Secondary | ICD-10-CM

## 2024-02-05 DIAGNOSIS — J302 Other seasonal allergic rhinitis: Secondary | ICD-10-CM | POA: Insufficient documentation

## 2024-02-05 DIAGNOSIS — N401 Enlarged prostate with lower urinary tract symptoms: Secondary | ICD-10-CM

## 2024-02-05 DIAGNOSIS — R739 Hyperglycemia, unspecified: Secondary | ICD-10-CM | POA: Insufficient documentation

## 2024-02-05 DIAGNOSIS — K2970 Gastritis, unspecified, without bleeding: Secondary | ICD-10-CM | POA: Insufficient documentation

## 2024-02-05 LAB — BLADDER SCAN AMB NON-IMAGING: Scan Result: 32

## 2024-02-05 NOTE — Progress Notes (Signed)
   02/05/2024 2:06 PM   Fayette Hoot Dec 08, 1955 [id number]  Reason for visit: BPH and recurrent UTIs, discuss HOLEP  HPI: 68 year old male previously followed by Dr. Cherylene Corrente and Matilde Son, PA for BPH with incomplete emptying, obstructive urinary symptoms, and recurrent UTIs.  He has had multiple urinary infections/prostatitis, felt like he was improving on the 0.8 mg Flomax  dose, but recently had another infection in early June with urgency, frequency, dysuria, and culture documented E. coli.  CT scan shows no evidence of stones or other cause of recurrent infections. I personally viewed and interpreted the CT pelvis from November 2024 showing a 60 g prostate.  He was referred to discuss HOLEP.  We discussed the risks and benefits of HoLEP at length.  The procedure requires general anesthesia and takes 1 to 2 hours, and a holmium laser is used to enucleate the prostate and push this tissue into the bladder.  A morcellator is then used to remove this tissue, which is sent for pathology.  The vast majority(>95%) of patients are able to discharge the same day with a catheter in place for 2 to 3 days, and will follow-up in clinic for a voiding trial.  We specifically discussed the risks of bleeding, infection, retrograde ejaculation, temporary urgency and urge incontinence, very low risk of long-term incontinence, urethral stricture/bladder neck contracture, pathologic evaluation of prostate tissue and possible detection of prostate cancer or other malignancy, and possible need for additional procedures.  Schedule HOLEP  Lawerence Pressman, MD  [facility] [address], [address] [place], [place] [address] [phone]

## 2024-02-05 NOTE — Patient Instructions (Signed)

## 2024-02-06 ENCOUNTER — Other Ambulatory Visit: Payer: Self-pay

## 2024-02-06 DIAGNOSIS — N401 Enlarged prostate with lower urinary tract symptoms: Secondary | ICD-10-CM

## 2024-02-06 LAB — CULTURE, URINE COMPREHENSIVE

## 2024-02-06 NOTE — Progress Notes (Signed)
 Surgical Physician Order Form [facility] [place]  * Scheduling expectation : Next Available(wait at least 2 to 3 weeks from 6/6 when he was diagnosed with infection)  *Length of Case: 1.5 hours  *Clearance needed: no  *Anticoagulation Instructions: Hold all anticoagulants  *Aspirin Instructions: Hold Aspirin  *Post-op visit Date/Instructions:  1-3 day cath removal  *Diagnosis: BPH w/urinary obstruction  *Procedure:  HOLEP ([address])   Additional orders: N/A  -Admit type: OUTpatient  -Anesthesia: General  -VTE Prophylaxis Standing Order SCD's       Other:   -Standing Lab Orders Per Anesthesia    Lab other: UA&Urine Culture  -Standing Test orders EKG/Chest x-ray per Anesthesia       Test other:   - Medications: Zosyn IV  -Other orders:  N/A

## 2024-02-07 ENCOUNTER — Telehealth: Payer: Self-pay

## 2024-02-07 NOTE — Progress Notes (Signed)
   [facility] Surgical Posting Form  Surgery Date: Date: 02/22/2024  Surgeon: Dr. Jay Meth, MD  Inpt ( No  )   Outpt (Yes)   Obs ( No  )   Diagnosis: N40.1, N13.8 Benign Prostatic Hyperplasia with Urinary Obstruction  -CPT: [phone]  Surgery: Holmium Laser Enucleation of the Prostate  Stop Anticoagulations: Yes and also hold ASA  Cardiac/Medical/Pulmonary Clearance needed: no  *Orders entered into EPIC  Date: 02/07/24   *Case booked in EPIC  Date: 02/06/24  *Notified pt of Surgery: Date: 02/06/24  PRE-OP UA & CX: yes, will obtain in clinic on 02/15/2024  *Placed into Prior Authorization Work [place] Date: 02/07/24  Assistant/laser/rep:No  Patient is aware to hold OZEMPIC  for at least one week prior to surgery.

## 2024-02-07 NOTE — Telephone Encounter (Signed)
 Per Dr. Estanislao Heimlich, Patient is to be scheduled for Holmium Laser Enucleation of the Prostate   Mr. Krider was contacted and possible surgical dates were discussed, Friday June 27th, 2025 was agreed upon for surgery.   Patient was instructed that Dr. Estanislao Heimlich will require them to provide a pre-op UA & CX prior to surgery. This was ordered and scheduled drop off appointment was made for 02/15/2024.    Patient was directed to call [phone] between 1-3pm the day before surgery to find out surgical arrival time.  Instructions were given not to eat or drink from midnight on the night before surgery and have a driver for the day of surgery. On the surgery day patient was instructed to enter through the Medical Mall entrance of [facility] report the Same Day Surgery desk.   Pre-Admit Testing will be in contact via phone to set up an interview with the anesthesia team to review your history and medications prior to surgery.   Reminder of this information was sent via MyChart to the patient.

## 2024-02-13 ENCOUNTER — Telehealth: Payer: Self-pay

## 2024-02-13 ENCOUNTER — Other Ambulatory Visit: Payer: Self-pay

## 2024-02-13 MED ORDER — NITROFURANTOIN MONOHYD MACRO 100 MG PO CAPS
100.0000 mg | ORAL_CAPSULE | Freq: Two times a day (BID) | ORAL | 0 refills | Status: DC
  Filled 2024-02-13: qty 20, 10d supply, fill #0

## 2024-02-13 NOTE — Telephone Encounter (Signed)
-----   Message from Lawerence Pressman sent at 02/13/2024  8:12 AM EDT ----- Regarding: FW: Antibiotic recommendation Please start Macrobid  100 mg twice daily x 10 days, can start now, thanks.  Do not need to repeat urine prior to surgery  Jay Meth, MD 02/13/2024 ----- Message ----- From: Eartha Gold, MD Sent: 02/12/2024   4:10 PM EDT To: Geraline Knapp, MD; Lawerence Pressman, MD; Di# Subject: RE: Antibiotic recommendation                  Hi Dr Cherylene Corrente, not sure if my prior message went through about trying macrobid  for 10 days and repeat  ua and ucx as long as no signs of upper tract infection. ----- Message ----- From: Alica Inks, MD Sent: 02/07/2024  11:04 AM EDT To: Geraline Knapp, MD; Lawerence Pressman, MD; Da# Subject: RE: Antibiotic recommendation                  Hi, I am  Out of the [place] and Dr. Harwood Lingo is covering me. I am ccing him . He can address this on 6/16 as he is away today and tomorrow. Thanks Marijean Shouts ----- Message ----- From: Geraline Knapp, MD Sent: 02/07/2024  10:52 AM EDT To: Lawerence Pressman, MD; Alica Inks, # Subject: Antibiotic recommendation                      Good morning,  Patient with recurrent ESBL E. coli and has seen Dr. Francee Inch.  Scheduled for BPH outlet procedure 02/22/2024.  Presently with symptomatic UTI and urine culture MDR E. coli.  Do you have antibiotic recommendations?  Urine will need to be sterile preoperatively.  Thanks, [employer]

## 2024-02-13 NOTE — Telephone Encounter (Signed)
 Spoke with pt. Pt. Advised to start medication today. Medication sent to [facility] pharmacy. Patient advised that repeat lab culture appt has been cancelled. Patient verbalized understanding. Encouraged to call back with any questions or concerns.

## 2024-02-15 ENCOUNTER — Other Ambulatory Visit

## 2024-02-15 ENCOUNTER — Other Ambulatory Visit: Payer: Self-pay

## 2024-02-15 ENCOUNTER — Encounter
Admission: RE | Admit: 2024-02-15 | Discharge: 2024-02-15 | Disposition: A | Discharge status: Home / Self Care | Source: Ambulatory Visit | Attending: Urology | Admitting: Urology

## 2024-02-15 VITALS — Ht 68.0 in | Wt 250.0 lb

## 2024-02-15 DIAGNOSIS — E119 Type 2 diabetes mellitus without complications: Secondary | ICD-10-CM

## 2024-02-15 HISTORY — DX: Sciatica, unspecified side: M54.30

## 2024-02-15 HISTORY — DX: Bronchiectasis, uncomplicated: J47.9

## 2024-02-15 NOTE — Patient Instructions (Addendum)
 Your procedure is scheduled on: Friday 02/22/24 To find out your arrival time, please call [phone] between 1PM - 3PM on:   Thursday 02/21/24 Report to the Registration Desk on the 1st floor of the Medical Mall. Free Valet parking is available.  If your arrival time is 6:00 am, do not arrive before that time as the Medical Mall entrance doors do not open until 6:00 am.  REMEMBER: Instructions that are not followed completely may result in serious medical risk, up to and including death; or upon the discretion of your surgeon and anesthesiologist your surgery may need to be rescheduled.  Do not eat food or drink any liquids after midnight the night before surgery.  No gum chewing or hard candies.  One week prior to surgery: Stop Anti-inflammatories (NSAIDS) such as Advil, Aleve, Ibuprofen, Motrin, Naproxen, Naprosyn and Aspirin based products such as Excedrin, Goody's Powder, BC Powder. You may however, continue to take Tylenol  if needed for pain up until the day of surgery.  Stop ANY OVER THE COUNTER supplements and vitamins until after surgery. You can continue your Probiotic.  Continue taking all prescribed medications with the exception of the following: Ozempic , hold next dose (Thursday 02/21/24). Metformin  last dose will be Tuesday 02/19/24. Continue taking your Glymeperide.  TAKE ONLY THESE MEDICATIONS THE MORNING OF SURGERY WITH A SIP OF WATER:  loratadine (CLARITIN) 10 MG tablet  pantoprazole  (PROTONIX ) 40 MG tablet Antacid (take one the night before and one on the morning of surgery - helps to prevent nausea after surgery.) tamsulosin  (FLOMAX ) 0.4 MG CAPS capsule   Use your nebulizer prior to arrival on the day of surgery and bring your rescue inhaler  to the hospital.  No Alcohol for 24 hours before or after surgery.  No Smoking including e-cigarettes for 24 hours before surgery.  No chewable tobacco products for at least 6 hours before surgery.  No nicotine patches on  the day of surgery.  Do not use any recreational drugs for at least a week (preferably 2 weeks) before your surgery.  Please be advised that the combination of cocaine and anesthesia may have negative outcomes, up to and including death. If you test positive for cocaine, your surgery will be cancelled.  On the morning of surgery brush your teeth with toothpaste and water, you may rinse your mouth with mouthwash if you wish. Do not swallow any toothpaste or mouthwash.  Shower prior to arrival.  Do not wear lotions, powders, or perfumes or cologne.   Do not shave body hair from the neck down 48 hours before surgery.  Wear clean comfortable clothing (specific to your surgery type) to the hospital.  Do not wear jewelry, make-up, hairpins, clips or nail polish.  For welded (permanent) jewelry: bracelets, anklets, waist bands, etc.  Please have this removed prior to surgery.  If it is not removed, there is a chance that hospital personnel will need to cut it off on the day of surgery. Contact lenses, hearing aids and dentures may not be worn into surgery.  Bring your C-PAP to the hospital in case you may have to spend the night.   Do not bring valuables to the hospital. [facility] is not responsible for any missing/lost belongings or valuables.   Notify your doctor if there is any change in your medical condition (cold, fever, infection).  If you are being discharged the day of surgery, you will not be allowed to drive home. You will need a responsible individual to drive  you home and stay with you for 24 hours after surgery.   After surgery, you can help prevent lung complications by doing breathing exercises.  Take deep breaths and cough every 1-2 hours. Your doctor may order a device called an Incentive Spirometer to help you take deep breaths.  Surgery Visitation Policy:  Patients undergoing a surgery or procedure may have two family members or support persons with them as long  as the person is not COVID-19 positive or experiencing its symptoms.   Please call the Pre-admissions Testing Dept. at [phone] if you have any questions about these instructions.

## 2024-02-20 ENCOUNTER — Other Ambulatory Visit: Payer: Self-pay

## 2024-02-22 ENCOUNTER — Ambulatory Visit: Payer: Self-pay | Admitting: Urgent Care

## 2024-02-22 ENCOUNTER — Encounter
Admission: RE | Disposition: A | Discharge status: Home / Self Care | Payer: Self-pay | Source: Home / Self Care | Attending: Urology

## 2024-02-22 ENCOUNTER — Encounter: Payer: Self-pay | Admitting: Urology

## 2024-02-22 ENCOUNTER — Ambulatory Visit
Admission: RE | Admit: 2024-02-22 | Discharge: 2024-02-22 | Disposition: A | Discharge status: Home / Self Care | Attending: Urology | Admitting: Urology

## 2024-02-22 ENCOUNTER — Other Ambulatory Visit: Payer: Self-pay

## 2024-02-22 DIAGNOSIS — Z79899 Other long term (current) drug therapy: Secondary | ICD-10-CM | POA: Insufficient documentation

## 2024-02-22 DIAGNOSIS — Z7722 Contact with and (suspected) exposure to environmental tobacco smoke (acute) (chronic): Secondary | ICD-10-CM | POA: Diagnosis not present

## 2024-02-22 DIAGNOSIS — Z87440 Personal history of urinary (tract) infections: Secondary | ICD-10-CM

## 2024-02-22 DIAGNOSIS — N138 Other obstructive and reflux uropathy: Secondary | ICD-10-CM | POA: Insufficient documentation

## 2024-02-22 DIAGNOSIS — N390 Urinary tract infection, site not specified: Secondary | ICD-10-CM | POA: Diagnosis not present

## 2024-02-22 DIAGNOSIS — E119 Type 2 diabetes mellitus without complications: Secondary | ICD-10-CM | POA: Diagnosis not present

## 2024-02-22 DIAGNOSIS — N401 Enlarged prostate with lower urinary tract symptoms: Secondary | ICD-10-CM | POA: Insufficient documentation

## 2024-02-22 DIAGNOSIS — I10 Essential (primary) hypertension: Secondary | ICD-10-CM | POA: Diagnosis not present

## 2024-02-22 DIAGNOSIS — Z87891 Personal history of nicotine dependence: Secondary | ICD-10-CM | POA: Insufficient documentation

## 2024-02-22 DIAGNOSIS — K219 Gastro-esophageal reflux disease without esophagitis: Secondary | ICD-10-CM | POA: Insufficient documentation

## 2024-02-22 DIAGNOSIS — G4730 Sleep apnea, unspecified: Secondary | ICD-10-CM | POA: Diagnosis not present

## 2024-02-22 DIAGNOSIS — I2510 Atherosclerotic heart disease of native coronary artery without angina pectoris: Secondary | ICD-10-CM | POA: Diagnosis not present

## 2024-02-22 HISTORY — PX: HOLEP-LASER ENUCLEATION OF THE PROSTATE WITH MORCELLATION: SHX6641

## 2024-02-22 LAB — GLUCOSE, CAPILLARY
Glucose-Capillary: 134 mg/dL — ABNORMAL HIGH (ref 70–99)
Glucose-Capillary: 122 mg/dL — ABNORMAL HIGH (ref 70–99)

## 2024-02-22 SURGERY — ENUCLEATION, PROSTATE, USING LASER, WITH MORCELLATION
Anesthesia: General | Site: Prostate

## 2024-02-22 MED ORDER — ORAL CARE MOUTH RINSE: 15.0000 mL | Freq: Once | OROMUCOSAL | Status: AC

## 2024-02-22 MED ORDER — PIPERACILLIN-TAZOBACTAM 3.375 G IVPB
INTRAVENOUS | Status: AC
  Filled 2024-02-22: qty 50

## 2024-02-22 MED ORDER — OXYCODONE HCL 5 MG PO TABS
ORAL_TABLET | ORAL | Status: AC
  Filled 2024-02-22: qty 1

## 2024-02-22 MED ORDER — DEXAMETHASONE SODIUM PHOSPHATE 10 MG/ML IJ SOLN
INTRAMUSCULAR | Status: AC
  Filled 2024-02-22: qty 1

## 2024-02-22 MED ORDER — SODIUM CHLORIDE 0.9 % IR SOLN
Status: DC | PRN
Start: 2024-02-22 — End: 2024-02-22
  Administered 2024-02-22: 15000 mL

## 2024-02-22 MED ORDER — ACETAMINOPHEN 10 MG/ML IV SOLN
INTRAVENOUS | Status: AC
  Filled 2024-02-22: qty 100

## 2024-02-22 MED ORDER — PIPERACILLIN-TAZOBACTAM 3.375 G IVPB 30 MIN
3.3750 g | INTRAVENOUS | Status: AC
  Administered 2024-02-22: 3.375 g via INTRAVENOUS

## 2024-02-22 MED ORDER — OXYCODONE HCL 5 MG PO TABS
5.0000 mg | ORAL_TABLET | Freq: Once | ORAL | Status: AC | PRN
  Administered 2024-02-22: 5 mg via ORAL

## 2024-02-22 MED ORDER — LIDOCAINE HCL (PF) 2 % IJ SOLN
INTRAMUSCULAR | Status: AC
  Filled 2024-02-22: qty 5

## 2024-02-22 MED ORDER — ONDANSETRON HCL 4 MG/2ML IJ SOLN
INTRAMUSCULAR | Status: DC | PRN
  Administered 2024-02-22: 4 mg via INTRAVENOUS

## 2024-02-22 MED ORDER — SODIUM CHLORIDE 0.9 % IV SOLN: INTRAVENOUS | Status: DC

## 2024-02-22 MED ORDER — PROPOFOL 10 MG/ML IV BOLUS
INTRAVENOUS | Status: DC | PRN
  Administered 2024-02-22: 120 mg via INTRAVENOUS

## 2024-02-22 MED ORDER — TRAMADOL HCL 50 MG PO TABS
25.0000 mg | ORAL_TABLET | Freq: Four times a day (QID) | ORAL | 0 refills | Status: AC | PRN
  Filled 2024-02-22: qty 10, 3d supply, fill #0

## 2024-02-22 MED ORDER — FENTANYL CITRATE (PF) 100 MCG/2ML IJ SOLN: 25.0000 ug | INTRAMUSCULAR | Status: DC | PRN

## 2024-02-22 MED ORDER — ROCURONIUM BROMIDE 10 MG/ML (PF) SYRINGE
PREFILLED_SYRINGE | INTRAVENOUS | Status: AC
  Filled 2024-02-22: qty 10

## 2024-02-22 MED ORDER — DEXAMETHASONE SODIUM PHOSPHATE 10 MG/ML IJ SOLN
INTRAMUSCULAR | Status: DC | PRN
  Administered 2024-02-22: 5 mg via INTRAVENOUS

## 2024-02-22 MED ORDER — ROCURONIUM BROMIDE 100 MG/10ML IV SOLN
INTRAVENOUS | Status: DC | PRN
  Administered 2024-02-22: 60 mg via INTRAVENOUS

## 2024-02-22 MED ORDER — LIDOCAINE HCL (CARDIAC) PF 100 MG/5ML IV SOSY
PREFILLED_SYRINGE | INTRAVENOUS | Status: DC | PRN
  Administered 2024-02-22: 100 mg via INTRAVENOUS

## 2024-02-22 MED ORDER — CHLORHEXIDINE GLUCONATE 0.12 % MT SOLN
OROMUCOSAL | Status: AC
  Filled 2024-02-22: qty 15

## 2024-02-22 MED ORDER — BISOPROLOL FUMARATE 5 MG PO TABS
5.0000 mg | ORAL_TABLET | Freq: Once | ORAL | Status: AC
  Administered 2024-02-22: 5 mg via ORAL
  Filled 2024-02-22: qty 1

## 2024-02-22 MED ORDER — PROPOFOL 10 MG/ML IV BOLUS
INTRAVENOUS | Status: AC
Start: 2024-02-22 — End: 2024-02-22
  Filled 2024-02-22: qty 20

## 2024-02-22 MED ORDER — SUGAMMADEX SODIUM 200 MG/2ML IV SOLN
INTRAVENOUS | Status: DC | PRN
  Administered 2024-02-22: 400 mg via INTRAVENOUS

## 2024-02-22 MED ORDER — DROPERIDOL 2.5 MG/ML IJ SOLN: 0.6250 mg | Freq: Once | INTRAMUSCULAR | Status: DC | PRN

## 2024-02-22 MED ORDER — ONDANSETRON HCL 4 MG/2ML IJ SOLN
INTRAMUSCULAR | Status: AC
  Filled 2024-02-22: qty 2

## 2024-02-22 MED ORDER — ACETAMINOPHEN 10 MG/ML IV SOLN: 1000.0000 mg | Freq: Once | INTRAVENOUS | Status: DC | PRN

## 2024-02-22 MED ORDER — CHLORHEXIDINE GLUCONATE 0.12 % MT SOLN
15.0000 mL | Freq: Once | OROMUCOSAL | Status: AC
  Administered 2024-02-22: 15 mL via OROMUCOSAL

## 2024-02-22 MED ORDER — FENTANYL CITRATE (PF) 100 MCG/2ML IJ SOLN
INTRAMUSCULAR | Status: DC | PRN
  Administered 2024-02-22 (×2): 50 ug via INTRAVENOUS

## 2024-02-22 MED ORDER — PHENYLEPHRINE 80 MCG/ML (10ML) SYRINGE FOR IV PUSH (FOR BLOOD PRESSURE SUPPORT)
PREFILLED_SYRINGE | INTRAVENOUS | Status: DC | PRN
  Administered 2024-02-22: 80 ug via INTRAVENOUS

## 2024-02-22 MED ORDER — PHENYLEPHRINE 80 MCG/ML (10ML) SYRINGE FOR IV PUSH (FOR BLOOD PRESSURE SUPPORT)
PREFILLED_SYRINGE | INTRAVENOUS | Status: AC
  Filled 2024-02-22: qty 10

## 2024-02-22 MED ORDER — OXYCODONE HCL 5 MG/5ML PO SOLN: 5.0000 mg | Freq: Once | ORAL | Status: AC | PRN

## 2024-02-22 MED ORDER — FENTANYL CITRATE (PF) 100 MCG/2ML IJ SOLN
INTRAMUSCULAR | Status: AC
  Filled 2024-02-22: qty 2

## 2024-02-22 MED ORDER — ACETAMINOPHEN 10 MG/ML IV SOLN
INTRAVENOUS | Status: DC | PRN
Start: 2024-02-22 — End: 2024-02-22
  Administered 2024-02-22: 1000 mg via INTRAVENOUS

## 2024-02-22 SURGICAL SUPPLY — 25 items
ADAPTER IRRIG TUBE 2 SPIKE SOL (ADAPTER) ×2 IMPLANT
BAG URO DRAIN 4000ML (MISCELLANEOUS) ×1 IMPLANT
CATH URETL OPEN END 4X70 (CATHETERS) ×1 IMPLANT
CATH URTH STD 24FR FL 3W 2 (CATHETERS) ×1 IMPLANT
CONTAINER COLLECT MORCELLATR (MISCELLANEOUS) ×1 IMPLANT
DRAPE UTILITY 15X26 TOWEL STRL (DRAPES) IMPLANT
ELECTRD BIVAP BIPO 22/24 DONUT (ELECTROSURGICAL) IMPLANT
FIBER LASER MOSES 550 DFL (Laser) ×1 IMPLANT
FILTER OVERFLOW MORCELLATOR (FILTER) ×1 IMPLANT
GLOVE BIOGEL PI IND STRL 7.5 (GLOVE) ×1 IMPLANT
GOWN STRL REUS W/ TWL LRG LVL3 (GOWN DISPOSABLE) ×1 IMPLANT
GOWN STRL REUS W/ TWL XL LVL3 (GOWN DISPOSABLE) ×1 IMPLANT
HOLDER FOLEY CATH W/STRAP (MISCELLANEOUS) ×1 IMPLANT
KIT TURNOVER CYSTO (KITS) ×1 IMPLANT
MEMBRANE SLNG YLW 17 FOR INST (MISCELLANEOUS) ×1 IMPLANT
MORCELLATOR ROTATION 4.75 335 (MISCELLANEOUS) ×1 IMPLANT
PACK CYSTO AR (MISCELLANEOUS) ×1 IMPLANT
SET CYSTO W/LG BORE CLAMP LF (SET/KITS/TRAYS/PACK) ×1 IMPLANT
SET IRRIG Y TYPE TUR BLADDER L (SET/KITS/TRAYS/PACK) ×1 IMPLANT
SLEEVE PROTECTION STRL DISP (MISCELLANEOUS) ×2 IMPLANT
SOL .9 NS 3000ML IRR UROMATIC (IV SOLUTION) ×5 IMPLANT
SURGILUBE 2OZ TUBE FLIPTOP (MISCELLANEOUS) ×1 IMPLANT
SYRINGE TOOMEY IRRIG 70ML (MISCELLANEOUS) ×1 IMPLANT
TUBE PUMP MORCELLATOR PIRANHA (TUBING) ×1 IMPLANT
WATER STERILE IRR 1000ML POUR (IV SOLUTION) ×1 IMPLANT

## 2024-02-22 NOTE — Op Note (Signed)
 Date of procedure: 02/22/24  Preoperative diagnosis:  BPH with obstruction Recurrent UTI  Postoperative diagnosis:  Same  Procedure: HoLEP (Holmium Laser Enucleation of the Prostate)  Surgeon: Redell Burnet, MD  Anesthesia: General  Complications: None  Intraoperative findings:  Small to moderate size prostate, moderate to severe bladder trabeculations, no suspicious lesions Uncomplicated HOLEP, ureteral orifices and verumontanum intact at conclusion of case  EBL: Minimal  Specimens: Prostate chips  Enucleation time: 14 minutes  Morcellation time: 3 minutes  Intra-op weight: 22g  Drains: 24 French three-way, 40 cc in balloon  Indication: Danny Maldonado is a 68 y.o. patient with BPH and obstruction with recurrent infections who opted for HOLEP.  After reviewing the management options for treatment, they elected to proceed with the above surgical procedure(s). We have discussed the potential benefits and risks of the procedure, side effects of the proposed treatment, the likelihood of the patient achieving the goals of the procedure, and any potential problems that might occur during the procedure or recuperation.  We specifically discussed the risks of bleeding, infection, hematuria and clot retention, need for additional procedures, possible overnight hospital stay, temporary urgency and incontinence, rare long-term incontinence, and retrograde ejaculation.  Informed consent has been obtained.   Description of procedure:  The patient was taken to the operating room and general anesthesia was induced.  The patient was placed in the dorsal lithotomy position, prepped and draped in the usual sterile fashion, and preoperative antibiotics(Zosyn) were administered.  SCDs were placed for DVT prophylaxis.  A preoperative time-out was performed.   Fleeta Needs sounds were used to gently dilated the urethra up to 58F. The 76 French continuous flow resectoscope was inserted into the  urethra using the visual obturator  The prostate was moderate in size with lateral lobe hypertrophy. The bladder was thoroughly inspected and notable for moderate to severe trabeculations but no suspicious lesions.  The ureteral orifices were located in orthotopic position.    The laser was set to 2 J and 60 Hz and early apical release was performed by making a circumferential mucosal incision proximal to the sphincter.  A lambda incision was then made proximal to the verumontanum.  The prostate was enucleated en bloc circumferentially into the bladder.  The capsule was examined and laser was used for meticulous hemostasis.    The 16 French resectoscope was then switched out for the 26 French nephroscope and prostate tissue was morcellated(Piranha) and the tissue sent to pathology.  A 24 French three-way catheter was inserted easily with the aid of a catheter guide, and 40 cc were placed in the balloon.  Urine was clear.  The catheter irrigated easily with a Toomey syringe.  CBI was initiated.   The patient tolerated the procedure well without any immediate complications and was extubated and transferred to the recovery room in stable condition.  Urine was clear on last CBI.  Disposition: Stable to PACU  Plan: Wean CBI in PACU, anticipate discharge home today with Foley removal in clinic in 2-3 days  Redell Burnet, MD 02/22/2024

## 2024-02-22 NOTE — Transfer of Care (Signed)
 Immediate Anesthesia Transfer of Care Note  Patient: Danny Maldonado  Procedure(s) Performed: ENUCLEATION, PROSTATE, USING LASER, WITH MORCELLATION (Prostate)  Patient Location: PACU  Anesthesia Type:General  Level of Consciousness: awake, alert , and oriented  Airway & Oxygen Therapy: Patient Spontanous Breathing and Patient connected to face mask oxygen  Post-op Assessment: Report given to RN and Post -op Vital signs reviewed and stable  Post vital signs: Reviewed and stable  Last Vitals:  Vitals Value Taken Time  BP 116/68 02/22/24 10:26  Temp 36.1 1026  Pulse 71 02/22/24 10:29  Resp 20 02/22/24 10:29  SpO2 99 % 02/22/24 10:29  Vitals shown include unfiled device data.  Last Pain:  Vitals:   02/22/24 0748  TempSrc: Temporal  PainSc: 0-No pain         Complications: No notable events documented.

## 2024-02-22 NOTE — Anesthesia Postprocedure Evaluation (Signed)
 Anesthesia Post Note  Patient: Danny Maldonado  Procedure(s) Performed: ENUCLEATION, PROSTATE, USING LASER, WITH MORCELLATION (Prostate)  Patient location during evaluation: PACU Anesthesia Type: General Level of consciousness: awake and alert Pain management: pain level controlled Vital Signs Assessment: post-procedure vital signs reviewed and stable Respiratory status: spontaneous breathing, nonlabored ventilation and respiratory function stable Cardiovascular status: blood pressure returned to baseline and stable Postop Assessment: no apparent nausea or vomiting Anesthetic complications: no   No notable events documented.   Last Vitals:  Vitals:   02/22/24 1115 02/22/24 1140  BP: 120/64 132/72  Pulse: 73 69  Resp: 14 17  Temp: (!) 36.3 C (!) 36.2 C  SpO2: 99% 100%    Last Pain:  Vitals:   02/22/24 1140  TempSrc: Temporal  PainSc: 0-No pain                 Camellia Merilee Louder

## 2024-02-22 NOTE — Anesthesia Preprocedure Evaluation (Signed)
 Anesthesia Evaluation  Patient identified by MRN, date of birth, ID band Patient awake    Reviewed: Allergy & Precautions, NPO status , Patient's Chart, lab work & pertinent test results  History of Anesthesia Complications Negative for: history of anesthetic complications  Airway Mallampati: III  TM Distance: >3 FB Neck ROM: full    Dental no notable dental hx.    Pulmonary shortness of breath and with exertion, sleep apnea and Continuous Positive Airway Pressure Ventilation , former smoker   Pulmonary exam normal        Cardiovascular Exercise Tolerance: Good hypertension, On Medications and On Home Beta Blockers (-) angina + CAD  Normal cardiovascular exam     Neuro/Psych negative neurological ROS  negative psych ROS   GI/Hepatic Neg liver ROS,GERD  Controlled,,  Endo/Other  diabetes, Type 2    Renal/GU negative Renal ROS  negative genitourinary   Musculoskeletal   Abdominal  (+) + obese  Peds  Hematology negative hematology ROS (+)   Anesthesia Other Findings Past Medical History: No date: Actinic keratosis No date: Allergy No date: Atherosclerosis of abdominal aorta (HCC) No date: Cancer (HCC) No date: Coronary artery disease No date: Diabetes mellitus without complication (HCC) No date: DJD (degenerative joint disease) No date: Gastritis No date: GERD (gastroesophageal reflux disease) No date: Hx of adenomatous colonic polyps No date: Hyperlipidemia No date: Hypertension No date: Seasonal allergies No date: Sleep apnea 06/08/2010: Squamous cell carcinoma of skin     Comment:  R temple  Past Surgical History: No date: CARDIAC CATHETERIZATION No date: CHOLECYSTECTOMY 09/26/2017: COLONOSCOPY WITH PROPOFOL ; N/A     Comment:  Procedure: COLONOSCOPY WITH PROPOFOL ;  Surgeon: Viktoria Lamar DASEN, MD;  Location: [facility] ENDOSCOPY;  Service:               Endoscopy;  Laterality: N/A; No date:  Dental implant No date: ESOPHAGOGASTRODUODENOSCOPY No date: HERNIA REPAIR No date: right trigger thumb release No date: VASECTOMY  BMI    Body Mass Index: 38.01 kg/m      Reproductive/Obstetrics negative OB ROS                              Anesthesia Physical Anesthesia Plan  ASA: 3  Anesthesia Plan: General   Post-op Pain Management: Minimal or no pain anticipated   Induction: Intravenous  PONV Risk Score and Plan: 1 and Propofol  infusion and TIVA  Airway Management Planned: Oral ETT  Additional Equipment:   Intra-op Plan:   Post-operative Plan: Extubation in OR  Informed Consent: I have reviewed the patients History and Physical, chart, labs and discussed the procedure including the risks, benefits and alternatives for the proposed anesthesia with the patient or authorized representative who has indicated his/her understanding and acceptance.     Dental Advisory Given  Plan Discussed with: Anesthesiologist, CRNA and Surgeon  Anesthesia Plan Comments:         Anesthesia Quick Evaluation

## 2024-02-22 NOTE — H&P (Signed)
 02/22/24 9:21 AM   Danny Maldonado Danny Maldonado [birth year] [id number]  CC: BPH, recurrent UTI  HPI:  68 year old male previously followed by Dr. Twylla and Clotilda Cornwall, PA for BPH with incomplete emptying, obstructive urinary symptoms, and recurrent UTIs.  He has had multiple urinary infections/prostatitis, felt like he was improving on the 0.8 mg Flomax  dose, but recently had another infection in early June with urgency, frequency, dysuria, and culture documented E. coli.  CT scan shows no evidence of stones or other cause of recurrent infections. I personally viewed and interpreted the CT pelvis from November 2024 showing a 60 g prostate.  He was referred to discuss HOLEP.   PMH: Past Medical History:  Diagnosis Date   Actinic keratosis    Allergy    Atherosclerosis of abdominal aorta (HCC)    Bronchiectasis (HCC)    Cancer (HCC)    Coronary artery disease    Diabetes mellitus without complication (HCC)    DJD (degenerative joint disease)    Gastritis    GERD (gastroesophageal reflux disease)    Hx of adenomatous colonic polyps    Hyperlipidemia    Hypertension    Sciatica    Seasonal allergies    Sleep apnea    Squamous cell carcinoma of skin 06/08/2010   R temple    Surgical History: Past Surgical History:  Procedure Laterality Date   CARDIAC CATHETERIZATION     CHOLECYSTECTOMY     COLONOSCOPY WITH PROPOFOL  N/A 09/26/2017   Procedure: COLONOSCOPY WITH PROPOFOL ;  Surgeon: Viktoria Lamar DASEN, MD;  Location[facility] ENDOSCOPY;  Service: Endoscopy;  Laterality: N/A;   COLONOSCOPY WITH PROPOFOL  N/A 07/10/2023   Procedure: COLONOSCOPY WITH PROPOFOL ;  Surgeon: Toledo, Ladell POUR, MD;  Location: [facility] ENDOSCOPY;  Service: Gastroenterology;  Laterality: N/A;   Dental implant     ESOPHAGOGASTRODUODENOSCOPY     ESOPHAGOGASTRODUODENOSCOPY (EGD) WITH PROPOFOL  N/A 07/10/2023   Procedure: ESOPHAGOGASTRODUODENOSCOPY (EGD) WITH PROPOFOL ;  Surgeon: Toledo, Ladell POUR, MD;  Location: [facility]  ENDOSCOPY;  Service: Gastroenterology;  Laterality: N/A;   HERNIA REPAIR     1997  2003 umbilical   POLYPECTOMY  07/10/2023   Procedure: POLYPECTOMY;  Surgeon: Toledo, Ladell POUR, MD;  Location: [facility] ENDOSCOPY;  Service: Gastroenterology;;   right trigger thumb release     VASECTOMY        Family History: Family History  Problem Relation Age of Onset   Hypertension Mother    COPD Father    Hypertension Father    Stroke Maternal Grandfather     Social History:  reports that he quit smoking about 38 years ago. His smoking use included cigarettes. He has been exposed to tobacco smoke. He has never used smokeless tobacco. He reports that he does not drink alcohol and does not use drugs.  Physical Exam: BP 129/78   Pulse 70   Temp (!) 97.2 F (36.2 C) (Temporal)   Resp 15   Ht 5' 8 (1.727 m)   Wt 113.4 kg   SpO2 97%   BMI 38.01 kg/m    Constitutional:  Alert and oriented, No acute distress. Cardiovascular: Regular rate and rhythm Respiratory: Clear to auscultation bilaterally GI: Abdomen is soft, nontender, nondistended, no abdominal masses   Laboratory Data: Culture 6/6 E. coli, treated with culture appropriate nitrofurantoin   Assessment & Plan:   68 year old male with obstructive urinary symptoms, recurrent UTI who opted for HOLEP for definitive treatment.  We discussed the risks and benefits of HoLEP at length.  The procedure requires general anesthesia  and takes 1 to 2 hours, and a holmium laser is used to enucleate the prostate and push this tissue into the bladder.  A morcellator is then used to remove this tissue, which is sent for pathology.  The vast majority(>95%) of patients are able to discharge the same day with a catheter in place for 2 to 3 days, and will follow-up in clinic for a voiding trial.  We specifically discussed the risks of bleeding, infection, retrograde ejaculation, temporary urgency and urge incontinence, very low risk of long-term incontinence,  urethral stricture/bladder neck contracture, pathologic evaluation of prostate tissue and possible detection of prostate cancer or other malignancy, and possible need for additional procedures.  HOLEP today   Redell Burnet, MD 02/22/2024  [facility] [address] [place], [place] [address] [phone]

## 2024-02-22 NOTE — Anesthesia Procedure Notes (Signed)
 Procedure Name: Intubation Date/Time: 02/22/2024 9:46 AM  Performed by: Jackye Spanner, CRNAPre-anesthesia Checklist: Patient identified, Patient being monitored, Timeout performed, Emergency Drugs available and Suction available Patient Re-evaluated:Patient Re-evaluated prior to induction Oxygen Delivery Method: Circle system utilized Preoxygenation: Pre-oxygenation with 100% oxygen Induction Type: IV induction Ventilation: Two handed mask ventilation required and Mask ventilation without difficulty Laryngoscope Size: 3 and McGrath Grade View: Grade I Tube type: Oral Tube size: 7.0 mm Number of attempts: 1 Airway Equipment and Method: Stylet Placement Confirmation: ETT inserted through vocal cords under direct vision, positive ETCO2 and breath sounds checked- equal and bilateral Secured at: 23 cm Tube secured with: Tape Dental Injury: Teeth and Oropharynx as per pre-operative assessment  Comments: Smooth atraumatic intubation, no complications noted

## 2024-02-23 ENCOUNTER — Encounter: Payer: Self-pay | Admitting: Urology

## 2024-02-25 ENCOUNTER — Ambulatory Visit: Payer: Self-pay | Admitting: Urology

## 2024-02-25 ENCOUNTER — Encounter: Payer: Self-pay | Admitting: Physician Assistant

## 2024-02-25 ENCOUNTER — Ambulatory Visit (INDEPENDENT_AMBULATORY_CARE_PROVIDER_SITE_OTHER): Admitting: Physician Assistant

## 2024-02-25 VITALS — BP 132/80 | HR 77 | Ht 68.0 in | Wt 250.0 lb

## 2024-02-25 DIAGNOSIS — N138 Other obstructive and reflux uropathy: Secondary | ICD-10-CM

## 2024-02-25 DIAGNOSIS — N401 Enlarged prostate with lower urinary tract symptoms: Secondary | ICD-10-CM

## 2024-02-25 LAB — SURGICAL PATHOLOGY

## 2024-02-25 NOTE — Progress Notes (Signed)
 Catheter Removal  Patient is present today for a catheter removal.  38ml of water was drained from the balloon. A 24FR three-way foley cath was removed from the bladder, no complications were noted. Patient tolerated well.  Performed by: Sagan Maselli, PA-C    Additional notes: Counseled patient on normal postoperative findings including dysuria, gross hematuria, and urinary urgency/leakage. Counseled patient to begin Kegel exercises 3x10 sets daily to increase urinary control and wear absorbent products as needed for security. Written and verbal resources provided today. Surgical pathology pending; will defer to Dr. Francisca to share results when available.  Follow up: Return in about 4 months (around 06/26/2024) for Postop f/u with Dr. Francisca.

## 2024-02-25 NOTE — Patient Instructions (Signed)

## 2024-03-26 DIAGNOSIS — J45909 Unspecified asthma, uncomplicated: Secondary | ICD-10-CM | POA: Diagnosis not present

## 2024-03-31 ENCOUNTER — Other Ambulatory Visit: Payer: Self-pay

## 2024-03-31 MED ORDER — BISOPROLOL-HYDROCHLOROTHIAZIDE 5-6.25 MG PO TABS
1.0000 | ORAL_TABLET | Freq: Every day | ORAL | 1 refills | Status: DC
  Filled 2024-03-31: qty 90, 90d supply, fill #0
  Filled 2024-07-02: qty 90, 90d supply, fill #1

## 2024-04-02 ENCOUNTER — Telehealth: Payer: Self-pay

## 2024-04-02 NOTE — Telephone Encounter (Signed)
 Patient called complaining of pain with urination now and then since surgery in June. No open appointments.  Please advise

## 2024-04-02 NOTE — Telephone Encounter (Signed)
 Spoke with patient regarding dysuria.  He is taking AZO but still experiencing intermittent burning.  Appointment scheduled.  He is going to get pyridium and see if that helps.

## 2024-04-03 ENCOUNTER — Other Ambulatory Visit: Payer: Self-pay

## 2024-04-03 DIAGNOSIS — J301 Allergic rhinitis due to pollen: Secondary | ICD-10-CM | POA: Diagnosis not present

## 2024-04-03 DIAGNOSIS — Z6838 Body mass index (BMI) 38.0-38.9, adult: Secondary | ICD-10-CM | POA: Diagnosis not present

## 2024-04-03 DIAGNOSIS — E66812 Obesity, class 2: Secondary | ICD-10-CM | POA: Diagnosis not present

## 2024-04-03 DIAGNOSIS — G4733 Obstructive sleep apnea (adult) (pediatric): Secondary | ICD-10-CM | POA: Diagnosis not present

## 2024-04-03 MED ORDER — TRELEGY ELLIPTA 100-62.5-25 MCG/ACT IN AEPB
1.0000 | INHALATION_SPRAY | Freq: Every day | RESPIRATORY_TRACT | 2 refills | Status: AC
  Filled 2024-04-03: qty 180, 90d supply, fill #0
  Filled 2024-08-02: qty 180, 90d supply, fill #1

## 2024-04-07 ENCOUNTER — Other Ambulatory Visit: Payer: Self-pay

## 2024-04-08 ENCOUNTER — Other Ambulatory Visit: Payer: Self-pay

## 2024-04-09 DIAGNOSIS — I10 Essential (primary) hypertension: Secondary | ICD-10-CM | POA: Diagnosis not present

## 2024-04-09 DIAGNOSIS — Z125 Encounter for screening for malignant neoplasm of prostate: Secondary | ICD-10-CM | POA: Diagnosis not present

## 2024-04-09 DIAGNOSIS — E118 Type 2 diabetes mellitus with unspecified complications: Secondary | ICD-10-CM | POA: Diagnosis not present

## 2024-04-09 DIAGNOSIS — E782 Mixed hyperlipidemia: Secondary | ICD-10-CM | POA: Diagnosis not present

## 2024-04-09 DIAGNOSIS — D649 Anemia, unspecified: Secondary | ICD-10-CM | POA: Diagnosis not present

## 2024-04-09 DIAGNOSIS — R8290 Unspecified abnormal findings in urine: Secondary | ICD-10-CM | POA: Diagnosis not present

## 2024-04-09 DIAGNOSIS — Z79899 Other long term (current) drug therapy: Secondary | ICD-10-CM | POA: Diagnosis not present

## 2024-04-13 ENCOUNTER — Other Ambulatory Visit: Payer: Self-pay

## 2024-04-13 MED ORDER — NITROFURANTOIN MONOHYD MACRO 100 MG PO CAPS
100.0000 mg | ORAL_CAPSULE | Freq: Two times a day (BID) | ORAL | 0 refills | Status: AC
  Filled 2024-04-13: qty 14, 7d supply, fill #0

## 2024-04-14 ENCOUNTER — Other Ambulatory Visit: Payer: Self-pay

## 2024-04-16 ENCOUNTER — Other Ambulatory Visit: Payer: Self-pay

## 2024-04-16 DIAGNOSIS — Z0000 Encounter for general adult medical examination without abnormal findings: Secondary | ICD-10-CM | POA: Diagnosis not present

## 2024-04-16 DIAGNOSIS — E66812 Obesity, class 2: Secondary | ICD-10-CM | POA: Diagnosis not present

## 2024-04-16 DIAGNOSIS — E782 Mixed hyperlipidemia: Secondary | ICD-10-CM | POA: Diagnosis not present

## 2024-04-16 DIAGNOSIS — G4733 Obstructive sleep apnea (adult) (pediatric): Secondary | ICD-10-CM | POA: Diagnosis not present

## 2024-04-16 DIAGNOSIS — I10 Essential (primary) hypertension: Secondary | ICD-10-CM | POA: Diagnosis not present

## 2024-04-16 DIAGNOSIS — Z6839 Body mass index (BMI) 39.0-39.9, adult: Secondary | ICD-10-CM | POA: Diagnosis not present

## 2024-04-16 DIAGNOSIS — I2510 Atherosclerotic heart disease of native coronary artery without angina pectoris: Secondary | ICD-10-CM | POA: Diagnosis not present

## 2024-04-16 DIAGNOSIS — E118 Type 2 diabetes mellitus with unspecified complications: Secondary | ICD-10-CM | POA: Diagnosis not present

## 2024-04-16 DIAGNOSIS — Z1331 Encounter for screening for depression: Secondary | ICD-10-CM | POA: Diagnosis not present

## 2024-04-16 MED ORDER — TIZANIDINE HCL 4 MG PO TABS
4.0000 mg | ORAL_TABLET | Freq: Every day | ORAL | 0 refills | Status: AC
  Filled 2024-04-16: qty 10, 10d supply, fill #0

## 2024-04-21 ENCOUNTER — Other Ambulatory Visit: Payer: Self-pay

## 2024-04-21 MED ORDER — OZEMPIC (1 MG/DOSE) 4 MG/3ML ~~LOC~~ SOPN: 1.0000 mg | PEN_INJECTOR | SUBCUTANEOUS | 3 refills | Status: AC

## 2024-04-23 ENCOUNTER — Encounter: Payer: Self-pay | Admitting: Physician Assistant

## 2024-04-23 ENCOUNTER — Ambulatory Visit (INDEPENDENT_AMBULATORY_CARE_PROVIDER_SITE_OTHER): Admitting: Physician Assistant

## 2024-04-23 VITALS — BP 104/64 | HR 69 | Ht 68.0 in | Wt 254.2 lb

## 2024-04-23 DIAGNOSIS — N401 Enlarged prostate with lower urinary tract symptoms: Secondary | ICD-10-CM | POA: Diagnosis not present

## 2024-04-23 DIAGNOSIS — N99115 Postprocedural fossa navicularis urethral stricture: Secondary | ICD-10-CM

## 2024-04-23 DIAGNOSIS — N138 Other obstructive and reflux uropathy: Secondary | ICD-10-CM | POA: Diagnosis not present

## 2024-04-23 DIAGNOSIS — N390 Urinary tract infection, site not specified: Secondary | ICD-10-CM

## 2024-04-23 LAB — URINALYSIS, COMPLETE
Bilirubin, UA: NEGATIVE
Glucose, UA: NEGATIVE
Ketones, UA: NEGATIVE
Nitrite, UA: NEGATIVE
RBC, UA: NEGATIVE
Specific Gravity, UA: 1.03 (ref 1.005–1.030)
Urobilinogen, Ur: 0.2 mg/dL (ref 0.2–1.0)
pH, UA: 6 (ref 5.0–7.5)

## 2024-04-23 LAB — MICROSCOPIC EXAMINATION

## 2024-04-23 LAB — BLADDER SCAN AMB NON-IMAGING

## 2024-04-23 NOTE — Progress Notes (Signed)
 04/23/2024 3:14 PM   Danny Maldonado Danny Maldonado [id number]  CC: Chief Complaint  Patient presents with   Benign prostatic hyperplasia with urinary obstruction   HPI: Danny Maldonado is a 68 y.o. male with PMH BPH s/p HOLEP with Dr. Francisca on 02/22/2024 and recurrent UTI who presents today for evaluation of dysuria.   He had some dysuria about 2 weeks ago.  He saw Danny Maldonado, and UA appeared grossly positive.  Urine culture grew ESBL E. coli and he was treated with culture appropriate Macrobid  x 7 days.  Today he states that his dysuria has largely resolved.  He has some occasional slight stinging/burning with termination.  In-office UA today positive for 1+ protein and trace leukocytes; urine microscopy with 11-30 WBCs/HPF and moderate bacteria. PVR 0mL.  PMH: Past Medical History:  Diagnosis Date   Actinic keratosis    Allergy    Atherosclerosis of abdominal aorta (HCC)    Bronchiectasis (HCC)    Cancer (HCC)    Coronary artery disease    Diabetes mellitus without complication (HCC)    DJD (degenerative joint disease)    Gastritis    GERD (gastroesophageal reflux disease)    Hx of adenomatous colonic polyps    Hyperlipidemia    Hypertension    Sciatica    Seasonal allergies    Sleep apnea    Squamous cell carcinoma of skin 06/08/2010   R temple    Surgical History: Past Surgical History:  Procedure Laterality Date   CARDIAC CATHETERIZATION     CHOLECYSTECTOMY     COLONOSCOPY WITH PROPOFOL  N/A 09/26/2017   Procedure: COLONOSCOPY WITH PROPOFOL ;  Surgeon: Viktoria Lamar DASEN, MD;  Location: [facility] ENDOSCOPY;  Service: Endoscopy;  Laterality: N/A;   COLONOSCOPY WITH PROPOFOL  N/A 07/10/2023   Procedure: COLONOSCOPY WITH PROPOFOL ;  Surgeon: Toledo, Ladell POUR, MD;  Location: [facility] ENDOSCOPY;  Service: Gastroenterology;  Laterality: N/A;   Dental implant     ESOPHAGOGASTRODUODENOSCOPY     ESOPHAGOGASTRODUODENOSCOPY (EGD) WITH PROPOFOL  N/A 07/10/2023   Procedure:  ESOPHAGOGASTRODUODENOSCOPY (EGD) WITH PROPOFOL ;  Surgeon: Toledo, Ladell POUR, MD;  Location: [facility] ENDOSCOPY;  Service: Gastroenterology;  Laterality: N/A;   HERNIA REPAIR     1997  2003 umbilical   HOLEP-LASER ENUCLEATION OF THE PROSTATE WITH MORCELLATION N/A 02/22/2024   Procedure: ENUCLEATION, PROSTATE, USING LASER, WITH MORCELLATION;  Surgeon: Danny Redell BROCKS, MD;  Location: [facility] ORS;  Service: Urology;  Laterality: N/A;   POLYPECTOMY  07/10/2023   Procedure: POLYPECTOMY;  Surgeon: Toledo, Teodoro K, MD;  Location: [facility] ENDOSCOPY;  Service: Gastroenterology;;   right trigger thumb release     VASECTOMY      Home Medications:  Allergies as of 04/23/2024   No Known Allergies      Medication List        Accurate as of April 23, 2024  3:14 PM. If you have any questions, ask your nurse or doctor.          acetaminophen  500 MG tablet Commonly known as: TYLENOL  Take 1,000 mg by mouth every 6 (six) hours as needed (PRN - takes approximately once per week).   albuterol  108 (90 Base) MCG/ACT inhaler Commonly known as: VENTOLIN  HFA Inhale 2 puffs into the lungs every 6 (six) hours as needed for wheezing.   atorvastatin  20 MG tablet Commonly known as: LIPITOR Take 1 tablet (20 mg total) by mouth once daily   AZO CRANBERRY PO Take 2 tablets by mouth daily in the afternoon.   bisoprolol -hydrochlorothiazide  5-6.25  MG tablet Commonly known as: ZIAC  Take 1 tablet by mouth daily.   budesonide  0.5 MG/2ML nebulizer solution Commonly known as: PULMICORT  Inhale 2 mLs (1 vial) into the lungs 2 (two) times daily. What changed: when to take this   cetirizine 10 MG tablet Commonly known as: ZYRTEC Take 10 mg by mouth daily.   Cyanocobalamin 500 MCG Subl Place 1,000 mcg under the tongue daily.   gabapentin  300 MG capsule Commonly known as: NEURONTIN  Take 1 capsule (300 mg total) by mouth 2 (two) times daily. What changed: when to take this   glimepiride  2 MG tablet Commonly  known as: AMARYL  Take 1 tablet (2 mg total) by mouth daily with breakfast.   ipratropium-albuterol  0.5-2.5 (3) MG/3ML Soln Commonly known as: DUONEB Inhale 3 mLs by nebulization 4 (four) times daily as needed for wheezing. What changed: when to take this   loratadine 10 MG tablet Commonly known as: CLARITIN Take 10 mg by mouth daily.   magnesium oxide 400 MG tablet Commonly known as: MAG-OX Take 400 mg by mouth daily.   metFORMIN  1000 MG tablet Commonly known as: GLUCOPHAGE  Take 1 tablet (1,000 mg total) by mouth 2 (two) times daily with a meal.   montelukast  10 MG tablet Commonly known as: SINGULAIR  Take 1 tablet (10 mg total) by mouth at bedtime.   multivitamin tablet Take 1 tablet by mouth daily.   nitrofurantoin  (macrocrystal-monohydrate) 100 MG capsule Commonly known as: MACROBID  Take 1 capsule (100 mg total) by mouth 2 (two) times daily for 7 days   ONE TOUCH ULTRA 2 w/Device Kit Use as instructed.   OneTouch Delica Plus Lancet33G Misc Use to test twice a day (1 each by Other route 2 (two) times daily.)   OneTouch Ultra test strip Generic drug: glucose blood Test twice a day (1 each by Other route 2 (two) times daily.)   Ozempic  (1 MG/DOSE) 4 MG/3ML Sopn Generic drug: Semaglutide  (1 MG/DOSE) Inject 1 mg into the skin once a week.   Ozempic  (1 MG/DOSE) 4 MG/3ML Sopn Generic drug: Semaglutide  (1 MG/DOSE) Inject 1 mg into the skin once a week.   pantoprazole  40 MG tablet Commonly known as: PROTONIX  Take 1 tablet (40 mg total) by mouth daily.   PROBIOTIC PO Take 1 capsule by mouth daily.   tiZANidine  4 MG tablet Commonly known as: ZANAFLEX  Take 1 tablet (4 mg total) by mouth at bedtime for 10 days.   Trelegy Ellipta  100-62.5-25 MCG/ACT Aepb Generic drug: Fluticasone -Umeclidin-Vilant Inhale 1 puff into the lungs daily.        Allergies:  No Known Allergies  Family History: Family History  Problem Relation Age of Onset   Hypertension Mother     COPD Father    Hypertension Father    Stroke Maternal Grandfather     Social History:   reports that he quit smoking about 38 years ago. His smoking use included cigarettes. He has been exposed to tobacco smoke. He has never used smokeless tobacco. He reports that he does not drink alcohol and does not use drugs.  Physical Exam: BP 104/64 (BP Location: Left Arm, Patient Position: Sitting, Cuff Size: Large)   Pulse 69   Ht 5' 8 (1.727 m)   Wt 254 lb 3.2 oz (115.3 kg)   BMI 38.65 kg/m   Constitutional:  Alert and oriented, no acute distress, nontoxic appearing HEENT: [place], AT Cardiovascular: No clubbing, cyanosis, or edema Respiratory: Normal respiratory effort, no increased work of breathing Skin: No rashes, bruises or  suspicious lesions Neurologic: Grossly intact, no focal deficits, moving all 4 extremities Psychiatric: Normal mood and affect  Laboratory Data: Results for orders placed or performed in visit on 04/23/24  Microscopic Examination   Collection Time: 04/23/24  2:22 PM   Urine  Result Value Ref Range   WBC, UA 11-30 (A) 0 - 5 /hpf   RBC, Urine 0-2 0 - 2 /hpf   Epithelial Cells (non renal) 0-10 0 - 10 /hpf   Mucus, UA Present (A) Not Estab.   Bacteria, UA Moderate (A) None seen/Few  Urinalysis, Complete   Collection Time: 04/23/24  2:22 PM  Result Value Ref Range   Specific Gravity, UA 1.030 1.005 - 1.030   pH, UA 6.0 5.0 - 7.5   Color, UA Yellow Yellow   Appearance Ur Clear Clear   Leukocytes,UA Trace (A) Negative   Protein,UA 1+ (A) Negative/Trace   Glucose, UA Negative Negative   Ketones, UA Negative Negative   RBC, UA Negative Negative   Bilirubin, UA Negative Negative   Urobilinogen, Ur 0.2 0.2 - 1.0 mg/dL   Nitrite, UA Negative Negative   Microscopic Examination See below:   Bladder Scan (Post Void Residual) in office   Collection Time: 04/23/24  3:02 PM  Result Value Ref Range   Scan Result 0ml    Simple Catheter Placement  Due to concern  for fossa navicularis stricture patient is present today for distal urethral sounding.  Patient was cleaned and prepped in a sterile fashion with betadine. A 22 FR silicone coud foley catheter was inserted to a depth of approximately 3 cm.  There was significant resistance with insertion, however ultimately I was able to pass the catheter freely.  The catheter was subsequently removed.  Patient tolerated well.    Performed by: Maylene Crocker, PA-C   Assessment & Plan:   1. Recurrent UTI (Primary) UA rather bland today after completing 7 days of culture appropriate antibiotics.  He is emptying appropriately.  Will repeat culture given persistent pyuria, though suspect this was secondary to #2 below and his postop status. - Urinalysis, Complete - Bladder Scan (Post Void Residual) in office - CULTURE, URINE COMPREHENSIVE  2. Postprocedural stricture of urethra at fossa navicularis Due to reports of some dysuria with termination and persistent pyuria on UA today, I had a suspicion for fossa navicularis stricture.  I sounded his distal urethra in clinic today and this was consistent with stricture.  Ultimately, I was able to get this open and the 22 French silicone coud catheter ultimately passed easily.  We discussed that he may have some burning at the tip of his penis for the next couple of days due to this procedure.  Return for Keep follow-up as scheduled.  Lucie Hones, PA-C  [facility] [place] [address], [address] [place], [place] [address] [phone]

## 2024-04-24 ENCOUNTER — Other Ambulatory Visit: Payer: Self-pay

## 2024-04-26 LAB — CULTURE, URINE COMPREHENSIVE

## 2024-04-30 ENCOUNTER — Other Ambulatory Visit: Payer: Self-pay

## 2024-04-30 MED ORDER — GLUCOSE BLOOD VI STRP
1.0000 | ORAL_STRIP | Freq: Two times a day (BID) | 3 refills | Status: AC
  Filled 2024-04-30: qty 200, 100d supply, fill #0

## 2024-05-01 ENCOUNTER — Other Ambulatory Visit: Payer: Self-pay

## 2024-05-02 ENCOUNTER — Ambulatory Visit: Payer: Self-pay | Admitting: Physician Assistant

## 2024-05-24 ENCOUNTER — Other Ambulatory Visit: Payer: Self-pay

## 2024-05-26 ENCOUNTER — Other Ambulatory Visit: Payer: Self-pay

## 2024-05-26 MED ORDER — ATORVASTATIN CALCIUM 20 MG PO TABS
20.0000 mg | ORAL_TABLET | Freq: Every day | ORAL | 3 refills | Status: AC
  Filled 2024-05-26: qty 90, 90d supply, fill #0
  Filled 2024-08-17: qty 90, 90d supply, fill #1

## 2024-05-26 MED ORDER — ALBUTEROL SULFATE HFA 108 (90 BASE) MCG/ACT IN AERS
2.0000 | INHALATION_SPRAY | Freq: Four times a day (QID) | RESPIRATORY_TRACT | 2 refills | Status: AC | PRN
  Filled 2024-05-26: qty 6.7, 25d supply, fill #0

## 2024-06-03 ENCOUNTER — Other Ambulatory Visit: Payer: Self-pay

## 2024-06-03 MED ORDER — FLUZONE HIGH-DOSE 0.5 ML IM SUSY
0.5000 mL | PREFILLED_SYRINGE | Freq: Once | INTRAMUSCULAR | 0 refills | Status: AC
  Filled 2024-06-03: qty 0.5, 1d supply, fill #0

## 2024-06-05 ENCOUNTER — Other Ambulatory Visit: Payer: Self-pay

## 2024-06-05 MED ORDER — ONETOUCH DELICA PLUS LANCET33G MISC
Freq: Four times a day (QID) | 3 refills | Status: AC
  Filled 2024-06-05: qty 200, 50d supply, fill #0
  Filled 2024-08-02: qty 200, 50d supply, fill #1

## 2024-06-09 ENCOUNTER — Other Ambulatory Visit: Payer: Self-pay

## 2024-06-09 MED ORDER — COMIRNATY 30 MCG/0.3ML IM SUSY
0.3000 mL | PREFILLED_SYRINGE | Freq: Once | INTRAMUSCULAR | 0 refills | Status: AC
  Filled 2024-06-09: qty 0.3, 1d supply, fill #0

## 2024-06-24 ENCOUNTER — Other Ambulatory Visit: Payer: Self-pay

## 2024-06-24 MED ORDER — MONTELUKAST SODIUM 10 MG PO TABS
10.0000 mg | ORAL_TABLET | Freq: Every evening | ORAL | 1 refills | Status: AC
  Filled 2024-06-24: qty 90, 90d supply, fill #0
  Filled 2024-09-23: qty 90, 90d supply, fill #1

## 2024-06-25 ENCOUNTER — Ambulatory Visit: Admitting: Urology

## 2024-06-25 VITALS — BP 123/75 | HR 76 | Ht 68.0 in | Wt 250.0 lb

## 2024-06-25 DIAGNOSIS — N99115 Postprocedural fossa navicularis urethral stricture: Secondary | ICD-10-CM

## 2024-06-25 DIAGNOSIS — N138 Other obstructive and reflux uropathy: Secondary | ICD-10-CM

## 2024-06-25 DIAGNOSIS — N401 Enlarged prostate with lower urinary tract symptoms: Secondary | ICD-10-CM

## 2024-06-25 LAB — BLADDER SCAN AMB NON-IMAGING

## 2024-06-25 NOTE — Progress Notes (Signed)
   06/25/2024 11:14 AM   [name] [name] 1955-12-04 [id number]  Reason for visit: Follow up HOLEP, recurrent UTI, PSA screening  History: Previously followed by Dr. Twylla and Clotilda Cornwall, PA for BPH/LUTS, incomplete emptying, recurrent UTIs Ultimately underwent HOLEP 02/22/2024 with removal of 20 g benign tissue Possible fossa navicularis stricture postop dilated by Sheppard Hones 04/23/2024  Physical Exam: BP 123/75 (BP Location: Left Arm, Patient Position: Sitting, Cuff Size: Normal)   Pulse 76   Ht 5' 8 (1.727 m)   Wt 250 lb (113.4 kg)   SpO2 96%   BMI 38.01 kg/m    Imaging/labs: Postop PSA August 2025 normal at 0.56 Urine culture 04/09/2024 with E. Coli HOLEP pathology benign  Today: Doing extremely well, urinating with a strong stream, frequency significantly improved, nocturia 1-2 times overnight, no incontinence PVR today normal at 50ml  Plan:   BPH/recurrent UTI: Doing extremely well after HOLEP, return precautions discussed PSA screening: PSA <1, likely no further screening needed per guideline recommendations RTC 1 year PVR   Redell JAYSON Burnet, MD  [facility] [address] [place], [place] [address] [phone]

## 2024-06-25 NOTE — Patient Instructions (Signed)

## 2024-07-09 ENCOUNTER — Other Ambulatory Visit: Payer: Self-pay

## 2024-07-10 DIAGNOSIS — H524 Presbyopia: Secondary | ICD-10-CM | POA: Diagnosis not present

## 2024-07-10 DIAGNOSIS — I10 Essential (primary) hypertension: Secondary | ICD-10-CM | POA: Diagnosis not present

## 2024-07-10 DIAGNOSIS — E119 Type 2 diabetes mellitus without complications: Secondary | ICD-10-CM | POA: Diagnosis not present

## 2024-07-10 DIAGNOSIS — H35033 Hypertensive retinopathy, bilateral: Secondary | ICD-10-CM | POA: Diagnosis not present

## 2024-07-10 DIAGNOSIS — H25813 Combined forms of age-related cataract, bilateral: Secondary | ICD-10-CM | POA: Diagnosis not present

## 2024-07-16 ENCOUNTER — Other Ambulatory Visit: Payer: Self-pay

## 2024-07-21 ENCOUNTER — Other Ambulatory Visit: Payer: Self-pay

## 2024-07-22 ENCOUNTER — Other Ambulatory Visit: Payer: Self-pay

## 2024-07-29 ENCOUNTER — Other Ambulatory Visit: Payer: Self-pay

## 2024-08-02 ENCOUNTER — Other Ambulatory Visit: Payer: Self-pay

## 2024-08-03 MED ORDER — GLIMEPIRIDE 2 MG PO TABS
2.0000 mg | ORAL_TABLET | Freq: Every day | ORAL | 1 refills | Status: AC
  Filled 2024-08-03: qty 90, 90d supply, fill #0

## 2024-08-04 ENCOUNTER — Other Ambulatory Visit: Payer: Self-pay

## 2024-08-14 ENCOUNTER — Other Ambulatory Visit: Payer: Self-pay

## 2024-08-15 ENCOUNTER — Other Ambulatory Visit: Payer: Self-pay

## 2024-08-15 MED ORDER — CYCLOBENZAPRINE HCL 10 MG PO TABS
10.0000 mg | ORAL_TABLET | Freq: Every day | ORAL | 0 refills | Status: AC
  Filled 2024-08-15: qty 10, 10d supply, fill #0

## 2024-08-15 MED ORDER — PREDNISONE 10 MG PO TABS
ORAL_TABLET | ORAL | 0 refills | Status: AC
  Filled 2024-08-15: qty 26, 8d supply, fill #0

## 2024-08-18 ENCOUNTER — Other Ambulatory Visit: Payer: Self-pay

## 2024-08-18 MED ORDER — ONETOUCH ULTRA TEST VI STRP
ORAL_STRIP | 3 refills | Status: AC
  Filled 2024-08-18: qty 200, 67d supply, fill #0

## 2024-09-05 NOTE — Progress Notes (Signed)
 Danny Maldonado                                          MRN: [id number]   09/05/2024   The [facility] Quality Team Specialist reviewed this patient medical record for the purposes of chart review for care gap closure. The following were reviewed: chart review for care gap closure-kidney health evaluation for diabetes:eGFR  and uACR.    [facility] Quality Team

## 2024-09-25 ENCOUNTER — Other Ambulatory Visit: Payer: Self-pay

## 2024-09-27 ENCOUNTER — Other Ambulatory Visit: Payer: Self-pay

## 2024-09-28 MED ORDER — BISOPROLOL-HYDROCHLOROTHIAZIDE 5-6.25 MG PO TABS
1.0000 | ORAL_TABLET | Freq: Every day | ORAL | 1 refills | Status: AC
  Filled 2024-09-28: qty 90, 90d supply, fill #0

## 2024-09-29 ENCOUNTER — Other Ambulatory Visit: Payer: Self-pay

## 2024-10-30 ENCOUNTER — Ambulatory Visit: Admitting: Urology

## 2024-12-30 ENCOUNTER — Ambulatory Visit: Admitting: Dermatology

## 2025-06-24 ENCOUNTER — Ambulatory Visit: Admitting: Urology
